# Patient Record
Sex: Female | Born: 1940
Health system: Southern US, Community
[De-identification: ages and names within clinical notes are randomized; demographics above are authoritative.]

## PROBLEM LIST (undated history)

## (undated) DIAGNOSIS — I5032 Chronic diastolic (congestive) heart failure: Secondary | ICD-10-CM

## (undated) DIAGNOSIS — K219 Gastro-esophageal reflux disease without esophagitis: Secondary | ICD-10-CM

## (undated) DIAGNOSIS — G4730 Sleep apnea, unspecified: Secondary | ICD-10-CM

## (undated) DIAGNOSIS — D72829 Elevated white blood cell count, unspecified: Secondary | ICD-10-CM

## (undated) DIAGNOSIS — E1159 Type 2 diabetes mellitus with other circulatory complications: Secondary | ICD-10-CM

## (undated) DIAGNOSIS — R6510 Systemic inflammatory response syndrome (SIRS) of non-infectious origin without acute organ dysfunction: Secondary | ICD-10-CM

## (undated) DIAGNOSIS — M25552 Pain in left hip: Secondary | ICD-10-CM

## (undated) DIAGNOSIS — I219 Acute myocardial infarction, unspecified: Secondary | ICD-10-CM

## (undated) DIAGNOSIS — N179 Acute kidney failure, unspecified: Secondary | ICD-10-CM

## (undated) DIAGNOSIS — I2510 Atherosclerotic heart disease of native coronary artery without angina pectoris: Secondary | ICD-10-CM

## (undated) DIAGNOSIS — I10 Essential (primary) hypertension: Secondary | ICD-10-CM

## (undated) DIAGNOSIS — C801 Malignant (primary) neoplasm, unspecified: Secondary | ICD-10-CM

## (undated) DIAGNOSIS — E785 Hyperlipidemia, unspecified: Secondary | ICD-10-CM

## (undated) DIAGNOSIS — M1990 Unspecified osteoarthritis, unspecified site: Secondary | ICD-10-CM

## (undated) DIAGNOSIS — I509 Heart failure, unspecified: Secondary | ICD-10-CM

## (undated) DIAGNOSIS — R404 Transient alteration of awareness: Secondary | ICD-10-CM

## (undated) DIAGNOSIS — E782 Mixed hyperlipidemia: Secondary | ICD-10-CM

## (undated) HISTORY — DX: Acute kidney failure, unspecified: N17.9

## (undated) HISTORY — DX: Transient alteration of awareness: R40.4

## (undated) HISTORY — DX: Chronic diastolic (congestive) heart failure: I50.32

## (undated) HISTORY — DX: Systemic inflammatory response syndrome (sirs) of non-infectious origin without acute organ dysfunction: R65.10

## (undated) HISTORY — DX: Mixed hyperlipidemia: E78.2

## (undated) HISTORY — DX: Elevated white blood cell count, unspecified: D72.829

## (undated) HISTORY — DX: Essential (primary) hypertension: I10

## (undated) HISTORY — DX: Hypercalcemia: E83.52

## (undated) HISTORY — DX: Type 2 diabetes mellitus with other circulatory complications: E11.59

## (undated) HISTORY — DX: Atherosclerotic heart disease of native coronary artery without angina pectoris: I25.10

## (undated) HISTORY — DX: Pain in left hip: M25.552

---
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
## 1998-08-07 HISTORY — PX: ABDOMINAL HYSTERECTOMY: SHX81

## 1999-01-12 ENCOUNTER — Other Ambulatory Visit: Admission: RE | Admit: 1999-01-12 | Discharge: 1999-01-12 | Payer: Self-pay | Admitting: Obstetrics and Gynecology

## 1999-02-01 ENCOUNTER — Other Ambulatory Visit: Admission: RE | Admit: 1999-02-01 | Discharge: 1999-02-01 | Payer: Self-pay | Admitting: Obstetrics and Gynecology

## 1999-02-01 ENCOUNTER — Encounter (INDEPENDENT_AMBULATORY_CARE_PROVIDER_SITE_OTHER): Payer: Self-pay | Admitting: Specialist

## 1999-03-03 ENCOUNTER — Encounter: Payer: Self-pay | Admitting: Obstetrics and Gynecology

## 1999-03-08 ENCOUNTER — Encounter (INDEPENDENT_AMBULATORY_CARE_PROVIDER_SITE_OTHER): Payer: Self-pay | Admitting: Specialist

## 1999-03-08 ENCOUNTER — Inpatient Hospital Stay (HOSPITAL_COMMUNITY): Admission: RE | Admit: 1999-03-08 | Discharge: 1999-03-11 | Payer: Self-pay | Admitting: Obstetrics and Gynecology

## 1999-03-08 ENCOUNTER — Encounter (INDEPENDENT_AMBULATORY_CARE_PROVIDER_SITE_OTHER): Payer: Self-pay

## 1999-10-19 ENCOUNTER — Other Ambulatory Visit: Admission: RE | Admit: 1999-10-19 | Discharge: 1999-10-19 | Payer: Self-pay | Admitting: Obstetrics and Gynecology

## 2000-04-18 ENCOUNTER — Other Ambulatory Visit: Admission: RE | Admit: 2000-04-18 | Discharge: 2000-04-18 | Payer: Self-pay | Admitting: Obstetrics and Gynecology

## 2000-06-22 ENCOUNTER — Encounter (INDEPENDENT_AMBULATORY_CARE_PROVIDER_SITE_OTHER): Payer: Self-pay | Admitting: Specialist

## 2000-06-22 ENCOUNTER — Ambulatory Visit (HOSPITAL_COMMUNITY): Admission: RE | Admit: 2000-06-22 | Discharge: 2000-06-22 | Payer: Self-pay | Admitting: Gastroenterology

## 2000-07-20 ENCOUNTER — Ambulatory Visit (HOSPITAL_COMMUNITY): Admission: RE | Admit: 2000-07-20 | Discharge: 2000-07-20 | Payer: Self-pay | Admitting: Gastroenterology

## 2000-10-15 ENCOUNTER — Other Ambulatory Visit: Admission: RE | Admit: 2000-10-15 | Discharge: 2000-10-15 | Payer: Self-pay | Admitting: Obstetrics and Gynecology

## 2001-04-29 ENCOUNTER — Other Ambulatory Visit: Admission: RE | Admit: 2001-04-29 | Discharge: 2001-04-29 | Payer: Self-pay | Admitting: Obstetrics and Gynecology

## 2001-10-17 ENCOUNTER — Other Ambulatory Visit: Admission: RE | Admit: 2001-10-17 | Discharge: 2001-10-17 | Payer: Self-pay | Admitting: Obstetrics and Gynecology

## 2002-05-16 ENCOUNTER — Other Ambulatory Visit: Admission: RE | Admit: 2002-05-16 | Discharge: 2002-05-16 | Payer: Self-pay | Admitting: Obstetrics and Gynecology

## 2002-11-18 ENCOUNTER — Other Ambulatory Visit: Admission: RE | Admit: 2002-11-18 | Discharge: 2002-11-18 | Payer: Self-pay | Admitting: Obstetrics and Gynecology

## 2003-05-27 ENCOUNTER — Other Ambulatory Visit: Admission: RE | Admit: 2003-05-27 | Discharge: 2003-05-27 | Payer: Self-pay | Admitting: Obstetrics and Gynecology

## 2003-08-08 HISTORY — PX: OTHER SURGICAL HISTORY: SHX169

## 2003-11-26 ENCOUNTER — Other Ambulatory Visit: Admission: RE | Admit: 2003-11-26 | Discharge: 2003-11-26 | Payer: Self-pay | Admitting: Obstetrics and Gynecology

## 2004-08-25 ENCOUNTER — Other Ambulatory Visit: Admission: RE | Admit: 2004-08-25 | Discharge: 2004-08-25 | Payer: Self-pay | Admitting: Obstetrics and Gynecology

## 2005-02-04 DIAGNOSIS — I219 Acute myocardial infarction, unspecified: Secondary | ICD-10-CM

## 2005-02-04 HISTORY — DX: Acute myocardial infarction, unspecified: I21.9

## 2005-11-16 ENCOUNTER — Other Ambulatory Visit: Admission: RE | Admit: 2005-11-16 | Discharge: 2005-11-16 | Payer: Self-pay | Admitting: Obstetrics and Gynecology

## 2010-08-07 HISTORY — PX: CORONARY ANGIOPLASTY: SHX604

## 2010-08-07 HISTORY — PX: OTHER SURGICAL HISTORY: SHX169

## 2010-12-23 NOTE — Procedures (Signed)
[facility]  Patient:    [name], [name]                  MRN: [id number] Adm. Date:  [id number] Disc. Date: [id number] Attending:  Deneen Harts CC:         Nadine Counts, M.D., [place], [place]   Procedure Report  PROCEDURE:  Panendoscopy with biopsy.  ENDOSCOPIST:  Griffith Citron, M.D.  INDICATION:  [age]-year-old white female undergoing upper endoscopy to evaluate symptoms of pyrosis exacerbated with citrus juice; also with frequent regurgitation and vomiting, especially after drinking water or orange juice.  DESCRIPTION OF PROCEDURE:  After reviewing the nature of the procedure with the patient including potential risks and complications and after discussing alternative methods of diagnosis and treatment, informed consent was signed.  Patient was premedicated, receiving topical anesthetic to the posterior oropharynx, followed by IV sedation totalling Versed 6 mg, Fentanyl 50 mcg, administered in divided doses prior to and during the course of the procedure.  Using an Olympus video endoscope, proximal esophagus was intubated under direct vision.  The oropharynx was normal without lesion of the epiglottis, vocal cords and piriform sinus.  The proximal, mid and distal segments of the esophagus were entirely normal.  The mucosal Z-line was distinct at 36 cm. There was no evidence of hiatal hernia.  The esophageal mucosa was noninflamed.  No evidence to suggest reflux-induced mucosal inflammation.  The gastric lumen was intubated, revealing a normal cardia and fundus.  The body and antrum were remarkable for erythematous mucosa with multiple erosions and scattered coffee-grounds exudate; in addition, the mucosa appeared somewhat mottled.  Biopsies were obtained from the angularis and prepyloric antrum for Helicobacter.  The pylorus was symmetric.  Duodenal bulb and second portion were normal.  Retroflexed view of the angularis, lesser curve,  gastric cardia and fundus revealed no additional findings; photo-documentation was obtained.  The stomach was decompressed and scope withdrawn.  Patient tolerated the procedure without difficulty, being maintained on Datascope monitor and low-flow oxygen throughout.  ASSESSMENT 1. Erosive gastritis -- primarily antral, with lesser involvement of the    gastric body.  Helicobacter biopsies obtained. 2. No evidence of gastroesophageal acid reflux disease.  RECOMMENDATION 1. Treat if Helicobacter positive. 2. Avoid NSAIDs and minimize aspirin use as much as possible. 3. Antireflux measures. 4. Protonix 40 mg, one capsule one hour prior to breakfast daily for    four to six weeks. DD:  07/20/00 TD:  07/22/00 Job: [id number]

## 2012-01-25 ENCOUNTER — Encounter (HOSPITAL_COMMUNITY): Payer: Self-pay | Admitting: Pharmacy Technician

## 2012-01-30 ENCOUNTER — Ambulatory Visit (HOSPITAL_COMMUNITY)
Admission: RE | Admit: 2012-01-30 | Discharge: 2012-01-30 | Disposition: A | Discharge status: Home / Self Care | Payer: Medicare Other | Source: Ambulatory Visit | Attending: Orthopedic Surgery | Admitting: Orthopedic Surgery

## 2012-01-30 ENCOUNTER — Encounter (HOSPITAL_COMMUNITY)
Admission: RE | Admit: 2012-01-30 | Discharge: 2012-01-30 | Disposition: A | Discharge status: Home / Self Care | Payer: Medicare Other | Source: Ambulatory Visit | Attending: Orthopedic Surgery | Admitting: Orthopedic Surgery

## 2012-01-30 ENCOUNTER — Encounter (HOSPITAL_COMMUNITY): Payer: Self-pay

## 2012-01-30 DIAGNOSIS — I2510 Atherosclerotic heart disease of native coronary artery without angina pectoris: Secondary | ICD-10-CM | POA: Insufficient documentation

## 2012-01-30 DIAGNOSIS — I10 Essential (primary) hypertension: Secondary | ICD-10-CM | POA: Insufficient documentation

## 2012-01-30 DIAGNOSIS — E119 Type 2 diabetes mellitus without complications: Secondary | ICD-10-CM | POA: Insufficient documentation

## 2012-01-30 DIAGNOSIS — Z01812 Encounter for preprocedural laboratory examination: Secondary | ICD-10-CM | POA: Insufficient documentation

## 2012-01-30 DIAGNOSIS — Z01818 Encounter for other preprocedural examination: Secondary | ICD-10-CM | POA: Insufficient documentation

## 2012-01-30 HISTORY — DX: Essential (primary) hypertension: I10

## 2012-01-30 HISTORY — DX: Sleep apnea, unspecified: G47.30

## 2012-01-30 HISTORY — DX: Atherosclerotic heart disease of native coronary artery without angina pectoris: I25.10

## 2012-01-30 HISTORY — DX: Hyperlipidemia, unspecified: E78.5

## 2012-01-30 HISTORY — DX: Acute myocardial infarction, unspecified: I21.9

## 2012-01-30 HISTORY — DX: Gastro-esophageal reflux disease without esophagitis: K21.9

## 2012-01-30 HISTORY — DX: Unspecified osteoarthritis, unspecified site: M19.90

## 2012-01-30 HISTORY — DX: Malignant (primary) neoplasm, unspecified: C80.1

## 2012-01-30 LAB — BASIC METABOLIC PANEL
BUN: 22 mg/dL (ref 6–23)
CO2: 25 mEq/L (ref 19–32)
Chloride: 102 mEq/L (ref 96–112)
Glucose, Bld: 114 mg/dL — ABNORMAL HIGH (ref 70–99)
Potassium: 4.2 mEq/L (ref 3.5–5.1)
Sodium: 141 mEq/L (ref 135–145)

## 2012-01-30 LAB — URINALYSIS, ROUTINE W REFLEX MICROSCOPIC
Leukocytes, UA: NEGATIVE
Nitrite: NEGATIVE
Specific Gravity, Urine: 1.023 (ref 1.005–1.030)
Urobilinogen, UA: 0.2 mg/dL (ref 0.0–1.0)
pH: 5.5 (ref 5.0–8.0)

## 2012-01-30 LAB — CBC
HCT: 40.2 % (ref 36.0–46.0)
Hemoglobin: 12.6 g/dL (ref 12.0–15.0)
MCHC: 31.3 g/dL (ref 30.0–36.0)
RBC: 5.02 MIL/uL (ref 3.87–5.11)
WBC: 12.2 10*3/uL — ABNORMAL HIGH (ref 4.0–10.5)

## 2012-01-30 LAB — PROTIME-INR: INR: 1.06 (ref 0.00–1.49)

## 2012-01-30 LAB — SURGICAL PCR SCREEN: Staphylococcus aureus: NEGATIVE

## 2012-01-30 LAB — APTT: aPTT: 34 seconds (ref 24–37)

## 2012-01-30 NOTE — Pre-Procedure Instructions (Signed)
PT HAS NOTE OF MEDICAL CLEARANCE WITH OFFICE NOTE FOR LEFT TKA--NOTES FROM AMY MOON, NP WITH [facility], [place]. PT HAS NOTE OF CARDIAC CLEARANCE WITH EKG AND CARDIOLOGY OFFICE NOTE 12/06/11  - FOR LEFT TKA- - FROM DR. MUNLEY WITH [facility], [place]. CBC, BMET, PT, PTT, UA AND CXR WERE DONE TODAY AT [facility] -AS PER ORDERS DR. OLIN AND ANESTHESIOLOGIST'S GUIDELINES.  T/S TO BE DRAWN THE DAY OF SURGERY. PREOP INSTRUCTIONS DISCUSSED WITH PT USING TEACH BACK METHOD.

## 2012-01-30 NOTE — Patient Instructions (Signed)
YOUR SURGERY IS SCHEDULED ON:  Monday  7/1  AT 1:40 PM  REPORT TO [facility] AT:  11:15 AM      PHONE # FOR SHORT STAY IS [phone]  DO NOT EAT ANYTHING AFTER MIDNIGHT THE NIGHT BEFORE YOUR SURGERY.  NO FOOD, NO CHEWING GUM, NO MINTS, NO CANDIES, NO CHEWING TOBACCO.  YOU MAY HAVE CLEAR LIQUID TO DRINK FROM MIDNIGHT UNTIL 7:30 AM THE DAY OF YOUR SURGERY - APPLE OR GRAPE JUICE, BLACK COFFEE, WATER.  NOTHING TO DRINK  AFTER 7:30 AM THE DAY OF YOUR SURGERY.  PLEASE TAKE THE FOLLOWING MEDICATIONS THE AM OF YOUR SURGERY WITH A FEW SIPS OF WATER:  AMLODIPINE, ISOSORBIDE, METOPROLOL, PANTOPRAZOLE    IF YOU USE INHALERS--USE YOUR INHALERS THE AM OF YOUR SURGERY AND BRING INHALERS TO THE HOSPITAL -TAKE TO SURGERY.    IF YOU ARE DIABETIC:  DO NOT TAKE ANY DIABETIC MEDICATIONS THE AM OF YOUR SURGERY.  IF YOU TAKE INSULIN IN THE EVENINGS--PLEASE ONLY TAKE 1/2 NORMAL EVENING DOSE THE NIGHT BEFORE YOUR SURGERY.  NO INSULIN THE AM OF YOUR SURGERY.  IF YOU HAVE SLEEP APNEA AND USE CPAP OR BIPAP--PLEASE BRING THE MASK --NOT THE MACHINE-NOT THE TUBING   -JUST THE MASK. DO NOT BRING VALUABLES, MONEY, CREDIT CARDS.  CONTACT LENS, DENTURES / PARTIALS, GLASSES SHOULD NOT BE WORN TO SURGERY AND IN MOST CASES-HEARING AIDS WILL NEED TO BE REMOVED.  BRING YOUR GLASSES CASE, ANY EQUIPMENT NEEDED FOR YOUR CONTACT LENS. FOR PATIENTS ADMITTED TO THE HOSPITAL--CHECK OUT TIME THE DAY OF DISCHARGE IS 11:00 AM.  ALL INPATIENT ROOMS ARE PRIVATE - WITH BATHROOM, TELEPHONE, TELEVISION AND WIFI INTERNET. IF YOU ARE BEING DISCHARGED THE SAME DAY OF YOUR SURGERY--YOU CAN NOT DRIVE YOURSELF HOME--AND SHOULD NOT GO HOME ALONE BY TAXI OR BUS.  NO DRIVING OR OPERATING MACHINERY FOR 24 HOURS FOLLOWING ANESTHESIA / PAIN MEDICATIONS.                            SPECIAL INSTRUCTIONS:  CHLORHEXIDINE SOAP SHOWER (other brand names are Betasept and Hibiclens ) PLEASE SHOWER WITH CHLORHEXIDINE THE NIGHT BEFORE YOUR SURGERY  AND THE AM OF YOUR SURGERY. DO NOT USE CHLORHEXIDINE ON YOUR FACE OR PRIVATE AREAS--YOU MAY USE YOUR NORMAL SOAP THOSE AREAS AND YOUR NORMAL SHAMPOO.  WOMEN SHOULD AVOID SHAVING UNDER ARMS AND SHAVING LEGS 48 HOURS BEFORE USING CHLORHEXIDINE TO AVOID SKIN IRRITATION.  DO NOT USE IF ALLERGIC TO CHLORHEXIDINE.  PLEASE READ OVER ANY  FACT SHEETS THAT YOU WERE GIVEN: MRSA INFORMATION, BLOOD TRANSFUSION INFORMATION, INCENTIVE SPIROMETER INFORMATION.

## 2012-01-31 NOTE — Progress Notes (Signed)
H&P performed 01/31/12 Dictation # [id number]

## 2012-02-01 NOTE — H&P (Signed)
NAME:  [place], [name]                ACCOUNT NO.:  [id number]  MEDICAL RECORD NO.:  [id number]  LOCATION:  [facility]                         FACILITY:  [facility]  PHYSICIAN:  Madlyn Frankel. Charlann Boxer, M.D.  DATE OF BIRTH:  09-15-1940  DATE OF ADMISSION:  01/30/2012 DATE OF DISCHARGE:  01/30/2012                             HISTORY & PHYSICAL  Pt Has Sleep Apnea but does not use her CPAP. Pt needs continuous pulse oximetry post op. DATE OF SURGERY:  February 05, 2012.  ADMITTING DIAGNOSIS:  End-stage osteoarthritis of the left knee.  HISTORY OF PRESENT ILLNESS:  This is a 71 year old lady with a history of end-stage osteoarthritis of the left knee that has failed conservative treatment.  After discussion of treatment, benefits, risks, and options, the patient is now scheduled for total knee arthroplasty of the left knee.  Note that the patient is not a candidate for tranexamic acid or dexamethasone and will not receive either at surgery.  Her medical doctor is Gaye Alken, nurse practitioner, and her cardiologist is Dr. Norman Herrlich.  She is given medications of aspirin, iron, Robaxin, MiraLax, and Colace for postoperative medications and she plans on going home after surgery.  PAST MEDICAL HISTORY:  Drug allergies:  None.  CURRENT MEDICATIONS: 1. Isosorbide 60 mg 1 daily. 2. Aspirin 81 mg 2 daily. 3. Metoprolol tartrate 50 mg b.i.d. 4. Losartan/hydrochlorothiazide 100/25 mg 1 daily. 5. Amlodipine 10 mg 1 daily. 6. Metformin 1000 mg b.i.d. 7. Glimepiride 4 mg b.i.d. 8. Pantoprazole 40 mg 1 daily. 9. Pravastatin 40 mg 1 at bedtime.  SERIOUS MEDICAL ILLNESSES:  Include hypertension, hyperlipidemia, coronary artery disease, diabetes, reflux, and sleep apnea.  She also has history of cardiac stent with MI.  Also medical illnesses include history of uterine cancer in 2000.  SURGERIES:  Include complete hysterectomy, cardiac stent, and knee arthroscopy with debridement.  FAMILY HISTORY:  Positive for  atrial fibrillation and hypertension.  SOCIAL HISTORY:  The patient is married.  She is retired.  She does not smoke and does not drink and again plans on going home after surgery.  REVIEW OF SYSTEMS:  CENTRAL NERVOUS SYSTEM:  Negative for headache, blurred vision, or dizziness.  PULMONARY:  Positive for sleep apnea. She is supposed to use a CPAP but has not from years.  Also history of pneumonia.  Positive for exertional shortness of breath.  Negative for PND and orthopnea.  CARDIOVASCULAR:  Negative for chest pain or palpitations.  Positive for history of MI with stent placement.  GI: Positive for reflux.  GU:  Negative for urinary tract difficulty. MUSCULOSKELETAL:  Positive as in HPI.  PHYSICAL EXAMINATION:  VITAL SIGNS:  BP 131/61, pulse 64 and regular, respirations 14. HEENT:  Head normocephalic.  Nose patent.  Ears patent.  Pupils equal, round, and reactive to light.  Throat without injection. NECK:  Supple without adenopathy.  Carotids 2+ without bruit. CHEST:  Clear to auscultation.  No rales or rhonchi.  Respirations 14. HEART:  Regular rate and rhythm at 64 beats per minute without murmur. ABDOMEN:  Soft with active bowel sounds.  No masses or organomegaly. NEUROLOGIC:  The patient is alert and oriented to time, place, and  person.  Cranial nerves II-XII grossly intact. EXTREMITIES:  Shows left knee with full extension, further flexion to 120 degrees.  Neurovascular status intact.  ASSESSMENT:  End-stage osteoarthritis, left knee.  PLAN:  Total knee arthroplasty, left knee.     Jaquelyn Bitter. Omega Slager, P.A.   ______________________________ Madlyn Frankel Charlann Boxer, M.D.    SJC/MEDQ  D:  01/31/2012  T:  01/31/2012  Job:  [id number]

## 2012-02-05 ENCOUNTER — Encounter (HOSPITAL_COMMUNITY): Payer: Self-pay | Admitting: Anesthesiology

## 2012-02-05 ENCOUNTER — Encounter (HOSPITAL_COMMUNITY)
Admission: RE | Disposition: A | Discharge status: Home Health | Payer: Self-pay | Source: Ambulatory Visit | Attending: Orthopedic Surgery

## 2012-02-05 ENCOUNTER — Inpatient Hospital Stay (HOSPITAL_COMMUNITY)
Admission: RE | Admit: 2012-02-05 | Discharge: 2012-02-07 | DRG: 470 | Disposition: A | Discharge status: Home Health | Payer: Medicare Other | Source: Ambulatory Visit | Attending: Orthopedic Surgery | Admitting: Orthopedic Surgery

## 2012-02-05 ENCOUNTER — Encounter (HOSPITAL_COMMUNITY): Payer: Self-pay | Admitting: *Deleted

## 2012-02-05 ENCOUNTER — Ambulatory Visit (HOSPITAL_COMMUNITY): Payer: Medicare Other | Admitting: Anesthesiology

## 2012-02-05 DIAGNOSIS — K219 Gastro-esophageal reflux disease without esophagitis: Secondary | ICD-10-CM | POA: Diagnosis present

## 2012-02-05 DIAGNOSIS — Z9861 Coronary angioplasty status: Secondary | ICD-10-CM

## 2012-02-05 DIAGNOSIS — I10 Essential (primary) hypertension: Secondary | ICD-10-CM | POA: Diagnosis present

## 2012-02-05 DIAGNOSIS — I252 Old myocardial infarction: Secondary | ICD-10-CM

## 2012-02-05 DIAGNOSIS — E785 Hyperlipidemia, unspecified: Secondary | ICD-10-CM | POA: Diagnosis present

## 2012-02-05 DIAGNOSIS — M1710 Unilateral primary osteoarthritis, unspecified knee: Principal | ICD-10-CM | POA: Diagnosis present

## 2012-02-05 DIAGNOSIS — I2510 Atherosclerotic heart disease of native coronary artery without angina pectoris: Secondary | ICD-10-CM | POA: Diagnosis present

## 2012-02-05 DIAGNOSIS — G4730 Sleep apnea, unspecified: Secondary | ICD-10-CM | POA: Diagnosis present

## 2012-02-05 DIAGNOSIS — E119 Type 2 diabetes mellitus without complications: Secondary | ICD-10-CM | POA: Diagnosis present

## 2012-02-05 DIAGNOSIS — Z96659 Presence of unspecified artificial knee joint: Secondary | ICD-10-CM

## 2012-02-05 HISTORY — PX: TOTAL KNEE ARTHROPLASTY: SHX125

## 2012-02-05 LAB — TYPE AND SCREEN: ABO/RH(D): O POS

## 2012-02-05 LAB — GLUCOSE, CAPILLARY: Glucose-Capillary: 190 mg/dL — ABNORMAL HIGH (ref 70–99)

## 2012-02-05 LAB — ABO/RH: ABO/RH(D): O POS

## 2012-02-05 SURGERY — ARTHROPLASTY, KNEE, TOTAL
Anesthesia: Spinal | Site: Knee | Laterality: Left | Wound class: Clean

## 2012-02-05 MED ORDER — FERROUS SULFATE 325 (65 FE) MG PO TABS
325.0000 mg | ORAL_TABLET | Freq: Three times a day (TID) | ORAL | Status: DC
  Administered 2012-02-05 – 2012-02-07 (×5): 325 mg via ORAL
  Filled 2012-02-05 (×8): qty 1

## 2012-02-05 MED ORDER — CEFAZOLIN SODIUM 1-5 GM-% IV SOLN: 1.0000 g | INTRAVENOUS | Status: DC

## 2012-02-05 MED ORDER — FLEET ENEMA 7-19 GM/118ML RE ENEM: 1.0000 | ENEMA | Freq: Once | RECTAL | Status: AC | PRN

## 2012-02-05 MED ORDER — MENTHOL 3 MG MT LOZG: 1.0000 | LOZENGE | OROMUCOSAL | Status: DC | PRN

## 2012-02-05 MED ORDER — CEFAZOLIN SODIUM 1-5 GM-% IV SOLN
INTRAVENOUS | Status: DC | PRN
  Administered 2012-02-05: 1 g via INTRAVENOUS

## 2012-02-05 MED ORDER — ONDANSETRON HCL 4 MG/2ML IJ SOLN
INTRAMUSCULAR | Status: DC | PRN
  Administered 2012-02-05: 4 mg via INTRAVENOUS

## 2012-02-05 MED ORDER — MIDAZOLAM HCL 5 MG/5ML IJ SOLN
INTRAMUSCULAR | Status: DC | PRN
  Administered 2012-02-05 (×2): 1 mg via INTRAVENOUS
  Administered 2012-02-05: 2 mg via INTRAVENOUS

## 2012-02-05 MED ORDER — BUPIVACAINE HCL 0.75 % IJ SOLN
INTRAMUSCULAR | Status: DC | PRN
  Administered 2012-02-05: 15 mg via INTRATHECAL

## 2012-02-05 MED ORDER — ACETAMINOPHEN 10 MG/ML IV SOLN
INTRAVENOUS | Status: DC | PRN
  Administered 2012-02-05: 1000 mg via INTRAVENOUS

## 2012-02-05 MED ORDER — HYDROMORPHONE HCL PF 1 MG/ML IJ SOLN
0.5000 mg | INTRAMUSCULAR | Status: DC | PRN
  Administered 2012-02-07 (×2): 1 mg via INTRAVENOUS
  Filled 2012-02-05 (×2): qty 1

## 2012-02-05 MED ORDER — POLYETHYLENE GLYCOL 3350 17 G PO PACK
17.0000 g | PACK | Freq: Two times a day (BID) | ORAL | Status: DC
  Administered 2012-02-05 – 2012-02-07 (×3): 17 g via ORAL

## 2012-02-05 MED ORDER — ONDANSETRON HCL 4 MG/2ML IJ SOLN: 4.0000 mg | Freq: Four times a day (QID) | INTRAMUSCULAR | Status: DC | PRN

## 2012-02-05 MED ORDER — DOCUSATE SODIUM 100 MG PO CAPS
100.0000 mg | ORAL_CAPSULE | Freq: Two times a day (BID) | ORAL | Status: DC
  Administered 2012-02-05 – 2012-02-07 (×4): 100 mg via ORAL

## 2012-02-05 MED ORDER — HYDROCHLOROTHIAZIDE 25 MG PO TABS
25.0000 mg | ORAL_TABLET | Freq: Every day | ORAL | Status: DC
  Administered 2012-02-06 – 2012-02-07 (×2): 25 mg via ORAL
  Filled 2012-02-05 (×2): qty 1

## 2012-02-05 MED ORDER — SIMVASTATIN 20 MG PO TABS
20.0000 mg | ORAL_TABLET | Freq: Every day | ORAL | Status: DC
  Administered 2012-02-06: 20 mg via ORAL
  Filled 2012-02-05 (×2): qty 1

## 2012-02-05 MED ORDER — KETOROLAC TROMETHAMINE 30 MG/ML IJ SOLN
INTRAMUSCULAR | Status: DC | PRN
  Administered 2012-02-05: 30 mg

## 2012-02-05 MED ORDER — LACTATED RINGERS IV SOLN
INTRAVENOUS | Status: DC
  Administered 2012-02-05: 1000 mL via INTRAVENOUS

## 2012-02-05 MED ORDER — ASPIRIN EC 81 MG PO TBEC
162.0000 mg | DELAYED_RELEASE_TABLET | Freq: Every day | ORAL | Status: DC
  Administered 2012-02-05 – 2012-02-07 (×3): 162 mg via ORAL
  Filled 2012-02-05 (×3): qty 2

## 2012-02-05 MED ORDER — CHLORHEXIDINE GLUCONATE 4 % EX LIQD
60.0000 mL | Freq: Once | CUTANEOUS | Status: DC
  Filled 2012-02-05: qty 60

## 2012-02-05 MED ORDER — HYDROMORPHONE HCL PF 1 MG/ML IJ SOLN
INTRAMUSCULAR | Status: DC | PRN
  Administered 2012-02-05 (×4): 0.5 mg via INTRAVENOUS

## 2012-02-05 MED ORDER — BUPIVACAINE-EPINEPHRINE PF 0.25-1:200000 % IJ SOLN
INTRAMUSCULAR | Status: AC
  Filled 2012-02-05: qty 30

## 2012-02-05 MED ORDER — PANTOPRAZOLE SODIUM 40 MG PO TBEC
40.0000 mg | DELAYED_RELEASE_TABLET | Freq: Every day | ORAL | Status: DC
  Administered 2012-02-06 – 2012-02-07 (×2): 40 mg via ORAL
  Filled 2012-02-05 (×4): qty 1

## 2012-02-05 MED ORDER — ACETAMINOPHEN 650 MG RE SUPP: 650.0000 mg | Freq: Four times a day (QID) | RECTAL | Status: DC | PRN

## 2012-02-05 MED ORDER — PROMETHAZINE HCL 25 MG/ML IJ SOLN: 6.2500 mg | INTRAMUSCULAR | Status: DC | PRN

## 2012-02-05 MED ORDER — ISOSORBIDE MONONITRATE ER 60 MG PO TB24
60.0000 mg | ORAL_TABLET | Freq: Every day | ORAL | Status: DC
  Administered 2012-02-06 – 2012-02-07 (×2): 60 mg via ORAL
  Filled 2012-02-05 (×2): qty 1

## 2012-02-05 MED ORDER — PROPOFOL 10 MG/ML IV BOLUS
INTRAVENOUS | Status: DC | PRN
  Administered 2012-02-05: 120 mg via INTRAVENOUS

## 2012-02-05 MED ORDER — CEFAZOLIN SODIUM 1-5 GM-% IV SOLN
1.0000 g | Freq: Four times a day (QID) | INTRAVENOUS | Status: AC
  Administered 2012-02-05 – 2012-02-06 (×2): 1 g via INTRAVENOUS
  Filled 2012-02-05 (×2): qty 50

## 2012-02-05 MED ORDER — LACTATED RINGERS IV SOLN
INTRAVENOUS | Status: DC | PRN
  Administered 2012-02-05 (×2): via INTRAVENOUS

## 2012-02-05 MED ORDER — ACETAMINOPHEN 10 MG/ML IV SOLN
INTRAVENOUS | Status: AC
  Filled 2012-02-05: qty 100

## 2012-02-05 MED ORDER — BUPIVACAINE-EPINEPHRINE PF 0.25-1:200000 % IJ SOLN
INTRAMUSCULAR | Status: DC | PRN
  Administered 2012-02-05: 60 mL

## 2012-02-05 MED ORDER — AMLODIPINE BESYLATE 10 MG PO TABS
10.0000 mg | ORAL_TABLET | Freq: Every day | ORAL | Status: DC
  Administered 2012-02-05 – 2012-02-07 (×3): 10 mg via ORAL
  Filled 2012-02-05 (×4): qty 1

## 2012-02-05 MED ORDER — METHOCARBAMOL 500 MG PO TABS
500.0000 mg | ORAL_TABLET | Freq: Four times a day (QID) | ORAL | Status: DC | PRN
  Administered 2012-02-06 – 2012-02-07 (×2): 500 mg via ORAL
  Filled 2012-02-05 (×2): qty 1

## 2012-02-05 MED ORDER — GLIMEPIRIDE 4 MG PO TABS
4.0000 mg | ORAL_TABLET | Freq: Two times a day (BID) | ORAL | Status: DC
  Administered 2012-02-05 – 2012-02-07 (×4): 4 mg via ORAL
  Filled 2012-02-05 (×5): qty 1

## 2012-02-05 MED ORDER — METOCLOPRAMIDE HCL 10 MG PO TABS: 5.0000 mg | ORAL_TABLET | Freq: Three times a day (TID) | ORAL | Status: DC | PRN

## 2012-02-05 MED ORDER — KETOROLAC TROMETHAMINE 15 MG/ML IJ SOLN
7.5000 mg | Freq: Four times a day (QID) | INTRAMUSCULAR | Status: AC
  Administered 2012-02-05 – 2012-02-06 (×3): 7.5 mg via INTRAVENOUS
  Filled 2012-02-05 (×4): qty 1

## 2012-02-05 MED ORDER — METOPROLOL TARTRATE 50 MG PO TABS
50.0000 mg | ORAL_TABLET | Freq: Two times a day (BID) | ORAL | Status: DC
  Administered 2012-02-05 – 2012-02-07 (×4): 50 mg via ORAL
  Filled 2012-02-05 (×5): qty 1

## 2012-02-05 MED ORDER — HYDROMORPHONE HCL PF 1 MG/ML IJ SOLN: 0.2500 mg | INTRAMUSCULAR | Status: DC | PRN

## 2012-02-05 MED ORDER — CEFAZOLIN SODIUM 1-5 GM-% IV SOLN
INTRAVENOUS | Status: AC
  Filled 2012-02-05: qty 50

## 2012-02-05 MED ORDER — PHENYLEPHRINE HCL 10 MG/ML IJ SOLN
INTRAMUSCULAR | Status: DC | PRN
  Administered 2012-02-05: 40 ug via INTRAVENOUS
  Administered 2012-02-05: 20 ug via INTRAVENOUS

## 2012-02-05 MED ORDER — ACETAMINOPHEN 325 MG PO TABS: 650.0000 mg | ORAL_TABLET | Freq: Four times a day (QID) | ORAL | Status: DC | PRN

## 2012-02-05 MED ORDER — PHENOL 1.4 % MT LIQD: 1.0000 | OROMUCOSAL | Status: DC | PRN

## 2012-02-05 MED ORDER — METHOCARBAMOL 100 MG/ML IJ SOLN
500.0000 mg | Freq: Four times a day (QID) | INTRAVENOUS | Status: DC | PRN
  Filled 2012-02-05: qty 5

## 2012-02-05 MED ORDER — SODIUM CHLORIDE 0.9 % IV SOLN
INTRAVENOUS | Status: AC
  Administered 2012-02-06: 01:00:00 via INTRAVENOUS
  Filled 2012-02-05 (×4): qty 1000

## 2012-02-05 MED ORDER — DIPHENHYDRAMINE HCL 12.5 MG/5ML PO ELIX: 12.5000 mg | ORAL_SOLUTION | ORAL | Status: DC | PRN

## 2012-02-05 MED ORDER — HYDROCODONE-ACETAMINOPHEN 7.5-325 MG PO TABS
1.0000 | ORAL_TABLET | ORAL | Status: DC
  Administered 2012-02-05 – 2012-02-06 (×4): 1 via ORAL
  Administered 2012-02-06: 2 via ORAL
  Administered 2012-02-06 (×2): 1 via ORAL
  Administered 2012-02-07: 2 via ORAL
  Administered 2012-02-07: 1 via ORAL
  Filled 2012-02-05 (×5): qty 1
  Filled 2012-02-05: qty 2
  Filled 2012-02-05 (×2): qty 1
  Filled 2012-02-05: qty 2

## 2012-02-05 MED ORDER — ONDANSETRON HCL 4 MG PO TABS
4.0000 mg | ORAL_TABLET | Freq: Four times a day (QID) | ORAL | Status: DC | PRN
  Administered 2012-02-07: 4 mg via ORAL
  Filled 2012-02-05: qty 1

## 2012-02-05 MED ORDER — KETOROLAC TROMETHAMINE 30 MG/ML IJ SOLN
INTRAMUSCULAR | Status: AC
  Filled 2012-02-05: qty 1

## 2012-02-05 MED ORDER — METFORMIN HCL 500 MG PO TABS
1000.0000 mg | ORAL_TABLET | Freq: Two times a day (BID) | ORAL | Status: DC
  Administered 2012-02-06 – 2012-02-07 (×3): 1000 mg via ORAL
  Filled 2012-02-05 (×5): qty 2

## 2012-02-05 MED ORDER — RIVAROXABAN 10 MG PO TABS
10.0000 mg | ORAL_TABLET | Freq: Every day | ORAL | Status: DC
  Administered 2012-02-06 – 2012-02-07 (×2): 10 mg via ORAL
  Filled 2012-02-05 (×3): qty 1

## 2012-02-05 MED ORDER — ALUM & MAG HYDROXIDE-SIMETH 200-200-20 MG/5ML PO SUSP: 30.0000 mL | ORAL | Status: DC | PRN

## 2012-02-05 MED ORDER — METOCLOPRAMIDE HCL 5 MG/ML IJ SOLN: 5.0000 mg | Freq: Three times a day (TID) | INTRAMUSCULAR | Status: DC | PRN

## 2012-02-05 MED ORDER — LOSARTAN POTASSIUM 50 MG PO TABS
100.0000 mg | ORAL_TABLET | Freq: Every day | ORAL | Status: DC
  Administered 2012-02-06 – 2012-02-07 (×2): 100 mg via ORAL
  Filled 2012-02-05 (×2): qty 2

## 2012-02-05 MED ORDER — PROPOFOL 10 MG/ML IV EMUL
INTRAVENOUS | Status: DC | PRN
  Administered 2012-02-05: 100 ug/kg/min via INTRAVENOUS

## 2012-02-05 MED ORDER — LOSARTAN POTASSIUM-HCTZ 100-25 MG PO TABS: 1.0000 | ORAL_TABLET | Freq: Every day | ORAL | Status: DC

## 2012-02-05 SURGICAL SUPPLY — 57 items
BAG ZIPLOCK 12X15 (MISCELLANEOUS) ×2 IMPLANT
BANDAGE ELASTIC 6 VELCRO ST LF (GAUZE/BANDAGES/DRESSINGS) ×2 IMPLANT
BANDAGE ESMARK 6X9 LF (GAUZE/BANDAGES/DRESSINGS) ×1 IMPLANT
BLADE SAW SGTL 13.0X1.19X90.0M (BLADE) ×2 IMPLANT
BNDG ESMARK 6X9 LF (GAUZE/BANDAGES/DRESSINGS) ×2
BONE CEMENT GENTAMICIN (Cement) ×4 IMPLANT
BOWL SMART MIX CTS (DISPOSABLE) ×2 IMPLANT
CEMENT BONE GENTAMICIN 40 (Cement) ×2 IMPLANT
CLOTH BEACON ORANGE TIMEOUT ST (SAFETY) ×2 IMPLANT
CUFF TOURN SGL QUICK 34 (TOURNIQUET CUFF) ×2
CUFF TRNQT CYL 34X4X40X1 (TOURNIQUET CUFF) ×1 IMPLANT
DECANTER SPIKE VIAL GLASS SM (MISCELLANEOUS) ×4 IMPLANT
DERMABOND ADVANCED (GAUZE/BANDAGES/DRESSINGS) ×1
DERMABOND ADVANCED .7 DNX12 (GAUZE/BANDAGES/DRESSINGS) ×1 IMPLANT
DRAPE EXTREMITY T 121X128X90 (DRAPE) ×2 IMPLANT
DRAPE POUCH INSTRU U-SHP 10X18 (DRAPES) ×2 IMPLANT
DRAPE U-SHAPE 47X51 STRL (DRAPES) ×2 IMPLANT
DRSG AQUACEL AG ADV 3.5X10 (GAUZE/BANDAGES/DRESSINGS) ×2 IMPLANT
DRSG TEGADERM 4X4.75 (GAUZE/BANDAGES/DRESSINGS) ×2 IMPLANT
DRSG TEGADERM 8X12 (GAUZE/BANDAGES/DRESSINGS) ×2 IMPLANT
DURAPREP 26ML APPLICATOR (WOUND CARE) ×2 IMPLANT
ELECT REM PT RETURN 9FT ADLT (ELECTROSURGICAL) ×2
ELECTRODE REM PT RTRN 9FT ADLT (ELECTROSURGICAL) ×1 IMPLANT
EVACUATOR 1/8 PVC DRAIN (DRAIN) ×2 IMPLANT
FACESHIELD LNG OPTICON STERILE (SAFETY) ×10 IMPLANT
GAUZE SPONGE 2X2 8PLY STRL LF (GAUZE/BANDAGES/DRESSINGS) ×1 IMPLANT
GLOVE BIOGEL PI IND STRL 7.5 (GLOVE) ×1 IMPLANT
GLOVE BIOGEL PI IND STRL 8 (GLOVE) ×1 IMPLANT
GLOVE BIOGEL PI INDICATOR 7.5 (GLOVE) ×1
GLOVE BIOGEL PI INDICATOR 8 (GLOVE) ×1
GLOVE ECLIPSE 8.0 STRL XLNG CF (GLOVE) ×2 IMPLANT
GLOVE ORTHO TXT STRL SZ7.5 (GLOVE) ×4 IMPLANT
GOWN BRE IMP PREV XXLGXLNG (GOWN DISPOSABLE) ×2 IMPLANT
GOWN STRL NON-REIN LRG LVL3 (GOWN DISPOSABLE) ×2 IMPLANT
HANDPIECE INTERPULSE COAX TIP (DISPOSABLE) ×1
IMMOBILIZER KNEE 20 (SOFTGOODS) ×2
IMMOBILIZER KNEE 20 THIGH 36 (SOFTGOODS) ×1 IMPLANT
KIT BASIN OR (CUSTOM PROCEDURE TRAY) ×2 IMPLANT
MANIFOLD NEPTUNE II (INSTRUMENTS) ×2 IMPLANT
NDL SAFETY ECLIPSE 18X1.5 (NEEDLE) ×1 IMPLANT
NEEDLE HYPO 18GX1.5 SHARP (NEEDLE) ×2
NS IRRIG 1000ML POUR BTL (IV SOLUTION) ×4 IMPLANT
PACK TOTAL JOINT (CUSTOM PROCEDURE TRAY) ×2 IMPLANT
POSITIONER SURGICAL ARM (MISCELLANEOUS) ×2 IMPLANT
SET HNDPC FAN SPRY TIP SCT (DISPOSABLE) ×1 IMPLANT
SET PAD KNEE POSITIONER (MISCELLANEOUS) ×2 IMPLANT
SPONGE GAUZE 2X2 STER 10/PKG (GAUZE/BANDAGES/DRESSINGS) ×1
SUCTION FRAZIER 12FR DISP (SUCTIONS) ×2 IMPLANT
SUT MNCRL AB 4-0 PS2 18 (SUTURE) ×2 IMPLANT
SUT VIC AB 1 CT1 36 (SUTURE) ×6 IMPLANT
SUT VIC AB 2-0 CT1 27 (SUTURE) ×6
SUT VIC AB 2-0 CT1 TAPERPNT 27 (SUTURE) ×3 IMPLANT
SYR 50ML LL SCALE MARK (SYRINGE) ×2 IMPLANT
TOWEL OR 17X26 10 PK STRL BLUE (TOWEL DISPOSABLE) ×4 IMPLANT
TRAY FOLEY CATH 14FRSI W/METER (CATHETERS) ×2 IMPLANT
WATER STERILE IRR 1500ML POUR (IV SOLUTION) ×2 IMPLANT
WRAP KNEE MAXI GEL POST OP (GAUZE/BANDAGES/DRESSINGS) ×2 IMPLANT

## 2012-02-05 NOTE — Interval H&P Note (Signed)
History and Physical Interval Note:  02/05/2012 3:15 PM  Lindsey Frost  has presented today for surgery, with the diagnosis of osteoarthritis left knee  The various methods of treatment have been discussed with the patient and family. After consideration of risks, benefits and other options for treatment, the patient has consented to  Procedure(s) (LRB): LEFT TOTAL KNEE ARTHROPLASTY (Left) as a surgical intervention .  The patient's history has been reviewed, patient examined, no change in status, stable for surgery.  I have reviewed the patients' chart and labs.  Questions were answered to the patient's satisfaction.     [name]

## 2012-02-05 NOTE — Anesthesia Procedure Notes (Addendum)
Spinal  Patient location during procedure: OR Start time: 02/05/2012 3:35 PM End time: 02/05/2012 3:45 PM Staffing Anesthesiologist: Ronelle Nigh L Performed by: anesthesiologist  Preanesthetic Checklist Completed: patient identified, site marked, surgical consent, pre-op evaluation, timeout performed, IV checked, risks and benefits discussed and monitors and equipment checked Spinal Block Patient position: sitting Prep: Betadine Patient monitoring: heart rate, continuous pulse ox and blood pressure Approach: right paramedian Location: L3-4 Injection technique: single-shot Needle Needle type: Spinocan  Needle gauge: 22 G Needle length: 9 cm Assessment Sensory level: T6 Additional Notes Expiration date of kit checked and confirmed. Patient tolerated procedure well, without complications.

## 2012-02-05 NOTE — Transfer of Care (Signed)
Immediate Anesthesia Transfer of Care Note  Patient: Lindsey Frost  Procedure(s) Performed: Procedure(s) (LRB): TOTAL KNEE ARTHROPLASTY (Left)  Patient Location: PACU  Anesthesia Type: General and Regional  Level of Consciousness: awake, alert , oriented and patient cooperative  Airway & Oxygen Therapy: Patient Spontanous Breathing and Patient connected to face mask oxygen  Post-op Assessment: Report given to PACU RN and Post -op Vital signs reviewed and stable  Post vital signs: Reviewed and stable  Complications: No apparent anesthesia complications

## 2012-02-05 NOTE — Anesthesia Preprocedure Evaluation (Signed)
Anesthesia Evaluation  Patient identified by MRN, date of birth, ID band Patient awake    Reviewed: Allergy & Precautions, H&P , NPO status , Patient's Chart, lab work & pertinent test results  Airway Mallampati: II TM Distance: <3 FB Neck ROM: Full    Dental No notable dental hx.    Pulmonary sleep apnea ,  breath sounds clear to auscultation  Pulmonary exam normal       Cardiovascular hypertension, Pt. on medications + CAD, + Past MI and + Cardiac Stents Rhythm:Regular Rate:Normal     Neuro/Psych negative neurological ROS  negative psych ROS   GI/Hepatic negative GI ROS, Neg liver ROS,   Endo/Other  Diabetes mellitus-, Oral Hypoglycemic Agents  Renal/GU negative Renal ROS  negative genitourinary   Musculoskeletal negative musculoskeletal ROS (+)   Abdominal   Peds negative pediatric ROS (+)  Hematology negative hematology ROS (+)   Anesthesia Other Findings   Reproductive/Obstetrics negative OB ROS                           Anesthesia Physical Anesthesia Plan  ASA: III  Anesthesia Plan: Spinal   Post-op Pain Management:    Induction:   Airway Management Planned: Natural Airway  Additional Equipment:   Intra-op Plan:   Post-operative Plan:   Informed Consent: I have reviewed the patients History and Physical, chart, labs and discussed the procedure including the risks, benefits and alternatives for the proposed anesthesia with the patient or authorized representative who has indicated his/her understanding and acceptance.   Dental advisory given  Plan Discussed with: CRNA  Anesthesia Plan Comments:         Anesthesia Quick Evaluation

## 2012-02-05 NOTE — H&P (View-Only) (Signed)
NAME:  Lindsey Frost, Lindsey Frost                ACCOUNT NO.:  [id number]  MEDICAL RECORD NO.:  [id number]  LOCATION:  [facility]                         FACILITY:  [facility]  PHYSICIAN:  Matthew D. Olin, M.D.  DATE OF BIRTH:  04/29/1941  DATE OF ADMISSION:  01/30/2012 DATE OF DISCHARGE:  01/30/2012                             HISTORY & PHYSICAL  Pt Has Sleep Apnea but does not use her CPAP. Pt needs continuous pulse oximetry post op. DATE OF SURGERY:  February 05, 2012.  ADMITTING DIAGNOSIS:  End-stage osteoarthritis of the left knee.  HISTORY OF PRESENT ILLNESS:  This is a 71-year-old lady with a history of end-stage osteoarthritis of the left knee that has failed conservative treatment.  After discussion of treatment, benefits, risks, and options, the patient is now scheduled for total knee arthroplasty of the left knee.  Note that the patient is not a candidate for tranexamic acid or dexamethasone and will not receive either at surgery.  Her medical doctor is Amy Moon, nurse practitioner, and her cardiologist is Dr. Brian Munley.  She is given medications of aspirin, iron, Robaxin, MiraLax, and Colace for postoperative medications and she plans on going home after surgery.  PAST MEDICAL HISTORY:  Drug allergies:  None.  CURRENT MEDICATIONS: 1. Isosorbide 60 mg 1 daily. 2. Aspirin 81 mg 2 daily. 3. Metoprolol tartrate 50 mg b.i.d. 4. Losartan/hydrochlorothiazide 100/25 mg 1 daily. 5. Amlodipine 10 mg 1 daily. 6. Metformin 1000 mg b.i.d. 7. Glimepiride 4 mg b.i.d. 8. Pantoprazole 40 mg 1 daily. 9. Pravastatin 40 mg 1 at bedtime.  SERIOUS MEDICAL ILLNESSES:  Include hypertension, hyperlipidemia, coronary artery disease, diabetes, reflux, and sleep apnea.  She also has history of cardiac stent with MI.  Also medical illnesses include history of uterine cancer in 2000.  SURGERIES:  Include complete hysterectomy, cardiac stent, and knee arthroscopy with debridement.  FAMILY HISTORY:  Positive for  atrial fibrillation and hypertension.  SOCIAL HISTORY:  The patient is married.  She is retired.  She does not smoke and does not drink and again plans on going home after surgery.  REVIEW OF SYSTEMS:  CENTRAL NERVOUS SYSTEM:  Negative for headache, blurred vision, or dizziness.  PULMONARY:  Positive for sleep apnea. She is supposed to use a CPAP but has not from years.  Also history of pneumonia.  Positive for exertional shortness of breath.  Negative for PND and orthopnea.  CARDIOVASCULAR:  Negative for chest pain or palpitations.  Positive for history of MI with stent placement.  GI: Positive for reflux.  GU:  Negative for urinary tract difficulty. MUSCULOSKELETAL:  Positive as in HPI.  PHYSICAL EXAMINATION:  VITAL SIGNS:  BP 131/61, pulse 64 and regular, respirations 14. HEENT:  Head normocephalic.  Nose patent.  Ears patent.  Pupils equal, round, and reactive to light.  Throat without injection. NECK:  Supple without adenopathy.  Carotids 2+ without bruit. CHEST:  Clear to auscultation.  No rales or rhonchi.  Respirations 14. HEART:  Regular rate and rhythm at 64 beats per minute without murmur. ABDOMEN:  Soft with active bowel sounds.  No masses or organomegaly. NEUROLOGIC:  The patient is alert and oriented to time, place, and   person.  Cranial nerves II-XII grossly intact. EXTREMITIES:  Shows left knee with full extension, further flexion to 120 degrees.  Neurovascular status intact.  ASSESSMENT:  End-stage osteoarthritis, left knee.  PLAN:  Total knee arthroplasty, left knee.     Deziya Amero J. Erman Thum, P.A.   ______________________________ Matthew D. Olin, M.D.    SJC/MEDQ  D:  01/31/2012  T:  01/31/2012  Job:  [id number] 

## 2012-02-05 NOTE — Anesthesia Postprocedure Evaluation (Signed)
  Anesthesia Post-op Note  Patient: Lindsey Frost  Procedure(s) Performed: Procedure(s) (LRB): TOTAL KNEE ARTHROPLASTY (Left)  Patient Location: PACU  Anesthesia Type: Spinal  Level of Consciousness: awake and alert   Airway and Oxygen Therapy: Patient Spontanous Breathing  Post-op Pain: mild  Post-op Assessment: Post-op Vital signs reviewed, Patient's Cardiovascular Status Stable, Respiratory Function Stable, Patent Airway and No signs of Nausea or vomiting  Post-op Vital Signs: stable  Complications: No apparent anesthesia complications

## 2012-02-05 NOTE — Op Note (Signed)
NAME:  Lindsey Frost                      MEDICAL RECORD NO.:  [id number]                             FACILITY:  [facility]      PHYSICIAN:  Madlyn Frankel. Charlann Boxer, M.D.  DATE OF BIRTH:  September 27, 1940      DATE OF PROCEDURE:  02/05/2012                                     OPERATIVE REPORT         PREOPERATIVE DIAGNOSIS:  Left knee osteoarthritis.      POSTOPERATIVE DIAGNOSIS:  Left knee osteoarthritis.      FINDINGS:  The patient was noted to have complete loss of cartilage and   bone-on-bone arthritis with associated osteophytes in the medial and patellofemoral compartments of   the knee with a significant synovitis and associated effusion.      PROCEDURE:  Left total knee replacement.      COMPONENTS USED:  DePuy rotating platform posterior stabilized knee   system, a size 2.5 femur, 2.5 tibia, 10 mm insert, and 35 patellar   button.      SURGEON:  Madlyn Frankel. Charlann Boxer, M.D.      ASSISTANT:  Lanney Gins, PA-C.      ANESTHESIA:  Spinal.      SPECIMENS:  None.      COMPLICATION:  None.      DRAINS:  One Hemovac.  EBL: 100cc      TOURNIQUET TIME:   Total Tourniquet Time Documented: Thigh (Left) - 38 minutes .      The patient was stable to the recovery room.      INDICATION FOR PROCEDURE:  Lindsey Kristeen Lantz is a 71 y.o. female patient of   mine.  The patient had been seen, evaluated, and treated conservatively in the   office with medication, activity modification, and injections.  The patient had   radiographic changes of bone-on-bone arthritis with endplate sclerosis and osteophytes noted.      The patient failed conservative measures including medication, injections, and activity modification, and at this point was ready for more definitive measures.   Based on the radiographic changes and failed conservative measures, the patient   decided to proceed with total knee replacement.  Risks of infection,   DVT, component failure, need for revision surgery, postop course, and    expectations were all   discussed and reviewed.  Consent was obtained for benefit of pain   relief.      PROCEDURE IN DETAIL:  The patient was brought to the operative theater.   Once adequate anesthesia, preoperative antibiotics, 2 gm of Ancef administered, the patient was positioned supine with the left thigh tourniquet placed.  The  left lower extremity was prepped and draped in sterile fashion.  A time-   out was performed identifying the patient, planned procedure, and   extremity.      The left lower extremity was placed in the [facility] leg holder.  The leg was   exsanguinated, tourniquet elevated to 250 mmHg.  A midline incision was   made followed by median parapatellar arthrotomy.  Following initial   exposure, attention was first directed to the patella.  Precut  measurement was noted to be 21 mm.  I resected down to 14 mm and used a   35 patellar button to restore patellar height as well as cover the cut   surface.      The lug holes were drilled and a metal shim was placed to protect the   patella from retractors and saw blades.      At this point, attention was now directed to the femur.  The femoral   canal was opened with a drill, irrigated to try to prevent fat emboli.  An   intramedullary rod was passed at 3 degrees valgus, 10 mm of bone was   resected off the distal femur.  Following this resection, the tibia was   subluxated anteriorly.  Using the extramedullary guide, 10 mm of bone was resected off   the proximal lateral tibia.  We confirmed the gap would be   stable medially and laterally with a 10 mm insert as well as confirmed   the cut was perpendicular in the coronal plane, checking with an alignment rod.      Once this was done, I sized the femur to be a size 2.5 in the anterior-   posterior dimension, chose a standard component based on medial and   lateral dimension.  The size 2.5 rotation block was then pinned in   position anterior referenced using the  C-clamp to set rotation.  The   anterior, posterior, and  chamfer cuts were made without difficulty nor   notching making certain that I was along the anterior cortex to help   with flexion gap stability.      The final box cut was made off the lateral aspect of distal femur.      At this point, the tibia was sized to be a size 2.5, the size 2.5 tray was   then pinned in position through the medial third of the tubercle,   drilled, and keel punched.  Trial reduction was now carried with a 2.5 femur,  2.5 tibia, a 10 mm insert, and the 35 patella botton.  The knee was brought to   extension, full extension with good flexion stability with the patella   tracking through the trochlea without application of pressure.  Given   all these findings, the trial components removed.  Final components were   opened and cement was mixed.  The knee was irrigated with normal saline   solution and pulse lavage.  The synovial lining was   then injected with 0.25% Marcaine with epinephrine and 1 cc of Toradol,   total of 61 cc.      The knee was irrigated.  Final implants were then cemented onto clean and   dried cut surfaces of bone with the knee brought to extension with a 10   mm trial insert.      Once the cement had fully cured, the excess cement was removed   throughout the knee.  I confirmed I was satisfied with the range of   motion and stability, and the final 10 mm insert was chosen.  It was   placed into the knee.      The tourniquet had been let down at 38 minutes.  No significant   hemostasis required.  The medium Hemovac drain was placed deep.  The   extensor mechanism was then reapproximated using #1 Vicryl with the knee   in flexion.  The   remaining wound was closed with 2-0 Vicryl  and running 4-0 Monocryl.   The knee was cleaned, dried, dressed sterilely using Dermabond and   Aquacel dressing.  Drain site dressed separately.  The patient was then   brought to recovery room in stable  condition, tolerating the procedure   well.   Please note that Physician Assistant, Danae Orleans, was present for the entirety of the case, and was utilized for pre-operative positioning, peri-operative retractor management, general facilitation of the procedure.  He was also utilized for primary wound closure at the end of the case.              Pietro Cassis Alvan Dame, M.D.

## 2012-02-06 DIAGNOSIS — Z96659 Presence of unspecified artificial knee joint: Secondary | ICD-10-CM

## 2012-02-06 HISTORY — DX: Presence of unspecified artificial knee joint: Z96.659

## 2012-02-06 LAB — BASIC METABOLIC PANEL
BUN: 12 mg/dL (ref 6–23)
Chloride: 99 mEq/L (ref 96–112)
Creatinine, Ser: 0.87 mg/dL (ref 0.50–1.10)
GFR calc Af Amer: 75 mL/min — ABNORMAL LOW (ref >90)
GFR calc non Af Amer: 65 mL/min — ABNORMAL LOW (ref >90)

## 2012-02-06 LAB — CBC
HCT: 32.3 % — ABNORMAL LOW (ref 36.0–46.0)
MCHC: 31.3 g/dL (ref 30.0–36.0)
MCV: 79.8 fL (ref 78.0–100.0)
Platelets: 307 10*3/uL (ref 150–400)
RDW: 15.5 % (ref 11.5–15.5)
WBC: 13.4 10*3/uL — ABNORMAL HIGH (ref 4.0–10.5)

## 2012-02-06 LAB — GLUCOSE, CAPILLARY
Glucose-Capillary: 158 mg/dL — ABNORMAL HIGH (ref 70–99)
Glucose-Capillary: 150 mg/dL — ABNORMAL HIGH (ref 70–99)
Glucose-Capillary: 179 mg/dL — ABNORMAL HIGH (ref 70–99)

## 2012-02-06 NOTE — Progress Notes (Signed)
02/06/12 1500  PT Visit Information  Last PT Received On 02/06/12  Assistance Needed +1  PT Time Calculation  PT Start Time 1448  PT Stop Time 1511  PT Time Calculation (min) 23 min  Subjective Data  Subjective i need some sleep  Precautions  Precautions Knee  Precaution Comments independent SLR  Restrictions  LLE Weight Bearing WBAT  Cognition  Overall Cognitive Status Appears within functional limits for tasks assessed/performed  Arousal/Alertness Awake/alert  Orientation Level Appears intact for tasks assessed  Behavior During Session [facility] for tasks performed  Bed Mobility  Bed Mobility Sit to Supine  Sit to Supine 4: Min assist  Details for Bed Mobility Assistance cues for technique  Transfers  Transfers Sit to Stand;Stand to Sit  Sit to Stand 4: Min guard  Stand to Sit 4: Min guard  Details for Transfer Assistance cues for hand placement  Ambulation/Gait  Ambulation/Gait Assistance 4: Min guard  Ambulation Distance (Feet) 40 Feet  Assistive device Rolling walker  Ambulation/Gait Assistance Details cues for sequence  and posture  Gait Pattern Step-to pattern  Exercises  Exercises Total Joint  Total Joint Exercises  Ankle Circles/Pumps AROM;Both;10 reps  [employer] AROM;5 reps;Both  Heel Slides AAROM;10 reps;Left  Straight Leg Raises AROM;10 reps;Left  PT - End of Session  Activity Tolerance Patient tolerated treatment well  Patient left in bed;with call bell/phone within reach;with family/visitor present  PT - Assessment/Plan  Comments on Treatment Session pain controlled; ice to knee after PT; progressing well  PT Plan Discharge plan remains appropriate;Frequency remains appropriate  PT Frequency 7X/week  Follow Up Recommendations Home health PT  Equipment Recommended None recommended by PT  Acute Rehab PT Goals  Time For Goal Achievement 02/13/12  Potential to Achieve Goals Good  Pt will go Supine/Side to Sit with supervision  PT Goal: Supine/Side to Sit -  Progress Progressing toward goal  Pt will go Sit to Stand with supervision  PT Goal: Sit to Stand - Progress Progressing toward goal  Pt will Ambulate 51 - 150 feet;with supervision;with rolling walker  PT Goal: Ambulate - Progress Progressing toward goal  PT General Charges  $$ ACUTE PT VISIT 1 Procedure  PT Treatments  $Gait Training 8-22 mins  $Therapeutic Exercise 8-22 mins

## 2012-02-06 NOTE — Progress Notes (Deleted)
02/06/12 1055  PT General Charges  $$ ACUTE PT VISIT 1 Procedure  PT Evaluation  $Initial PT Evaluation Tier II 1 Procedure  PT Treatments  $Gait Training 8-22 mins  $Therapeutic Exercise 8-22 mins

## 2012-02-06 NOTE — Progress Notes (Signed)
Utilization review completed.  

## 2012-02-06 NOTE — Progress Notes (Signed)
  Subjective: 1 Day Post-Op Procedure(s) (LRB): TOTAL KNEE ARTHROPLASTY (Left)   Patient reports pain as mild, pain well controlled. No events throughout the night.   Objective:   VITALS:   Filed Vitals:   02/06/12 1108  BP: 124/72  Pulse: 68  Temp: 97.8 F (36.6 C)   Resp: 16    Neurovascular intact Dorsiflexion/Plantar flexion intact Incision: dressing C/D/I No cellulitis present Compartment soft  LABS  Basename 02/06/12 0343  HGB 10.1*  HCT 32.3*  WBC 13.4*  PLT 307     Basename 02/06/12 0343  NA 135  K 4.4  BUN 12  CREATININE 0.87  GLUCOSE 197*     Assessment/Plan: 1 Day Post-Op Procedure(s) (LRB): TOTAL KNEE ARTHROPLASTY (Left)   HV drain d/c'ed Foley cath d/c'ed Advance diet Up with therapy D/C IV fluids Discharge home with home health tomorrow if continues to do well.   Anastasio Auerbach [place]   PAC  02/06/2012, 12:52 PM

## 2012-02-06 NOTE — Progress Notes (Signed)
Inpatient Diabetes Program Recommendations  AACE/ADA: New Consensus Statement on Inpatient Glycemic Control (2009)  Target Ranges:  Prepandial:   less than 140 mg/dL      Peak postprandial:   less than 180 mg/dL (1-2 hours)      Critically ill patients:  140 - 180 mg/dL   Reason for Visit: Hyperglycemia  Results for [name], [name] (MRN [id number]) as of 02/06/2012 09:51  Ref. Range 02/05/2012 11:38 02/05/2012 17:54 02/06/2012 07:38  Glucose-Capillary Latest Range: 70-99 mg/dL 409 (H) 811 (H) 914 (H)    Inpatient Diabetes Program Recommendations Correction (SSI): Add Novolog sensitive tidwc  Note: On metformin 500 bid and Amaryl 4 mg bid.  Will follow.

## 2012-02-07 LAB — CBC
MCH: 24.9 pg — ABNORMAL LOW (ref 26.0–34.0)
MCHC: 30.9 g/dL (ref 30.0–36.0)
MCV: 80.4 fL (ref 78.0–100.0)
Platelets: 289 10*3/uL (ref 150–400)
RDW: 15.8 % — ABNORMAL HIGH (ref 11.5–15.5)

## 2012-02-07 LAB — BASIC METABOLIC PANEL
Calcium: 9 mg/dL (ref 8.4–10.5)
Creatinine, Ser: 0.92 mg/dL (ref 0.50–1.10)
GFR calc Af Amer: 70 mL/min — ABNORMAL LOW (ref >90)
GFR calc non Af Amer: 61 mL/min — ABNORMAL LOW (ref >90)
Sodium: 132 mEq/L — ABNORMAL LOW (ref 135–145)

## 2012-02-07 MED ORDER — DSS 100 MG PO CAPS: 100.0000 mg | ORAL_CAPSULE | Freq: Two times a day (BID) | ORAL | Status: DC

## 2012-02-07 MED ORDER — METHOCARBAMOL 500 MG PO TABS: 500.0000 mg | ORAL_TABLET | Freq: Four times a day (QID) | ORAL | Status: DC | PRN

## 2012-02-07 MED ORDER — POLYETHYLENE GLYCOL 3350 17 G PO PACK: 17.0000 g | PACK | Freq: Two times a day (BID) | ORAL | Status: DC

## 2012-02-07 MED ORDER — FERROUS SULFATE 325 (65 FE) MG PO TABS: 325.0000 mg | ORAL_TABLET | Freq: Three times a day (TID) | ORAL | Status: DC

## 2012-02-07 MED ORDER — ASPIRIN EC 325 MG PO TBEC: 325.0000 mg | DELAYED_RELEASE_TABLET | Freq: Two times a day (BID) | ORAL | Status: DC

## 2012-02-07 MED ORDER — HYDROCODONE-ACETAMINOPHEN 7.5-325 MG PO TABS: 1.0000 | ORAL_TABLET | ORAL | Status: DC | PRN

## 2012-02-07 NOTE — Progress Notes (Signed)
OT Note:  Pt screened for OT.  She has been walking to the bathroom with nursing, has tub bench from daughter and 3:1 at home.  Husband will assist with ADLs.  Kimberly,  [phone] 02/07/2012

## 2012-02-07 NOTE — Discharge Summary (Signed)
Physician Discharge Summary  Patient ID: Lindsey Frost MRN: [id number] DOB/AGE: 01/03/1941 71 y.o.  Admit date: 02/05/2012 Discharge date: 02/07/2012  Procedures:  Procedure(s) (LRB): TOTAL KNEE ARTHROPLASTY (Left)  Attending Physician:  Dr. Durene Romans   Admission Diagnoses:  End-stage osteoarthritis of the left knee   Discharge Diagnoses:  Principal Problem:  *S/P left TKA HTN Hyperlipidemia CAD DM Reflux Sleep apnea.  History of cardiac stent with MI History of uterine cancer in 2000   HPI:  This is a 71 year old lady with a history of end-stage osteoarthritis of the left knee that has failed conservative treatment. After discussion of treatment, benefits, risks, and options, the patient is now scheduled for total knee arthroplasty of the left knee. Note that the patient is not a candidate for tranexamic acid or dexamethasone and will not receive either at surgery. Her medical doctor is Gaye Alken, nurse practitioner, and her cardiologist is Dr. Norman Herrlich. She is given medications of aspirin, iron, Robaxin, MiraLax, and Colace for postoperative medications and she plans on going home after surgery.  PCP: No primary provider on file.   Discharged Condition: good  Hospital Course:  Patient underwent the above stated procedure on 02/05/2012. Patient tolerated the procedure well and brought to the recovery room in good condition and subsequently to the floor.  POD #1 BP: 124/72 ; Pulse: 68 ; Temp: 97.8 F (36.6 C) ; Resp: 16  Pt's foley was removed, as well as the hemovac drain removed. IV was changed to a saline lock. Patient reports pain as mild, pain well controlled. No events throughout the night. Neurovascular intact, dorsiflexion/plantar flexion intact, incision: dressing C/D/I, no cellulitis present and compartment soft.   LABS  Basename  02/06/12 0343   HGB  10.1  HCT  32.3   POD #2  BP: 166/91 ; Pulse: 83 ; Temp: 98.1 F (36.7 C) ; Resp: 18 Patient  reports pain as mild, pain well controlled. No events throughout the night. She states that she didn't get a lot of sleep because she kept getting woke up. She states that she is ready to be discharged home. Neurovascular intact, dorsiflexion/plantar flexion intact, incision: dressing C/D/I, no cellulitis present and compartment soft.   LABS  Basename  02/07/12 0341  HGB  9.5  HCT  30.7    Discharge Exam: General appearance: alert, cooperative and no distress Extremities: Homans sign is negative, no sign of DVT, no edema, redness or tenderness in the calves or thighs and no ulcers, gangrene or trophic changes  Disposition:  Home with follow up in 2 weeks   Follow-up Information    Follow up with [name], MD. Schedule an appointment as soon as possible for a visit in 2 weeks.   Contact information:   [facility] [address] [place] [address] [phone]          Discharge Orders    Future Orders Please Complete By Expires   Diet - low sodium heart healthy      Call MD / Call 911      Comments:   If you experience chest pain or shortness of breath, CALL 911 and be transported to the hospital emergency room.  If you develope a fever above 101 F, pus (white drainage) or increased drainage or redness at the wound, or calf pain, call your surgeon's office.   Discharge instructions      Comments:   Maintain surgical dressing for 8 days, then replace with  gauze and tape. Keep the area dry and clean until follow up. Follow up in 2 weeks at [facility]. Call with any questions or concerns.   Constipation Prevention      Comments:   Drink plenty of fluids.  Prune juice may be helpful.  You may use a stool softener, such as Colace (over the counter) 100 mg twice a day.  Use MiraLax (over the counter) for constipation as needed.   Increase activity slowly as tolerated      Driving restrictions      Comments:   No  driving for 4 weeks   TED hose      Comments:   Use stockings (TED hose) for 2 weeks on both leg(s).  You may remove them at night for sleeping.   Change dressing      Comments:   Maintain surgical dressing for 8 days, then change the dressing daily with sterile 4 x 4 inch gauze dressing and tape. Keep the area dry and clean.      Current Discharge Medication List    START taking these medications   Details  docusate sodium 100 MG CAPS Take 100 mg by mouth 2 (two) times daily. Qty: 10 capsule    ferrous sulfate 325 (65 FE) MG tablet Take 1 tablet (325 mg total) by mouth 3 (three) times daily after meals.    HYDROcodone-acetaminophen (NORCO) 7.5-325 MG per tablet Take 1-2 tablets by mouth every 4 (four) hours as needed for pain. Qty: 120 tablet, Refills: 0    methocarbamol (ROBAXIN) 500 MG tablet Take 1 tablet (500 mg total) by mouth every 6 (six) hours as needed (muscle spasms).    polyethylene glycol (MIRALAX / GLYCOLAX) packet Take 17 g by mouth 2 (two) times daily. Qty: 14 each      CONTINUE these medications which have CHANGED   Details  aspirin EC 325 MG tablet Take 1 tablet (325 mg total) by mouth 2 (two) times daily. X 4 weeks Qty: 60 tablet, Refills: 0      CONTINUE these medications which have NOT CHANGED   Details  amLODipine (NORVASC) 10 MG tablet Take 10 mg by mouth daily.    Calcium-Vitamin D (CALTRATE 600 PLUS-VIT D PO) Take 1 tablet by mouth 2 (two) times daily.    glimepiride (AMARYL) 4 MG tablet Take 4 mg by mouth 2 (two) times daily.    Glucosamine-Chondroitin (GLUCOSAMINE CHONDR COMPLEX PO) Take 1 tablet by mouth 2 (two) times daily. 1500 GLUCOSAMINE AND 1200 CHONDROITIN    isosorbide mononitrate (IMDUR) 60 MG 24 hr tablet Take 60 mg by mouth daily.    losartan-hydrochlorothiazide (HYZAAR) 100-25 MG per tablet Take 1 tablet by mouth daily.    metFORMIN (GLUCOPHAGE) 1000 MG tablet Take 1,000 mg by mouth 2 (two) times daily with a meal.    metoprolol  (LOPRESSOR) 50 MG tablet Take 50 mg by mouth 2 (two) times daily.    pantoprazole (PROTONIX) 40 MG tablet Take 40 mg by mouth daily.    pravastatin (PRAVACHOL) 40 MG tablet Take 40 mg by mouth at bedtime.         Signed: Anastasio Auerbach. Jden Want   PAC  02/07/2012, 9:05 AM

## 2012-02-07 NOTE — Progress Notes (Signed)
Pt discharged home via family; Pt and family given and explained all discharge instructions, carenotes, and prescriptions; pt and family stated understanding and denied questions/concerns; all f/u appointments in place; IV removed without complicaitons; pt stable at time of discharge; all home health set up prior to discharge; patient and family stated understanding with incision instructions;

## 2012-02-07 NOTE — Progress Notes (Signed)
Physical Therapy Treatment Patient Details Name: Lindsey Frost MRN: [id number] DOB: 18-Mar-1941 Today's Date: 02/07/2012 Time: 4098-1191 PT Time Calculation (min): 29 min  PT Assessment / Plan / Recommendation Comments on Treatment Session  pain controlled, ice to knee    Follow Up Recommendations  Home health PT    Barriers to Discharge        Equipment Recommendations  None recommended by PT    Recommendations for Other Services    Frequency 7X/week   Plan Discharge plan remains appropriate;Frequency remains appropriate    Precautions / Restrictions Precautions Precautions: Knee Precaution Comments: independent SLR Restrictions Weight Bearing Restrictions: No LLE Weight Bearing: Weight bearing as tolerated   Pertinent Vitals/Pain     Mobility  Bed Mobility Bed Mobility: Sit to Supine Supine to Sit: 4: Min guard Details for Bed Mobility Assistance: min with LLE, cues for task completion, increased tiem today Transfers Transfers: Sit to Stand;Stand to Sit Sit to Stand: 4: Min guard Stand to Sit: 4: Min guard Details for Transfer Assistance: cues for hand placement Ambulation/Gait Ambulation/Gait Assistance: 4: Min guard Ambulation Distance (Feet): 15 Feet Assistive device: Rolling walker Ambulation/Gait Assistance Details: cues for sequence, chair to pt due to fatigue and pt c/o of head feeling"crazy" Gait Pattern: Step-to pattern Gait velocity: slow    Exercises Total Joint Exercises Ankle Circles/Pumps: AROM;Both;10 reps Quad Sets: AROM;10 reps;Left Short Arc Quad: AROM;AAROM;10 reps;Left Heel Slides: AAROM;10 reps;Left Straight Leg Raises: AROM;10 reps;Left   PT Diagnosis:    PT Problem List:   PT Treatment Interventions:     PT Goals Acute Rehab PT Goals Time For Goal Achievement: 02/13/12 Potential to Achieve Goals: Good Pt will go Supine/Side to Sit: with supervision PT Goal: Supine/Side to Sit - Progress: Progressing toward goal Pt will go  Sit to Stand: with supervision PT Goal: Sit to Stand - Progress: Progressing toward goal Pt will Ambulate: 51 - 150 feet;with supervision;with rolling walker PT Goal: Ambulate - Progress: Progressing toward goal  Visit Information  Last PT Received On: 02/07/12 Assistance Needed: +1    Subjective Data  Subjective: i still need to sleep   Cognition  Overall Cognitive Status: Appears within functional limits for tasks assessed/performed Arousal/Alertness: Awake/alert Orientation Level: Appears intact for tasks assessed Behavior During Session: [facility] for tasks performed    Balance     End of Session PT - End of Session Activity Tolerance: Patient tolerated treatment well Patient left: in chair;with call bell/phone within reach;with family/visitor present Nurse Communication: Other (comment) (O2 sats 86-87% on RA)   GP     [facility] 02/07/2012, 10:34 AM

## 2012-02-07 NOTE — Care Management Note (Signed)
    Page 1 of 2   02/07/2012     1:02:59 PM   CARE MANAGEMENT NOTE 02/07/2012  Patient:  CONSUELLO, LASSALLE   Account Number:  [id number]  Date Initiated:  02/07/2012  Documentation initiated by:  Colleen Can  Subjective/Objective Assessment:   dx osteoarthritis left knee; total knee replacemnt on day of inpatient admisssion     Action/Plan:   CM spoke with patient. Plans are for patient to return to her home in [facility] where spouse will be caregiver. Alrady has dme. Wants [facility] for [facility] services   Anticipated DC Date:  02/07/2012   Anticipated DC Plan:  HOME W HOME HEALTH SERVICES  In-house referral  NA      DC Planning Services  CM consult      [facility] Choice  HOME HEALTH   Choice offered to / List presented to:  C-1 Patient   DME arranged  NA      DME agency  NA     HH arranged  HH-2 PT      [facility] agency  [facility]   Status of service:  Completed, signed off Medicare Important Message given?  NA - LOS <3 / Initial given by admissions (If response is "NO", the following Medicare IM given date fields will be blank) Date Medicare IM given:   Date Additional Medicare IM given:    Discharge Disposition:  HOME W HOME HEALTH SERVICES  Per UR Regulation:    If discussed at Long Length of Stay Meetings, dates discussed:    Comments:  02/07/2012 Raynelle Bring BSN CCM [phone] [facility] services will start tomorrow 02/07/2012

## 2012-02-07 NOTE — Progress Notes (Signed)
02/06/12 1051  PT Visit Information  PT EVALUATION  Last PT Received On 02/06/12  Assistance Needed +1  PT Time Calculation  PT Start Time 0950  PT Stop Time 1022  PT Time Calculation (min) 32 min  Subjective Data  Subjective we didn't get too much sleep  Patient Stated Goal home  Precautions  Precautions Knee  Precaution Comments independent SLR  Restrictions  LLE Weight Bearing WBAT  Home Living  Lives With Spouse  Available Help at Discharge Family  Type of Home House  Home Access Stairs to enter  Entrance Stairs-Number of Steps 1x2  Entrance Stairs-Rails None  Home Layout One level  Home Adaptive Equipment Walker - rolling;Shower chair without back;Straight cane  Prior Function  Level of Independence Independent  Able to Take Stairs? Yes  Cognition  Overall Cognitive Status Appears within functional limits for tasks assessed/performed  Arousal/Alertness Awake/alert  Orientation Level Appears intact for tasks assessed  Behavior During Session [facility] for tasks performed  Right Upper Extremity Assessment  RUE ROM/Strength/Tone [facility] for tasks assessed  Left Upper Extremity Assessment  LUE ROM/Strength/Tone WFL for tasks assessed  Right Lower Extremity Assessment  RLE ROM/Strength/Tone [facility] for tasks assessed  Left Lower Extremity Assessment  LLE ROM/Strength/Tone Deficits  LLE ROM/Strength/Tone Deficits  able to do I SLR; ankle WFL; knee flexion limited by pain to ~40 degrees   Bed Mobility  Bed Mobility Supine to Sit  Supine to Sit 4: Min assist  Transfers  Transfers Sit to Stand;Stand to Sit  Sit to Stand 4: Min assist;From bed  Stand to Sit 4: Min assist;To chair/3-in-1  Ambulation/Gait  Ambulation/Gait Assistance 4: Min assist  Ambulation Distance (Feet) 65 Feet  Assistive device Rolling walker  Ambulation/Gait Assistance Details cues for sequence  Gait Pattern Step-to pattern  Total Joint Exercises  Ankle Circles/Pumps AROM;Both;10 reps  [employer] AROM;5  reps;Both  PT - End of Session  Equipment Utilized During Treatment Gait belt  Activity Tolerance Patient tolerated treatment well  Patient left in chair;with call bell/phone within reach;with family/visitor present  PT Assessment  PT Recommendation/Assessment Patient needs continued PT services  PT Problem List Decreased strength;Decreased range of motion;Decreased activity tolerance;Decreased mobility;Decreased knowledge of use of DME  Barriers to Discharge None  PT Therapy Diagnosis  Difficulty walking  PT Plan  PT Frequency 7X/week  PT Treatment/Interventions DME instruction;Gait training;Stair training;Functional mobility training;Therapeutic activities;Therapeutic exercise;Balance training;Patient/family education  PT Recommendation  Follow Up Recommendations Home health PT  Equipment Recommended None recommended by PT  Individuals Consulted  Consulted and Agree with Results and Recommendations Patient  Acute Rehab PT Goals  PT Goal Formulation With patient  Time For Goal Achievement 02/13/12  Potential to Achieve Goals Good  Pt will go Supine/Side to Sit with supervision  PT Goal: Supine/Side to Sit - Progress Goal set today  Pt will go Sit to Stand with supervision  PT Goal: Sit to Stand - Progress Goal set today  Pt will Ambulate 51 - 150 feet;with supervision;with rolling walker  PT Goal: Ambulate - Progress Goal set today  Pt will Go Up / Down Stairs 1-2 stairs;with min assist;with least restrictive assistive device

## 2012-02-07 NOTE — Progress Notes (Signed)
  Subjective: 2 Days Post-Op Procedure(s) (LRB): TOTAL KNEE ARTHROPLASTY (Left)   Patient reports pain as mild, pain well controlled. No events throughout the night. She states that she didn't get a lot of sleep because she kept getting woke up. She states that she is ready to be discharged home.  Objective:   VITALS:   Filed Vitals:   02/07/12 0733  BP: 166/91  Pulse: 83  Temp: 98.1 F (36.7 C)   Resp: 18    Neurovascular intact Dorsiflexion/Plantar flexion intact Incision: dressing C/D/I No cellulitis present Compartment soft  LABS  Basename 02/07/12 0341 02/06/12 0343  HGB 9.5* 10.1*  HCT 30.7* 32.3*  WBC 10.8* 13.4*  PLT 289 307     Basename 02/07/12 0341 02/06/12 0343  NA 132* 135  K 3.7 4.4  BUN 15 12  CREATININE 0.92 0.87  GLUCOSE 167* 197*     Assessment/Plan: 2 Days Post-Op Procedure(s) (LRB): TOTAL KNEE ARTHROPLASTY (Left)   Up with therapy Discharge home with home health Follow up in 2 weeks at [facility].  Follow-up Information    Follow up with OLIN,Corderius Saraceni D in 2 weeks.   Contact information:   [facility] [address], [address] [place] [address] [phone]           Anastasio Auerbach. Almeter Westhoff   PAC  02/07/2012, 8:55 AM

## 2012-02-08 ENCOUNTER — Inpatient Hospital Stay (HOSPITAL_COMMUNITY): Payer: Medicare Other

## 2012-02-08 ENCOUNTER — Inpatient Hospital Stay (HOSPITAL_COMMUNITY)
Admission: EM | Admit: 2012-02-08 | Discharge: 2012-02-10 | DRG: 282 | Disposition: A | Discharge status: Home / Self Care | Payer: Medicare Other | Attending: Internal Medicine | Admitting: Internal Medicine

## 2012-02-08 ENCOUNTER — Emergency Department (HOSPITAL_COMMUNITY): Payer: Medicare Other

## 2012-02-08 ENCOUNTER — Encounter (HOSPITAL_COMMUNITY): Payer: Self-pay | Admitting: Emergency Medicine

## 2012-02-08 DIAGNOSIS — G4730 Sleep apnea, unspecified: Secondary | ICD-10-CM | POA: Diagnosis present

## 2012-02-08 DIAGNOSIS — E119 Type 2 diabetes mellitus without complications: Secondary | ICD-10-CM | POA: Diagnosis present

## 2012-02-08 DIAGNOSIS — R0902 Hypoxemia: Secondary | ICD-10-CM

## 2012-02-08 DIAGNOSIS — I509 Heart failure, unspecified: Secondary | ICD-10-CM | POA: Diagnosis present

## 2012-02-08 DIAGNOSIS — R799 Abnormal finding of blood chemistry, unspecified: Secondary | ICD-10-CM | POA: Diagnosis present

## 2012-02-08 DIAGNOSIS — R404 Transient alteration of awareness: Secondary | ICD-10-CM

## 2012-02-08 DIAGNOSIS — R4182 Altered mental status, unspecified: Secondary | ICD-10-CM | POA: Diagnosis present

## 2012-02-08 DIAGNOSIS — I5032 Chronic diastolic (congestive) heart failure: Secondary | ICD-10-CM

## 2012-02-08 DIAGNOSIS — E782 Mixed hyperlipidemia: Secondary | ICD-10-CM | POA: Diagnosis present

## 2012-02-08 DIAGNOSIS — K219 Gastro-esophageal reflux disease without esophagitis: Secondary | ICD-10-CM | POA: Diagnosis present

## 2012-02-08 DIAGNOSIS — M79609 Pain in unspecified limb: Secondary | ICD-10-CM

## 2012-02-08 DIAGNOSIS — M1710 Unilateral primary osteoarthritis, unspecified knee: Secondary | ICD-10-CM | POA: Diagnosis present

## 2012-02-08 DIAGNOSIS — M7989 Other specified soft tissue disorders: Secondary | ICD-10-CM

## 2012-02-08 DIAGNOSIS — Z79899 Other long term (current) drug therapy: Secondary | ICD-10-CM

## 2012-02-08 DIAGNOSIS — I10 Essential (primary) hypertension: Secondary | ICD-10-CM | POA: Diagnosis present

## 2012-02-08 DIAGNOSIS — I214 Non-ST elevation (NSTEMI) myocardial infarction: Principal | ICD-10-CM

## 2012-02-08 DIAGNOSIS — I252 Old myocardial infarction: Secondary | ICD-10-CM

## 2012-02-08 DIAGNOSIS — I25119 Atherosclerotic heart disease of native coronary artery with unspecified angina pectoris: Secondary | ICD-10-CM

## 2012-02-08 DIAGNOSIS — J811 Chronic pulmonary edema: Secondary | ICD-10-CM

## 2012-02-08 DIAGNOSIS — I2510 Atherosclerotic heart disease of native coronary artery without angina pectoris: Secondary | ICD-10-CM | POA: Diagnosis present

## 2012-02-08 DIAGNOSIS — Z96659 Presence of unspecified artificial knee joint: Secondary | ICD-10-CM

## 2012-02-08 HISTORY — DX: Mixed hyperlipidemia: E78.2

## 2012-02-08 HISTORY — DX: Chronic diastolic (congestive) heart failure: I50.32

## 2012-02-08 HISTORY — DX: Atherosclerotic heart disease of native coronary artery without angina pectoris: I25.10

## 2012-02-08 HISTORY — DX: Transient alteration of awareness: R40.4

## 2012-02-08 HISTORY — DX: Essential (primary) hypertension: I10

## 2012-02-08 HISTORY — DX: Heart failure, unspecified: I50.9

## 2012-02-08 LAB — APTT: aPTT: 41 seconds — ABNORMAL HIGH (ref 24–37)

## 2012-02-08 LAB — DIFFERENTIAL
Basophils Absolute: 0 10*3/uL (ref 0.0–0.1)
Eosinophils Relative: 0 % (ref 0–5)
Lymphocytes Relative: 5 % — ABNORMAL LOW (ref 12–46)
Lymphs Abs: 0.6 10*3/uL — ABNORMAL LOW (ref 0.7–4.0)
Monocytes Absolute: 1 10*3/uL (ref 0.1–1.0)
Monocytes Relative: 7 % (ref 3–12)

## 2012-02-08 LAB — BLOOD GAS, ARTERIAL
Bicarbonate: 24.3 mEq/L — ABNORMAL HIGH (ref 20.0–24.0)
O2 Saturation: 91.1 %
Patient temperature: 98.6
TCO2: 22.2 mmol/L (ref 0–100)
pO2, Arterial: 61 mmHg — ABNORMAL LOW (ref 80.0–100.0)

## 2012-02-08 LAB — URINE MICROSCOPIC-ADD ON

## 2012-02-08 LAB — CARDIAC PANEL(CRET KIN+CKTOT+MB+TROPI)
Relative Index: INVALID (ref 0.0–2.5)
Relative Index: 5.8 — ABNORMAL HIGH (ref 0.0–2.5)
Total CK: 106 U/L (ref 7–177)
Troponin I: 1.32 ng/mL (ref <0.30)

## 2012-02-08 LAB — COMPREHENSIVE METABOLIC PANEL
BUN: 18 mg/dL (ref 6–23)
CO2: 23 mEq/L (ref 19–32)
Calcium: 9.7 mg/dL (ref 8.4–10.5)
Creatinine, Ser: 0.81 mg/dL (ref 0.50–1.10)
GFR calc Af Amer: 82 mL/min — ABNORMAL LOW (ref >90)
GFR calc non Af Amer: 71 mL/min — ABNORMAL LOW (ref >90)
Glucose, Bld: 232 mg/dL — ABNORMAL HIGH (ref 70–99)

## 2012-02-08 LAB — PROCALCITONIN: Procalcitonin: 0.13 ng/mL

## 2012-02-08 LAB — GLUCOSE, CAPILLARY
Glucose-Capillary: 111 mg/dL — ABNORMAL HIGH (ref 70–99)
Glucose-Capillary: 200 mg/dL — ABNORMAL HIGH (ref 70–99)

## 2012-02-08 LAB — CBC
HCT: 32.1 % — ABNORMAL LOW (ref 36.0–46.0)
MCV: 81.1 fL (ref 78.0–100.0)
RDW: 15.6 % — ABNORMAL HIGH (ref 11.5–15.5)
WBC: 13.3 10*3/uL — ABNORMAL HIGH (ref 4.0–10.5)

## 2012-02-08 LAB — URINALYSIS, ROUTINE W REFLEX MICROSCOPIC
Bilirubin Urine: NEGATIVE
Ketones, ur: NEGATIVE mg/dL
Nitrite: NEGATIVE
Urobilinogen, UA: 0.2 mg/dL (ref 0.0–1.0)

## 2012-02-08 LAB — PROTIME-INR
INR: 1.57 — ABNORMAL HIGH (ref 0.00–1.49)
Prothrombin Time: 19.1 seconds — ABNORMAL HIGH (ref 11.6–15.2)

## 2012-02-08 LAB — RAPID URINE DRUG SCREEN, HOSP PERFORMED
Amphetamines: NOT DETECTED
Barbiturates: NOT DETECTED
Benzodiazepines: NOT DETECTED
Cocaine: NOT DETECTED

## 2012-02-08 LAB — HEMOGLOBIN A1C: Mean Plasma Glucose: 166 mg/dL — ABNORMAL HIGH (ref <117)

## 2012-02-08 MED ORDER — LEVALBUTEROL HCL 0.63 MG/3ML IN NEBU
0.6300 mg | INHALATION_SOLUTION | Freq: Four times a day (QID) | RESPIRATORY_TRACT | Status: DC | PRN
  Filled 2012-02-08: qty 3

## 2012-02-08 MED ORDER — SODIUM CHLORIDE 0.9 % IJ SOLN
3.0000 mL | Freq: Two times a day (BID) | INTRAMUSCULAR | Status: DC
  Administered 2012-02-09 (×2): 3 mL via INTRAVENOUS

## 2012-02-08 MED ORDER — SODIUM CHLORIDE 0.9 % IJ SOLN: 3.0000 mL | INTRAMUSCULAR | Status: DC | PRN

## 2012-02-08 MED ORDER — ACETAMINOPHEN 650 MG RE SUPP: 650.0000 mg | Freq: Four times a day (QID) | RECTAL | Status: DC | PRN

## 2012-02-08 MED ORDER — SIMVASTATIN 5 MG PO TABS
5.0000 mg | ORAL_TABLET | Freq: Every day | ORAL | Status: DC
  Administered 2012-02-08 – 2012-02-09 (×2): 5 mg via ORAL
  Filled 2012-02-08 (×3): qty 1

## 2012-02-08 MED ORDER — IOHEXOL 350 MG/ML SOLN
100.0000 mL | Freq: Once | INTRAVENOUS | Status: AC | PRN
  Administered 2012-02-08: 100 mL via INTRAVENOUS

## 2012-02-08 MED ORDER — METOPROLOL TARTRATE 50 MG PO TABS
50.0000 mg | ORAL_TABLET | Freq: Two times a day (BID) | ORAL | Status: DC
  Administered 2012-02-08 – 2012-02-10 (×5): 50 mg via ORAL
  Filled 2012-02-08 (×6): qty 1

## 2012-02-08 MED ORDER — NITROGLYCERIN 2 % TD OINT
1.0000 [in_us] | TOPICAL_OINTMENT | Freq: Four times a day (QID) | TRANSDERMAL | Status: DC
  Administered 2012-02-08 – 2012-02-10 (×8): 1 [in_us] via TOPICAL
  Filled 2012-02-08: qty 30

## 2012-02-08 MED ORDER — ONDANSETRON HCL 4 MG PO TABS: 4.0000 mg | ORAL_TABLET | Freq: Four times a day (QID) | ORAL | Status: DC | PRN

## 2012-02-08 MED ORDER — GLIMEPIRIDE 4 MG PO TABS
4.0000 mg | ORAL_TABLET | Freq: Two times a day (BID) | ORAL | Status: DC
  Administered 2012-02-09 – 2012-02-10 (×3): 4 mg via ORAL
  Filled 2012-02-08 (×6): qty 1

## 2012-02-08 MED ORDER — INSULIN ASPART 100 UNIT/ML ~~LOC~~ SOLN
0.0000 [IU] | Freq: Three times a day (TID) | SUBCUTANEOUS | Status: DC
  Administered 2012-02-09 (×2): 5 [IU] via SUBCUTANEOUS
  Administered 2012-02-09: 100 [IU] via SUBCUTANEOUS
  Administered 2012-02-10: 5 [IU] via SUBCUTANEOUS
  Administered 2012-02-10: 3 [IU] via SUBCUTANEOUS

## 2012-02-08 MED ORDER — ASPIRIN EC 81 MG PO TBEC
81.0000 mg | DELAYED_RELEASE_TABLET | Freq: Every day | ORAL | Status: DC
  Administered 2012-02-08 – 2012-02-10 (×3): 81 mg via ORAL
  Filled 2012-02-08 (×3): qty 1

## 2012-02-08 MED ORDER — ACETAMINOPHEN 325 MG PO TABS
650.0000 mg | ORAL_TABLET | Freq: Four times a day (QID) | ORAL | Status: DC | PRN
  Administered 2012-02-10: 650 mg via ORAL
  Filled 2012-02-08: qty 2

## 2012-02-08 MED ORDER — PANTOPRAZOLE SODIUM 40 MG PO TBEC
40.0000 mg | DELAYED_RELEASE_TABLET | Freq: Every day | ORAL | Status: DC
  Administered 2012-02-08 – 2012-02-10 (×3): 40 mg via ORAL
  Filled 2012-02-08 (×3): qty 1

## 2012-02-08 MED ORDER — SODIUM CHLORIDE 0.9 % IV SOLN: 250.0000 mL | INTRAVENOUS | Status: DC | PRN

## 2012-02-08 MED ORDER — ONDANSETRON HCL 4 MG/2ML IJ SOLN: 4.0000 mg | Freq: Four times a day (QID) | INTRAMUSCULAR | Status: DC | PRN

## 2012-02-08 MED ORDER — POLYETHYLENE GLYCOL 3350 17 G PO PACK
17.0000 g | PACK | Freq: Two times a day (BID) | ORAL | Status: DC
  Administered 2012-02-08 – 2012-02-10 (×4): 17 g via ORAL
  Filled 2012-02-08 (×5): qty 1

## 2012-02-08 MED ORDER — DOCUSATE SODIUM 100 MG PO CAPS
100.0000 mg | ORAL_CAPSULE | Freq: Two times a day (BID) | ORAL | Status: DC
Start: 2012-02-08 — End: 2012-02-10
  Administered 2012-02-08 – 2012-02-10 (×4): 100 mg via ORAL
  Filled 2012-02-08 (×6): qty 1

## 2012-02-08 MED ORDER — FUROSEMIDE 10 MG/ML IJ SOLN
60.0000 mg | Freq: Once | INTRAMUSCULAR | Status: AC
  Administered 2012-02-08: 60 mg via INTRAVENOUS
  Filled 2012-02-08: qty 8

## 2012-02-08 MED ORDER — SODIUM CHLORIDE 0.9 % IV SOLN
1000.0000 mL | INTRAVENOUS | Status: DC
  Administered 2012-02-08: 1000 mL via INTRAVENOUS

## 2012-02-08 MED ORDER — SODIUM CHLORIDE 0.9 % IJ SOLN
3.0000 mL | Freq: Two times a day (BID) | INTRAMUSCULAR | Status: DC
  Administered 2012-02-08 – 2012-02-09 (×2): 3 mL via INTRAVENOUS

## 2012-02-08 MED ORDER — ENOXAPARIN SODIUM 40 MG/0.4ML ~~LOC~~ SOLN
40.0000 mg | SUBCUTANEOUS | Status: DC
  Administered 2012-02-08: 40 mg via SUBCUTANEOUS
  Filled 2012-02-08: qty 0.4

## 2012-02-08 NOTE — H&P (Addendum)
Lindsey Frost MRN: [id number] DOB/AGE: [birth year] 71 y.o. Primary Care Physician:No primary provider on file. Admit date: 02/08/2012 Chief Complaint: Altered mental status Primary cardiologist is in [place] with [facility] HPI: 71 year old female status post left total knee replacement and history of hypertension dyslipidemia diabetes, sleep apnea history of coronary artery disease with cardiac stent placed in 2006. Cleared by cardiology prior to her surgery, who presents with altered mental status. The patient was discharged home yesterday, has been given 2 Vicodin and 10 PM, apparently the patient slept all night. She had no chest pain or shortness of breath, no palpitations no dizziness or syncopal episodes. She was found to be hypoxic with 85% when she was brought in by EMS. She was also found to be difficult to arouse. She received 2 mg of Narcan without any significant change in her mental status. Upon admission the patient was found to have abnormal cardiac enzymes, pulmonary vascular congestion   Past Medical History  Diagnosis Date  . Diabetes mellitus   . Hypertension   . Coronary artery disease   . GERD (gastroesophageal reflux disease)   . Hyperlipidemia   . Sleep apnea     PT HAS CPAP MASK AND MACHINE AT HOME--BUT DOES NOT USE-COULD NOT TOLERATE  . Cancer     UTERINE CANCER - S/P HYSTERECTOMY  . Myocardial infarction JULY 2006    STENT PLACEMENT [facility]  . Arthritis     OA AND PAIN LEFT KNEE  AND ARTHRITIS IN FINGERS    Past Surgical History  Procedure Date  . Left knee arthroscopy 2012  . Abdominal hysterectomy 2000    UTERINE CANCER  . Bilateral cataract extractions 2005  . Coronary angioplasty     Prior to Admission medications   Medication Sig Start Date End Date Taking? Authorizing Provider  amLODipine (NORVASC) 10 MG tablet Take 10 mg by mouth every morning.    Yes Historical Provider, MD  Calcium-Vitamin D (CALTRATE 600 PLUS-VIT D PO) Take  1 tablet by mouth 2 (two) times daily.   Yes Historical Provider, MD  Docusate Sodium (DSS) 100 MG CAPS Take 100 mg by mouth 2 (two) times daily. 02/07/12 02/17/12 Yes Genelle Gather Babish, PA  ferrous sulfate 325 (65 FE) MG tablet Take 325 mg by mouth 3 (three) times daily after meals. 02/07/12 02/06/13 Yes Genelle Gather Babish, PA  glimepiride (AMARYL) 4 MG tablet Take 4 mg by mouth 2 (two) times daily.   Yes Historical Provider, MD  Glucosamine-Chondroitin (GLUCOSAMINE CHONDR COMPLEX PO) Take 1 tablet by mouth 2 (two) times daily. 1500 GLUCOSAMINE AND 1200 CHONDROITIN   Yes Historical Provider, MD  HYDROcodone-acetaminophen (NORCO) 7.5-325 MG per tablet Take 1-2 tablets by mouth every 4 (four) hours as needed. For pain. 02/07/12 02/17/12 Yes Genelle Gather Babish, PA  isosorbide mononitrate (IMDUR) 60 MG 24 hr tablet Take 60 mg by mouth daily.   Yes Historical Provider, MD  losartan-hydrochlorothiazide (HYZAAR) 100-25 MG per tablet Take 1 tablet by mouth daily.   Yes Historical Provider, MD  metFORMIN (GLUCOPHAGE) 1000 MG tablet Take 1,000 mg by mouth 2 (two) times daily with a meal.   Yes Historical Provider, MD  methocarbamol (ROBAXIN) 500 MG tablet Take 500 mg by mouth every 6 (six) hours as needed. For muscle spasms. 02/07/12 02/17/12 Yes Genelle Gather Babish, PA  metoprolol (LOPRESSOR) 50 MG tablet Take 50 mg by mouth 2 (two) times daily.   Yes Historical Provider, MD  pantoprazole (PROTONIX) 40 MG tablet Take 40 mg  by mouth daily.   Yes Historical Provider, MD  polyethylene glycol (MIRALAX / GLYCOLAX) packet Take 17 g by mouth 2 (two) times daily. 02/07/12 02/10/12 Yes Genelle Gather Babish, PA  pravastatin (PRAVACHOL) 40 MG tablet Take 40 mg by mouth at bedtime.   Yes Historical Provider, MD  aspirin EC 325 MG tablet Take 325 mg by mouth 2 (two) times daily. She is to take for 4 weeks starting on 02/07/12.    Historical Provider, MD    Allergies: No Known Allergies     FAMILY HISTORY: Positive for atrial  fibrillation and hypertension.   SOCIAL HISTORY: The patient is married. She is retired. She does not  smoke and does not drink and again plans on going home after surgery.    ROS: A complete 14 point review of systems was done with pertinent positives listed in history of present illness, otherwise a complete review of systems was found to be negative Constitutional: Negative for fever.  Respiratory: Negative for cough.  Cardiovascular: Negative for chest pain.  Genitourinary: Negative for dysuria.  All other systems reviewed and are negative.  PHYSICAL EXAM: Blood pressure 124/78, pulse 95, temperature 98 F (36.7 C), temperature source Oral, resp. rate 20, SpO2 92.00%. Constitutional: She appears well-developed and well-nourished. She appears listless. Non-toxic appearance. She does not appear ill. No distress.  HENT:  Head: Normocephalic and atraumatic.  Right Ear: External ear normal.  Left Ear: External ear normal.  Mouth/Throat: No oropharyngeal exudate.  Eyes: Conjunctivae are normal. Right eye exhibits no discharge. Left eye exhibits no discharge. No scleral icterus.  Neck: Neck supple. No tracheal deviation present.  Cardiovascular: Regular rhythm and intact distal pulses. Tachycardia present.  Murmur heard.  Systolic murmur is present  Pulmonary/Chest: Effort normal and breath sounds normal. No stridor. No respiratory distress. She has no wheezes. She has no rales.  Hypoxic  Abdominal: Soft. Bowel sounds are normal. She exhibits no distension. There is no tenderness. There is no rebound and no guarding.  Musculoskeletal: She exhibits edema. She exhibits no tenderness.  Postoperative changes left knee, edema of the left knee area and into the calf,  Neurological: She has normal strength. She appears listless. No sensory deficit. Cranial nerve deficit: no gross defecits noted. She exhibits normal muscle tone. She displays no seizure activity. Coordination normal.  Skin:  Skin is warm and dry. No rash noted.  Psychiatric: She has a normal mood and affect.    Recent Results (from the past 240 hour(s))  SURGICAL PCR SCREEN     Status: Normal   Collection Time   01/30/12 10:04 AM      Component Value Range Status Comment   MRSA, PCR NEGATIVE  NEGATIVE Final    Staphylococcus aureus NEGATIVE  NEGATIVE Final      Results for orders placed during the hospital encounter of 02/08/12 (from the past 48 hour(s))  URINALYSIS, ROUTINE W REFLEX MICROSCOPIC     Status: Abnormal   Collection Time   02/08/12  9:45 AM      Component Value Range Comment   Color, Urine YELLOW  YELLOW    APPearance CLOUDY (*) CLEAR    Specific Gravity, Urine 1.023  1.005 - 1.030    pH 5.0  5.0 - 8.0    Glucose, UA 100 (*) NEGATIVE mg/dL    Hgb urine dipstick MODERATE (*) NEGATIVE    Bilirubin Urine NEGATIVE  NEGATIVE    Ketones, ur NEGATIVE  NEGATIVE mg/dL    Protein, ur NEGATIVE  NEGATIVE mg/dL    Urobilinogen, UA 0.2  0.0 - 1.0 mg/dL    Nitrite NEGATIVE  NEGATIVE    Leukocytes, UA NEGATIVE  NEGATIVE   URINE MICROSCOPIC-ADD ON     Status: Abnormal   Collection Time   02/08/12  9:45 AM      Component Value Range Comment   Squamous Epithelial / LPF RARE  RARE    RBC / HPF 3-6  <3 RBC/hpf    Crystals URIC ACID CRYSTALS (*) NEGATIVE    Urine-Other AMORPHOUS URATES/PHOSPHATES     GLUCOSE, CAPILLARY     Status: Abnormal   Collection Time   02/08/12 10:06 AM      Component Value Range Comment   Glucose-Capillary 225 (*) 70 - 99 mg/dL   CBC     Status: Abnormal   Collection Time   02/08/12 10:10 AM      Component Value Range Comment   WBC 13.3 (*) 4.0 - 10.5 K/uL    RBC 3.96  3.87 - 5.11 MIL/uL    Hemoglobin 10.1 (*) 12.0 - 15.0 g/dL    HCT 16.1 (*) 09.6 - 46.0 %    MCV 81.1  78.0 - 100.0 fL    MCH 25.5 (*) 26.0 - 34.0 pg    MCHC 31.5  30.0 - 36.0 g/dL    RDW 04.5 (*) 40.9 - 15.5 %    Platelets 342  150 - 400 K/uL   DIFFERENTIAL     Status: Abnormal   Collection Time   02/08/12  10:10 AM      Component Value Range Comment   Neutrophils Relative 88 (*) 43 - 77 %    Neutro Abs 11.7 (*) 1.7 - 7.7 K/uL    Lymphocytes Relative 5 (*) 12 - 46 %    Lymphs Abs 0.6 (*) 0.7 - 4.0 K/uL    Monocytes Relative 7  3 - 12 %    Monocytes Absolute 1.0  0.1 - 1.0 K/uL    Eosinophils Relative 0  0 - 5 %    Eosinophils Absolute 0.0  0.0 - 0.7 K/uL    Basophils Relative 0  0 - 1 %    Basophils Absolute 0.0  0.0 - 0.1 K/uL   COMPREHENSIVE METABOLIC PANEL     Status: Abnormal   Collection Time   02/08/12 10:10 AM      Component Value Range Comment   Sodium 134 (*) 135 - 145 mEq/L    Potassium 4.0  3.5 - 5.1 mEq/L    Chloride 99  96 - 112 mEq/L    CO2 23  19 - 32 mEq/L    Glucose, Bld 232 (*) 70 - 99 mg/dL    BUN 18  6 - 23 mg/dL    Creatinine, Ser 8.11  0.50 - 1.10 mg/dL    Calcium 9.7  8.4 - 91.4 mg/dL    Total Protein 6.7  6.0 - 8.3 g/dL    Albumin 3.0 (*) 3.5 - 5.2 g/dL    AST 17  0 - 37 U/L    ALT 17  0 - 35 U/L    Alkaline Phosphatase 69  39 - 117 U/L    Total Bilirubin 0.5  0.3 - 1.2 mg/dL    GFR calc non Af Amer 71 (*) >90 mL/min    GFR calc Af Amer 82 (*) >90 mL/min   PROTIME-INR     Status: Abnormal   Collection Time   02/08/12 10:10 AM  Component Value Range Comment   Prothrombin Time 19.1 (*) 11.6 - 15.2 seconds    INR 1.57 (*) 0.00 - 1.49   APTT     Status: Abnormal   Collection Time   02/08/12 10:10 AM      Component Value Range Comment   aPTT 41 (*) 24 - 37 seconds   LACTIC ACID, PLASMA     Status: Normal   Collection Time   02/08/12 10:10 AM      Component Value Range Comment   Lactic Acid, Venous 1.9  0.5 - 2.2 mmol/L   PROCALCITONIN     Status: Normal   Collection Time   02/08/12 10:10 AM      Component Value Range Comment   Procalcitonin 0.13     D-DIMER, QUANTITATIVE     Status: Abnormal   Collection Time   02/08/12 10:10 AM      Component Value Range Comment   D-Dimer, Quant 0.80 (*) 0.00 - 0.48 ug/mL-FEU   BLOOD GAS, ARTERIAL     Status:  Abnormal   Collection Time   02/08/12 10:14 AM      Component Value Range Comment   O2 Content 2.0      Delivery systems NASAL CANNULA      pH, Arterial 7.379  7.350 - 7.450    pCO2 arterial 42.1  35.0 - 45.0 mmHg    pO2, Arterial 61.0 (*) 80.0 - 100.0 mmHg    Bicarbonate 24.3 (*) 20.0 - 24.0 mEq/L    TCO2 22.2  0 - 100 mmol/L    Acid-base deficit 0.3  0.0 - 2.0 mmol/L    O2 Saturation 91.1      Patient temperature 98.6      Collection site RIGHT RADIAL      Drawn by [id number]      Sample type ARTERIAL DRAW      Allens test (pass/fail) PASS  PASS   PRO B NATRIURETIC PEPTIDE     Status: Abnormal   Collection Time   02/08/12 11:15 AM      Component Value Range Comment   Pro B Natriuretic peptide (BNP) 1301.0 (*) 0 - 125 pg/mL   MAGNESIUM     Status: Normal   Collection Time   02/08/12 12:49 PM      Component Value Range Comment   Magnesium 1.9  1.5 - 2.5 mg/dL   URINE RAPID DRUG SCREEN (HOSP PERFORMED)     Status: Abnormal   Collection Time   02/08/12 12:50 PM      Component Value Range Comment   Opiates POSITIVE (*) NONE DETECTED    Cocaine NONE DETECTED  NONE DETECTED    Benzodiazepines NONE DETECTED  NONE DETECTED    Amphetamines NONE DETECTED  NONE DETECTED    Tetrahydrocannabinol NONE DETECTED  NONE DETECTED    Barbiturates NONE DETECTED  NONE DETECTED   CARDIAC PANEL(CRET KIN+CKTOT+MB+TROPI)     Status: Abnormal   Collection Time   02/08/12  1:55 PM      Component Value Range Comment   Total CK 97  7 - 177 U/L    CK, MB 4.4 (*) 0.3 - 4.0 ng/mL    Troponin I 0.71 (*) <0.30 ng/mL    Relative Index RELATIVE INDEX IS INVALID  0.0 - 2.5     Dg Chest 2 View  02/08/2012  *RADIOLOGY REPORT*  Clinical Data: Altered mental status.  CHEST - 2 VIEW  Comparison: Plain films of the chest 01/30/2012.  Findings:  There is cardiomegaly and pulmonary vascular congestion. No consolidative process, pneumothorax or effusion.  Multilevel degenerative disease of the spine noted.  IMPRESSION:  Cardiomegaly and pulmonary vascular congestion.  Original Report Authenticated By: Bernadene Bell. Maricela Curet, M.D.   Dg Chest 2 View  01/30/2012  *RADIOLOGY REPORT*  Clinical Data: Preop for left total knee arthroplasty. Hypertension.  Coronary artery disease.  Diabetes.  CHEST - 2 VIEW  Comparison: 02/22/2005  Findings: Accentuation of expected thoracic kyphosis.  Spondylosis versus diffuse idiopathic skeletal hyperostosis within the thoracic spine.  Midline trachea.  Borderline cardiomegaly.     Mediastinal contours otherwise within normal limits.  No pleural effusion or pneumothorax.  Clear lungs.  No congestive failure.  IMPRESSION: Borderline cardiomegaly, without acute disease.  Original Report Authenticated By: Consuello Bossier, M.D.   Ct Head Wo Contrast  02/08/2012  *RADIOLOGY REPORT*  Clinical Data: Altered mental status.  CT HEAD WITHOUT CONTRAST  Technique:  Contiguous axial images were obtained from the base of the skull through the vertex without contrast.  Comparison: None.  Findings: There is no evidence of acute intracranial abnormality including infarction, hemorrhage, mass lesion, mass effect, midline shift or abnormal extra-axial fluid collection.  There is some chronic microvascular ischemic change.  No hydrocephalus or pneumocephalus.  Imaged paranasal sinuses and mastoid air cells are clear.  Calvarium intact.  IMPRESSION: No acute finding.  Original Report Authenticated By: Bernadene Bell. Maricela Curet, M.D.    Impression:  Active Problems:  CHF exacerbation  Altered awareness, transient  left knee arthroplasty Type 2 diabetes     Plan: #1 non-ST elevation MI,  Given patient's hypoxia, a chest CT is pending to rule out pulmonary embolism, we will continue to monitor her on telemetry and cycle cardiac enzymes. Continue her on aspirin, beta blocker. If the patient had dynamic EKG changes cardiology will be consulted. #2 diabetes the patient will be continued on glimepiride and be started on  sliding scale insulin, check hemoglobin A1c #3 hypertension continue beta blocker, will start nitro paste #4 abnormal pro BNP with pulmonary vascular congestion, 2-D echo to rule out congestive heart failure. She received one dose of Lasix. Continue to monitor. #5 sleep apnea patient uses CPAP at night       [facility] 02/08/2012, 3:14 PM

## 2012-02-08 NOTE — ED Provider Notes (Signed)
History     CSN: [id number] Arrival date & time 02/08/12  0905 First MD Initiated Contact with Patient 02/08/12 0912    CC: AMS  HPI Pt has been having trouble with somnolence and altered mental status.  Pt was discharged from the hospital after recent knee surgery.  She was released from the hospital yesterday. Family states that ever since the discharge she has been somnolent.  She has been taking most relaxants hydrocodone for the last dose that she had was last evening at about 10 PM. Family members were unable to arouse her at 2 AM this morning. We'll metabolic began this morning around 8 AM and was still unable to wake her up they called EMS. Patient was given 2 mg of Narcan without any change. EMS did note that her oxygen level was 85%. She has history of sleep apnea and is supposed to be on BiPAP but the patient generally does not use it because she does not like the mask. She denies any chest pain or shortness of breath. She denies any vomiting, diarrhea headache or fever. Past Medical History  Diagnosis Date  . Diabetes mellitus   . Hypertension   . Coronary artery disease   . GERD (gastroesophageal reflux disease)   . Hyperlipidemia   . Sleep apnea     PT HAS CPAP MASK AND MACHINE AT HOME--BUT DOES NOT USE-COULD NOT TOLERATE  . Cancer     UTERINE CANCER - S/P HYSTERECTOMY  . Myocardial infarction JULY 2006    STENT PLACEMENT [facility]  . Arthritis     OA AND PAIN LEFT KNEE  AND ARTHRITIS IN FINGERS    Past Surgical History  Procedure Date  . Left knee arthroscopy 2012  . Abdominal hysterectomy 2000    UTERINE CANCER  . Bilateral cataract extractions 2005  . Coronary angioplasty     No family history on file.  History  Substance Use Topics  . Smoking status: Never Smoker   . Smokeless tobacco: Never Used  . Alcohol Use: No    OB History    Grav Para Term Preterm Abortions TAB SAB Ect Mult Living                  Review of Systems  Constitutional:  Negative for fever.  Respiratory: Negative for cough.   Cardiovascular: Negative for chest pain.  Genitourinary: Negative for dysuria.  All other systems reviewed and are negative.    Allergies  Review of patient's allergies indicates no known allergies.  Home Medications   Current Outpatient Rx  Name Route Sig Dispense Refill  . AMLODIPINE BESYLATE 10 MG PO TABS Oral Take 10 mg by mouth daily.    . ASPIRIN EC 325 MG PO TBEC Oral Take 1 tablet (325 mg total) by mouth 2 (two) times daily. X 4 weeks 60 tablet 0  . CALTRATE 600 PLUS-VIT D PO Oral Take 1 tablet by mouth 2 (two) times daily.    . DSS 100 MG PO CAPS Oral Take 100 mg by mouth 2 (two) times daily. 10 capsule   . FERROUS SULFATE 325 (65 FE) MG PO TABS Oral Take 1 tablet (325 mg total) by mouth 3 (three) times daily after meals.    Marland Kitchen GLIMEPIRIDE 4 MG PO TABS Oral Take 4 mg by mouth 2 (two) times daily.    Marland Kitchen GLUCOSAMINE CHONDR COMPLEX PO Oral Take 1 tablet by mouth 2 (two) times daily. 1500 GLUCOSAMINE AND 1200 CHONDROITIN    .  HYDROCODONE-ACETAMINOPHEN 7.5-325 MG PO TABS Oral Take 1-2 tablets by mouth every 4 (four) hours as needed for pain. 120 tablet 0  . ISOSORBIDE MONONITRATE ER 60 MG PO TB24 Oral Take 60 mg by mouth daily.    Marland Kitchen LOSARTAN POTASSIUM-HCTZ 100-25 MG PO TABS Oral Take 1 tablet by mouth daily.    Marland Kitchen METFORMIN HCL 1000 MG PO TABS Oral Take 1,000 mg by mouth 2 (two) times daily with a meal.    . METHOCARBAMOL 500 MG PO TABS Oral Take 1 tablet (500 mg total) by mouth every 6 (six) hours as needed (muscle spasms).    . METOPROLOL TARTRATE 50 MG PO TABS Oral Take 50 mg by mouth 2 (two) times daily.    Marland Kitchen PANTOPRAZOLE SODIUM 40 MG PO TBEC Oral Take 40 mg by mouth daily.    Marland Kitchen POLYETHYLENE GLYCOL 3350 PO PACK Oral Take 17 g by mouth 2 (two) times daily. 14 each   . PRAVASTATIN SODIUM 40 MG PO TABS Oral Take 40 mg by mouth at bedtime.      BP 147/68  Pulse 111  Temp 98.2 F (36.8 C) (Oral)  Resp 20  SpO2  90%  Physical Exam  Nursing note and vitals reviewed. Constitutional: She appears well-developed and well-nourished. She appears listless.  Non-toxic appearance. She does not appear ill. No distress.  HENT:  Head: Normocephalic and atraumatic.  Right Ear: External ear normal.  Left Ear: External ear normal.  Mouth/Throat: No oropharyngeal exudate.  Eyes: Conjunctivae are normal. Right eye exhibits no discharge. Left eye exhibits no discharge. No scleral icterus.  Neck: Neck supple. No tracheal deviation present.  Cardiovascular: Regular rhythm and intact distal pulses.  Tachycardia present.   Murmur heard.  Systolic murmur is present  Pulmonary/Chest: Effort normal and breath sounds normal. No stridor. No respiratory distress. She has no wheezes. She has no rales.       Hypoxic  Abdominal: Soft. Bowel sounds are normal. She exhibits no distension. There is no tenderness. There is no rebound and no guarding.  Musculoskeletal: She exhibits edema. She exhibits no tenderness.       Postoperative changes left knee, edema of the left knee area and into the calf,  Neurological: She has normal strength. She appears listless. No sensory deficit. Cranial nerve deficit:  no gross defecits noted. She exhibits normal muscle tone. She displays no seizure activity. Coordination normal.  Skin: Skin is warm and dry. No rash noted.  Psychiatric: She has a normal mood and affect.    ED Course  Procedures (including critical care time)  Rate: 107  Rhythm: sinus tachycardia  QRS Axis: normal  Intervals: normal  ST/T Wave abnormalities: normal  Conduction Disutrbances:none  Narrative Interpretation: borderline R wave progression  Old EKG Reviewed: none available  Medications  0.9 %  sodium chloride infusion (1000 mL Intravenous New Bag/Given 02/08/12 1123)  nitroGLYCERIN (NITROGLYN) 2 % ointment 1 inch (1 inch Topical Given 02/08/12 1153)  aspirin EC tablet 81 mg (not administered)  aspirin EC 325 MG  tablet (not administered)  Docusate Sodium (DSS) 100 MG CAPS (not administered)  ferrous sulfate 325 (65 FE) MG tablet (not administered)  HYDROcodone-acetaminophen (NORCO) 7.5-325 MG per tablet (not administered)  methocarbamol (ROBAXIN) 500 MG tablet (not administered)  polyethylene glycol (MIRALAX / GLYCOLAX) packet (not administered)  furosemide (LASIX) injection 60 mg (60 mg Intravenous Given 02/08/12 1152)    Labs Reviewed  CBC - Abnormal; Notable for the following:    WBC 13.3 (*)  Hemoglobin 10.1 (*)     HCT 32.1 (*)     MCH 25.5 (*)     RDW 15.6 (*)     All other components within normal limits  DIFFERENTIAL - Abnormal; Notable for the following:    Neutrophils Relative 88 (*)     Neutro Abs 11.7 (*)     Lymphocytes Relative 5 (*)     Lymphs Abs 0.6 (*)     All other components within normal limits  COMPREHENSIVE METABOLIC PANEL - Abnormal; Notable for the following:    Sodium 134 (*)     Glucose, Bld 232 (*)     Albumin 3.0 (*)     GFR calc non Af Amer 71 (*)     GFR calc Af Amer 82 (*)     All other components within normal limits  PROTIME-INR - Abnormal; Notable for the following:    Prothrombin Time 19.1 (*)     INR 1.57 (*)     All other components within normal limits  APTT - Abnormal; Notable for the following:    aPTT 41 (*)     All other components within normal limits  URINALYSIS, ROUTINE W REFLEX MICROSCOPIC - Abnormal; Notable for the following:    APPearance CLOUDY (*)     Glucose, UA 100 (*)     Hgb urine dipstick MODERATE (*)     All other components within normal limits  BLOOD GAS, ARTERIAL - Abnormal; Notable for the following:    pO2, Arterial 61.0 (*)     Bicarbonate 24.3 (*)     All other components within normal limits  URINE MICROSCOPIC-ADD ON - Abnormal; Notable for the following:    Crystals URIC ACID CRYSTALS (*)     All other components within normal limits  GLUCOSE, CAPILLARY - Abnormal; Notable for the following:     Glucose-Capillary 225 (*)     All other components within normal limits  LACTIC ACID, PLASMA  PROCALCITONIN  URINE CULTURE   Dg Chest 2 View  02/08/2012  *RADIOLOGY REPORT*  Clinical Data: Altered mental status.  CHEST - 2 VIEW  Comparison: Plain films of the chest 01/30/2012.  Findings: There is cardiomegaly and pulmonary vascular congestion. No consolidative process, pneumothorax or effusion.  Multilevel degenerative disease of the spine noted.  IMPRESSION: Cardiomegaly and pulmonary vascular congestion.  Original Report Authenticated By: Bernadene Bell. Maricela Curet, M.D.   Ct Head Wo Contrast  02/08/2012  *RADIOLOGY REPORT*  Clinical Data: Altered mental status.  CT HEAD WITHOUT CONTRAST  Technique:  Contiguous axial images were obtained from the base of the skull through the vertex without contrast.  Comparison: None.  Findings: There is no evidence of acute intracranial abnormality including infarction, hemorrhage, mass lesion, mass effect, midline shift or abnormal extra-axial fluid collection.  There is some chronic microvascular ischemic change.  No hydrocephalus or pneumocephalus.  Imaged paranasal sinuses and mastoid air cells are clear.  Calvarium intact.  IMPRESSION: No acute finding.  Original Report Authenticated By: Bernadene Bell. D'ALESSIO, M.D.     1. Pulmonary edema   2. Hypoxia       MDM  Pt with shortness of breath and lethargy. Doubt CVA.  Could be multifactorial.  CXR suggests CHF.  Will add on BNP.  Lasix and NTG ordered.  Plan on admission for further treatment.  D dimer ordered considering her recent surgery.         Celene Kras, MD 02/08/12 1224

## 2012-02-08 NOTE — ED Notes (Signed)
[id number]<AV> Expected date:02/08/12<BR> Expected time: 8:57 AM<BR> Means of arrival:Ambulance<BR> Comments:<BR> AMS with decreased sats

## 2012-02-08 NOTE — Consult Note (Signed)
Primary cardiologist: Dr. Norman Herrlich, [place]  Clinical Summary Lindsey Frost is a 71 y.o.female status post recent left TKA on 7/1 with Dr. Charlann Boxer. She was discharged yesterday, reportedly was very difficult to arouse late evening yesterday to take her medications per husband. EMS was summoned, she was treated with Narcan, transported to the hospital. Oxygen saturation was 85%. She had been treated with muscle relaxants and hydrocodone for pain. We are consulted to see her secondary to abnormal troponin levels.  She does not recall any chest pain or unusual shortness of breath recently, or leading up to her surgical date. She had been seen preoperatively by Dr. Dulce Sellar and cleared to proceed. I do not have any details about recent cardiac testing. She reports stent placement to a single vessel back in 2006 in [facility] in the setting of a myocardial infarction. It sounds as if she has done quite well without recurrent angina or hospitalizations.  She reports compliance with her medications. ECG shows sinus rhythm with poor R wave progression, borderline LVH with nonspecific ST changes. Pro BNP level 1301 with troponin I 0.71 and CK-MB 4.4. She did undergo a CT angiogram in the chest that showed no pulmonary embolus, cardiomegaly with evidence of dense coronary calcifications, trace bilateral pleural effusions and groundglass opacities in the mid to lower lung zones.   No Known Allergies  Medications    . aspirin EC  81 mg Oral Daily  . docusate sodium  100 mg Oral BID  . enoxaparin  40 mg Subcutaneous Q24H  . furosemide  60 mg Intravenous Once  . glimepiride  4 mg Oral BID WC  . insulin aspart  0-15 Units Subcutaneous TID WC  . metoprolol  50 mg Oral BID  . nitroGLYCERIN  1 inch Topical Q6H  . pantoprazole  40 mg Oral Daily  . polyethylene glycol  17 g Oral BID  . simvastatin  5 mg Oral q1800  . sodium chloride  3 mL Intravenous Q12H  . sodium chloride  3 mL Intravenous Q12H    Past  Medical History  Diagnosis Date  . Diabetes mellitus   . Hypertension   . Coronary artery disease     Followed by Dr.Munley  . GERD (gastroesophageal reflux disease)   . Hyperlipidemia   . Sleep apnea     PT HAS CPAP MASK AND MACHINE AT HOME--BUT DOES NOT USE-COULD NOT TOLERATE  . Cancer     UTERINE CANCER - S/P HYSTERECTOMY  . Myocardial infarction JULY 2006    STENT PLACEMENT [facility]  . Arthritis     OA AND PAIN LEFT KNEE  AND ARTHRITIS IN FINGERS    Past Surgical History  Procedure Date  . Left knee arthroscopy 2012  . Abdominal hysterectomy 2000    UTERINE CANCER  . Bilateral cataract extractions 2005  . Coronary angioplasty     Family History  Problem Relation Age of Onset  . Hypertension    . Atrial fibrillation      Social History Lindsey Frost reports that she has never smoked. She has never used smokeless tobacco. Lindsey Frost reports that she does not drink alcohol.  Review of Systems Reports left knee pain following surgery, has been able to ambulate recently. Not typically on analgesic medications. No angina, orthopnea, palpitations, unusual bleeding problems, or syncope. Otherwise negative.  Physical Examination Blood pressure 124/78, pulse 95, temperature 98 F (36.7 C), temperature source Oral, resp. rate 20, height 5\' 1"  (1.549 m), weight 175  lb (79.379 kg), SpO2 92.00%.  Overweight woman in no acute distress. HEENT: Conjunctiva and lids normal, oropharynx clear. Neck: Supple, no elevated JVP or carotid bruits, no thyromegaly. Lungs: Clear to auscultation, nonlabored breathing at rest. Cardiac: Regular rate and rhythm, no S3 or significant systolic murmur, no pericardial rub. Abdomen: Soft, nontender,bowel sounds present, no guarding or rebound. Extremities: No pitting edema, distal pulses 2+. Left knee dressed. Skin: Warm and dry. Musculoskeletal: No kyphosis. Neuropsychiatric: Alert and oriented x3, affect grossly appropriate.   Lab  Results Basic Metabolic Panel:  Lab 02/08/12 1610 02/08/12 1010 02/07/12 0341 02/06/12 0343  NA -- 134* 132* 135  K -- 4.0 3.7 --  CL -- 99 100 99  CO2 -- 23 26 27   GLUCOSE -- 232* 167* 197*  BUN -- 18 15 12   CREATININE -- 0.81 0.92 0.87  CALCIUM -- 9.7 9.0 9.3  MG 1.9 -- -- --  PHOS -- -- -- --   Liver Function Tests:  Lab 02/08/12 1010  AST 17  ALT 17  ALKPHOS 69  BILITOT 0.5  PROT 6.7  ALBUMIN 3.0*   CBC:  Lab 02/08/12 1010 02/07/12 0341 02/06/12 0343  WBC 13.3* 10.8* 13.4*  NEUTROABS 11.7* -- --  HGB 10.1* 9.5* 10.1*  HCT 32.1* 30.7* 32.3*  MCV 81.1 80.4 79.8  PLT 342 289 307   Cardiac Enzymes:  Lab 02/08/12 1355  CKTOTAL 97  CKMB 4.4*  CKMBINDEX --  TROPONINI 0.71*   CBG:  Lab 02/08/12 1654 02/08/12 1006 02/07/12 0729 02/06/12 2149 02/06/12 1709  GLUCAP 111* 225* 175* 179* 150*    Impression  1. Abnormal troponin I, possibly reflective of NSTEMI in perioperative setting, although in the absence of any chest pain or breathlessness. ECG is nonspecific. She did have hypersomnolence potentially due to oversedation with analgesics, although was also hypoxic at the time. Chest CT angiogram demonstrates no pulmonary embolus.  2. Reported history of CAD status post single-vessel stent placement in 2006, details not available at this time. She is followed by Dr. Dulce Sellar in [place], underwent a preoperative evaluation in May. She thinks that she has had a stress test within the last 6 months. Reports no baseline angina or unusual shortness of breath.  3. Status post left TKA on 7/1.  4. Hypertension. Hemodynamically stable.  5. Hyperlipidemia, on statin therapy.  6. Type 2 diabetes mellitus.  7. Obstructive sleep apnea, on CPAP.   Recommendations  Patient on aspirin, Lovenox, Lopressor, nitroglycerin paste, Zocor. She has no active or recent chest pain. Consider increasing Lovenox to treatment dose for the time being, cycle a full set of cardiac markers,  followup ECG and echocardiogram in the morning. Obtain records from Dr. Dulce Sellar regarding her cardiac history and workup. Our service will follow with you and determine if additional cardiac testing is required as an inpatient.   Jonelle Sidle, M.D., F.A.C.C.

## 2012-02-08 NOTE — Progress Notes (Signed)
VASCULAR LAB PRELIMINARY  PRELIMINARY  PRELIMINARY  PRELIMINARY  Bilateral lower extremity venous duplex completed.    Preliminary report:  Bilateral:  No evidence of DVT, superficial thrombosis, or Baker's Cyst.   Lindsey Frost, RVS 02/08/2012, 4:09 PM

## 2012-02-08 NOTE — ED Notes (Signed)
Pt given gingerale per pt request.  Pt tolerating well.

## 2012-02-08 NOTE — Progress Notes (Signed)
Night TR[facility] coverage texted with information regarding rising cardiac enzymes. Cardiology consult done earlier this pm. Lindsey Frost

## 2012-02-08 NOTE — ED Notes (Signed)
EMS reports called to pt unconscious for 66 female and found semiconscious. Meds given at 10 pm of muscle relaxer and hydrocodone. Family unable to arouse at 0200 and EMS called at 0800. Given 2mg  of Narcan. 20 gauge IV in right hand. CBG 222, Pulse O2 85%.

## 2012-02-09 DIAGNOSIS — Z96659 Presence of unspecified artificial knee joint: Secondary | ICD-10-CM

## 2012-02-09 DIAGNOSIS — J811 Chronic pulmonary edema: Secondary | ICD-10-CM

## 2012-02-09 DIAGNOSIS — I517 Cardiomegaly: Secondary | ICD-10-CM

## 2012-02-09 DIAGNOSIS — R404 Transient alteration of awareness: Secondary | ICD-10-CM

## 2012-02-09 LAB — COMPREHENSIVE METABOLIC PANEL
BUN: 17 mg/dL (ref 6–23)
CO2: 28 mEq/L (ref 19–32)
Chloride: 100 mEq/L (ref 96–112)
Creatinine, Ser: 0.82 mg/dL (ref 0.50–1.10)
GFR calc Af Amer: 81 mL/min — ABNORMAL LOW (ref >90)
GFR calc non Af Amer: 70 mL/min — ABNORMAL LOW (ref >90)
Glucose, Bld: 149 mg/dL — ABNORMAL HIGH (ref 70–99)
Total Bilirubin: 0.5 mg/dL (ref 0.3–1.2)

## 2012-02-09 LAB — URINE CULTURE: Culture: NO GROWTH

## 2012-02-09 LAB — CBC
HCT: 28.8 % — ABNORMAL LOW (ref 36.0–46.0)
MCV: 81.1 fL (ref 78.0–100.0)
RBC: 3.55 MIL/uL — ABNORMAL LOW (ref 3.87–5.11)
RDW: 15.7 % — ABNORMAL HIGH (ref 11.5–15.5)
WBC: 9.5 10*3/uL (ref 4.0–10.5)

## 2012-02-09 LAB — CARDIAC PANEL(CRET KIN+CKTOT+MB+TROPI): Total CK: 71 U/L (ref 7–177)

## 2012-02-09 LAB — GLUCOSE, CAPILLARY
Glucose-Capillary: 246 mg/dL — ABNORMAL HIGH (ref 70–99)
Glucose-Capillary: 309 mg/dL — ABNORMAL HIGH (ref 70–99)

## 2012-02-09 MED ORDER — ENOXAPARIN SODIUM 80 MG/0.8ML ~~LOC~~ SOLN
1.0000 mg/kg | Freq: Once | SUBCUTANEOUS | Status: AC
  Administered 2012-02-09: 80 mg via SUBCUTANEOUS
  Filled 2012-02-09: qty 0.8

## 2012-02-09 MED ORDER — POTASSIUM CHLORIDE CRYS ER 20 MEQ PO TBCR
40.0000 meq | EXTENDED_RELEASE_TABLET | Freq: Two times a day (BID) | ORAL | Status: DC
  Administered 2012-02-09 – 2012-02-10 (×3): 40 meq via ORAL
  Filled 2012-02-09 (×4): qty 2

## 2012-02-09 MED ORDER — POTASSIUM CHLORIDE CRYS ER 20 MEQ PO TBCR
20.0000 meq | EXTENDED_RELEASE_TABLET | Freq: Two times a day (BID) | ORAL | Status: DC
  Filled 2012-02-09 (×2): qty 1

## 2012-02-09 MED ORDER — ENOXAPARIN SODIUM 80 MG/0.8ML ~~LOC~~ SOLN
1.0000 mg/kg | Freq: Two times a day (BID) | SUBCUTANEOUS | Status: DC
  Administered 2012-02-09 – 2012-02-10 (×2): 80 mg via SUBCUTANEOUS
  Filled 2012-02-09 (×4): qty 0.8

## 2012-02-09 MED ORDER — FUROSEMIDE 10 MG/ML IJ SOLN
60.0000 mg | Freq: Once | INTRAMUSCULAR | Status: AC
  Administered 2012-02-09: 60 mg via INTRAVENOUS
  Filled 2012-02-09: qty 6

## 2012-02-09 NOTE — Clinical Documentation Improvement (Signed)
CHF DOCUMENTATION CLARIFICATION QUERY  THIS DOCUMENT IS NOT A PERMANENT PART OF THE MEDICAL RECORD  TO RESPOND TO THE THIS QUERY, FOLLOW THE INSTRUCTIONS BELOW:  1. If needed, update documentation for the patient's encounter via the notes activity.  2. Access this query again and click edit on the In [employer].  3. After updating, or not, click F2 to complete all highlighted (required) fields concerning your review. Select "additional documentation in the medical record" OR "no additional documentation provided".  4. Click Sign note button.  5. The deficiency will fall out of your In Basket *Please let us know if you are not able to complete this workflow by phone or e-mail (listed below).  Please update your documentation within the medical record to reflect your response to this query.                                                                                    02/09/12  Dear Dr. [facility],  In a better effort to capture your patient's severity of illness, reflect appropriate length of stay and utilization of resources, a review of the patient medical record has revealed the following indicators the diagnosis of Heart Failure.    Based on your clinical judgment, please clarify and document in a progress note and/or discharge summary the clinical condition associated with the following supporting information:  In responding to this query please exercise your independent judgment.  The fact that a query is asked, does not imply that any particular answer is desired or expected  PLEASE CLARIFY TYPE & ACUITY OF CHF:  Possible Clinical Conditions?  Acute Systolic Congestive Heart Failure Acute Diastolic Congestive Heart Failure Acute Systolic & Diastolic Congestive Heart Failure Acute on Chronic Systolic Congestive Heart Failure Acute on Chronic Diastolic Congestive Heart Failure Acute on Chronic Systolic & Diastolic  Congestive Heart Failure Chronic Systolic  Congestive Heart Failure Chronic Diastolic Congestive Heart Failure Chronic Systolic & Diastolic Congestive Heart Failure Other Condition Cannot Clinically Determine  Supporting Information:  Risk Factors:(As per notes) "CHF exacerbation"  Signs & Symptoms:(As per notes) "abnormal pro BNP with pulmonary vascular congestion" " Hypoxemia"  Diagnostics:(As per notes) 2-D echo to rule out congestive heart failure  Treatment:(As per notes) "She received one dose of Lasix. Continue to monitor"   Reviewed: additional documentation in the medical record in discharge summary  Thank You,  Senaida Ores RN, BSN Clinical Documentation Specialist: [phone] Pager Health Information Management [place]

## 2012-02-09 NOTE — Progress Notes (Signed)
Patients husband very concerned about his wife not getting up and walking. States "this can't be good for her knee". This RN tried to enlighten husband on patients elevated cardiac enzymes and that we would have to clarify with the MD this am as to what patients activity level should be. Ginny Forth

## 2012-02-09 NOTE — Progress Notes (Signed)
ANTICOAGULATION CONSULT NOTE - Initial Consult  Pharmacy Consult for Lovenox Indication: chest pain/ACS  No Known Allergies  Patient Measurements: Height: 5\' 1"  (154.9 cm) Weight: 175 lb (79.379 kg) IBW/kg (Calculated) : 47.8   Vital Signs: Temp: 98.5 F (36.9 C) (07/04 2121) Temp src: Oral (07/04 2121) BP: 129/74 mmHg (07/04 2121) Pulse Rate: 89  (07/04 2121)  Labs:  Basename 02/08/12 2017 02/08/12 1355 02/08/12 1010 02/07/12 0341 02/06/12 0343  HGB -- -- 10.1* 9.5* --  HCT -- -- 32.1* 30.7* 32.3*  PLT -- -- 342 289 307  APTT -- -- 41* -- --  LABPROT -- -- 19.1* -- --  INR -- -- 1.57* -- --  HEPARINUNFRC -- -- -- -- --  CREATININE -- -- 0.81 0.92 0.87  CKTOTAL 106 97 -- -- --  CKMB 6.1* 4.4* -- -- --  TROPONINI 1.32* 0.71* -- -- --    Estimated Creatinine Clearance: 59.9 ml/min (by C-G formula based on Cr of 0.81).   Medical History: Past Medical History  Diagnosis Date  . Diabetes mellitus   . Hypertension   . Coronary artery disease     Followed by Dr.Munley  . GERD (gastroesophageal reflux disease)   . Hyperlipidemia   . Sleep apnea     PT HAS CPAP MASK AND MACHINE AT HOME--BUT DOES NOT USE-COULD NOT TOLERATE  . Cancer     UTERINE CANCER - S/P HYSTERECTOMY  . Myocardial infarction JULY 2006    STENT PLACEMENT [facility]  . Arthritis     OA AND PAIN LEFT KNEE  AND ARTHRITIS IN FINGERS    Medications:  Scheduled:    . aspirin EC  81 mg Oral Daily  . docusate sodium  100 mg Oral BID  . enoxaparin (LOVENOX) injection  1 mg/kg Subcutaneous Once  . furosemide  60 mg Intravenous Once  . glimepiride  4 mg Oral BID WC  . insulin aspart  0-15 Units Subcutaneous TID WC  . metoprolol  50 mg Oral BID  . nitroGLYCERIN  1 inch Topical Q6H  . pantoprazole  40 mg Oral Daily  . polyethylene glycol  17 g Oral BID  . simvastatin  5 mg Oral q1800  . sodium chloride  3 mL Intravenous Q12H  . sodium chloride  3 mL Intravenous Q12H  . DISCONTD: enoxaparin   40 mg Subcutaneous Q24H   Infusions:    . DISCONTD: sodium chloride 1,000 mL (02/08/12 1123)    Assessment: 71 yo s/p TKA on 7/1, with history of CAD, now with elevated troponins.  Goal of Therapy:  Anti-Xa level 0.6-1.2 units/ml 4hrs after LMWH dose given Monitor platelets by anticoagulation protocol: Yes   Plan:   Lovenox 80mg  SQ x1 now (0215)  Pt had 40mg  @ 1731 7/4.  Then 80mg  SQ q12h  6pm/6am.  F/U SCr and levels if warrented.  Susanne Greenhouse R 02/09/2012,2:11 AM

## 2012-02-09 NOTE — Progress Notes (Signed)
Initial visit with pt in response to spiritual care consult.    Provided spiritual and emotional support with pt.  Prayed with pt at pt's request.    Pt expressing frustration around hospitalization and saddened that she is not able to visit her mother.   Pt's mother was recently admitted to care facility following fall.  Mother's 92nd birthday is Monday.  Pt has been visiting mother every day.  Pt wishes to leave hospital as soon as she can to continue caring for mother.  Pt also expressed that she was not used to being off her feet and feels very much unlike herself.    Pt expressed frustration that she had not heard results of testing completed earlier in the day.  Care team had communicated to pt that she should hear results around noon.   Some of this frustration linked to pt's desire to be with mother.   Pt supported by husband, sister and church congregation.  Will continue to follow for support.  Please page as needs arise.     Belva Crome  MDiv, Chaplain    02/09/12 1600  Clinical Encounter Type  Visited With Patient  Visit Type Initial;Psychological support;Spiritual support;Social support  Referral From Nurse  Spiritual Encounters  Spiritual Needs Emotional;Grief support;Prayer

## 2012-02-09 NOTE — Care Management Note (Unsigned)
    Page 1 of 1   02/09/2012     5:36:47 PM   CARE MANAGEMENT NOTE 02/09/2012  Patient:  Lindsey Frost, Lindsey Frost   Account Number:  [id number]  Date Initiated:  02/09/2012  Documentation initiated by:  Lanier Clam  Subjective/Objective Assessment:   ADMITTED W/SOB.READMIT     Action/Plan:   FROM HOME.[facility] WAS GOING TO START SERVICES FROM PRIOR ADMISSION BUT PATIENT WAS REHOSPITALIZED.   Anticipated DC Date:  02/13/2012   Anticipated DC Plan:  HOME W HOME HEALTH SERVICES      DC Planning Services  CM consult      Choice offered to / List presented to:  C-1 Patient           Status of service:  In process, will continue to follow Medicare Important Message given?   (If response is "NO", the following Medicare IM given date fields will be blank) Date Medicare IM given:   Date Additional Medicare IM given:    Discharge Disposition:    Per UR Regulation:  Reviewed for med. necessity/level of care/duration of stay  If discussed at Long Length of Stay Meetings, dates discussed:    Comments:  02/09/12 Izaan Kingbird RN,BSN NCM [phone] [facility]([place]) CONTACTED, & FOLLOWING FOR HHC.WILL NEED HHRN/PT ORDER.

## 2012-02-09 NOTE — Progress Notes (Signed)
TELEMETRY: Reviewed telemetry pt in NSR: Filed Vitals:   02/08/12 1621 02/08/12 2111 02/08/12 2121 02/09/12 0514  BP:   129/74 137/79  Pulse:   89 86  Temp:   98.5 F (36.9 C) 98.5 F (36.9 C)  TempSrc:   Oral Oral  Resp:  18 18 19   Height: 5\' 1"  (1.549 m)     Weight: 79.379 kg (175 lb)     SpO2:   98% 97%    Intake/Output Summary (Last 24 hours) at 02/09/12 0753 Last data filed at 02/09/12 0515  Gross per 24 hour  Intake    120 ml  Output   1725 ml  Net  -1605 ml    SUBJECTIVE Fully awake and alert now. No chest pain or SOB.  LABS: Basic Metabolic Panel:  Basename 02/09/12 0420 02/08/12 1249 02/08/12 1010  NA 137 -- 134*  K 3.2* -- 4.0  CL 100 -- 99  CO2 28 -- 23  GLUCOSE 149* -- 232*  BUN 17 -- 18  CREATININE 0.82 -- 0.81  CALCIUM 9.4 -- 9.7  MG -- 1.9 --  PHOS -- -- --   Liver Function Tests:  Basename 02/09/12 0420 02/08/12 1010  AST 15 17  ALT 14 17  ALKPHOS 60 69  BILITOT 0.5 0.5  PROT 6.1 6.7  ALBUMIN 2.6* 3.0*   No results found for this basename: LIPASE:2,AMYLASE:2 in the last 72 hours CBC:  Basename 02/09/12 0420 02/08/12 1010  WBC 9.5 13.3*  NEUTROABS -- 11.7*  HGB 8.9* 10.1*  HCT 28.8* 32.1*  MCV 81.1 81.1  PLT 287 342   Cardiac Enzymes:  Basename 02/09/12 0420 02/08/12 2017 02/08/12 1355  CKTOTAL 71 106 97  CKMB 3.4 6.1* 4.4*  CKMBINDEX -- -- --  TROPONINI 0.90* 1.32* 0.71*   D-Dimer:  Basename 02/08/12 1010  DDIMER 0.80*   Hemoglobin A1C:  Basename 02/08/12 1249  HGBA1C 7.4*   Thyroid Function Tests:  Basename 02/08/12 1249  TSH 0.381  T4TOTAL --  T3FREE --  THYROIDAB --   Radiology/Studies:  Dg Chest 2 View  02/08/2012  *RADIOLOGY REPORT*  Clinical Data: Altered mental status.  CHEST - 2 VIEW  Comparison: Plain films of the chest 01/30/2012.  Findings: There is cardiomegaly and pulmonary vascular congestion. No consolidative process, pneumothorax or effusion.  Multilevel degenerative disease of the spine  noted.  IMPRESSION: Cardiomegaly and pulmonary vascular congestion.  Original Report Authenticated By: Bernadene Bell. Maricela Curet, M.D.   Dg Chest 2 View  01/30/2012  *RADIOLOGY REPORT*  Clinical Data: Preop for left total knee arthroplasty. Hypertension.  Coronary artery disease.  Diabetes.  CHEST - 2 VIEW  Comparison: 02/22/2005  Findings: Accentuation of expected thoracic kyphosis.  Spondylosis versus diffuse idiopathic skeletal hyperostosis within the thoracic spine.  Midline trachea.  Borderline cardiomegaly.     Mediastinal contours otherwise within normal limits.  No pleural effusion or pneumothorax.  Clear lungs.  No congestive failure.  IMPRESSION: Borderline cardiomegaly, without acute disease.  Original Report Authenticated By: Consuello Bossier, M.D.   Ct Head Wo Contrast  02/08/2012  *RADIOLOGY REPORT*  Clinical Data: Altered mental status.  CT HEAD WITHOUT CONTRAST  Technique:  Contiguous axial images were obtained from the base of the skull through the vertex without contrast.  Comparison: None.  Findings: There is no evidence of acute intracranial abnormality including infarction, hemorrhage, mass lesion, mass effect, midline shift or abnormal extra-axial fluid collection.  There is some chronic microvascular ischemic change.  No hydrocephalus or  pneumocephalus.  Imaged paranasal sinuses and mastoid air cells are clear.  Calvarium intact.  IMPRESSION: No acute finding.  Original Report Authenticated By: Bernadene Bell. Maricela Curet, M.D.   Ct Angio Chest W/cm &/or Wo Cm  02/08/2012  *RADIOLOGY REPORT*  Clinical Data: Shortness of breath.  CT ANGIOGRAPHY CHEST  Technique:  Multidetector CT imaging of the chest using the standard protocol during bolus administration of intravenous contrast. Multiplanar reconstructed images including MIPs were obtained and reviewed to evaluate the vascular anatomy.  Contrast: OMNIPAQUE IOHEXOL 350 MG/ML SOLN  Comparison: Chest x-ray earlier today.  Findings: No filling  defects in the pulmonary arteries to suggest pulmonary emboli.  There is cardiomegaly.  Mild ground-glass opacities in the mid and lower lungs may reflect early edema. There is bibasilar atelectasis and trace bilateral effusions. Dense coronary artery calcifications are present.  Aorta is normal caliber. No mediastinal, hilar, or axillary adenopathy.  Visualized thyroid and chest wall soft tissues unremarkable. Imaging into the upper abdomen shows no acute findings.  No acute bony abnormality.  Degenerative changes in the thoracic spine.  IMPRESSION: No evidence of pulmonary embolus.  Cardiomegaly, coronary artery disease.  Patchy ground-glass opacities in the mid and lower lungs may reflect early interstitial edema. Bibasilar atelectasis and trace bilateral effusions.  Original Report Authenticated By: Cyndie Chime, M.D.    PHYSICAL EXAM General: Well developed,obese, in no acute distress. Head: Normocephalic, atraumatic, sclera non-icteric, no xanthomas, nares are without discharge. Neck: Negative for carotid bruits. JVD not elevated. Lungs: Clear bilaterally to auscultation without wheezes, rales, or rhonchi. Breathing is unlabored. Heart: RRR S1 S2 without murmurs, rubs, or gallops.  Abdomen: Soft, non-tender, non-distended with normoactive bowel sounds. No hepatomegaly. No rebound/guarding. No obvious abdominal masses. Msk:  Strength and tone appears normal for age. Extremities: No clubbing, cyanosis or edema.  Distal pedal pulses are 2+ and equal bilaterally. Left knee with surgical dressing. Neuro: Alert and oriented X 3. Moves all extremities spontaneously. Psych:  Responds to questions appropriately with a normal affect.  ASSESSMENT AND PLAN: 1. NSTEMI based on cardiac enzymes. No recent chest pain or dyspnea. Ecg without acute findings. I suspect this a type 2 injury due to hypoxia. Will check Ecg today. Obtain Echo. Try and get records from Dr. Dulce Sellar in [place] concerning prior cardiac  work up. For the time being will continue medical management with ASA, lopressor, nitropaste, Zocor. Based on current data I wouldn't recommend cardiac cath now but will follow up on above.   2. S/p left TKR  3. CAD with remote stent 2006.   4. Hypersomnolence/ poor responsiveness probably related to oversedation. Associated with hypoxemia. Now resolved.   5. HTN  6. DM type 2  Active Problems:  CHF exacerbation  Altered awareness, transient  Acute myocardial infarction, subendocardial infarction, initial episode of care  Coronary atherosclerosis of native coronary artery  Mixed hyperlipidemia  Essential hypertension, benign    Signed, Charod Slawinski [place] MD,FACC 02/09/2012 8:01 AM

## 2012-02-09 NOTE — Progress Notes (Signed)
*  PRELIMINARY RESULTS* Echocardiogram 2D Echocardiogram has been performed.  Lindsey Frost 02/09/2012, 9:31 AM

## 2012-02-09 NOTE — Progress Notes (Signed)
TRH floor coverage texted with K+ level this am of 3.2 Lindsey Frost, Lindsey Frost

## 2012-02-09 NOTE — Progress Notes (Signed)
Subjective: Asymptomatic Telemetry normal sinus rhythm  Objective: Vital signs in last 24 hours: Filed Vitals:   02/08/12 1621 02/08/12 2111 02/08/12 2121 02/09/12 0514  BP:   129/74 137/79  Pulse:   89 86  Temp:   98.5 F (36.9 C) 98.5 F (36.9 C)  TempSrc:   Oral Oral  Resp:  18 18 19   Height: 5\' 1"  (1.549 m)     Weight: 79.379 kg (175 lb)     SpO2:   98% 97%    Intake/Output Summary (Last 24 hours) at 02/09/12 0911 Last data filed at 02/09/12 0515  Gross per 24 hour  Intake    120 ml  Output   1725 ml  Net  -1605 ml    Weight change:   Overweight woman in no acute distress.  HEENT: Conjunctiva and lids normal, oropharynx clear.  Neck: Supple, no elevated JVP or carotid bruits, no thyromegaly.  Lungs: Clear to auscultation, nonlabored breathing at rest.  Cardiac: Regular rate and rhythm, no S3 or significant systolic murmur, no pericardial rub.  Abdomen: Soft, nontender,bowel sounds present, no guarding or rebound.  Extremities: No pitting edema, distal pulses 2+. Left knee dressed.  Skin: Warm and dry.  Musculoskeletal: No kyphosis.  Neuropsychiatric: Alert and oriented x3, affect grossly appropriate.      Lab Results: Results for orders placed during the hospital encounter of 02/08/12 (from the past 24 hour(s))  URINALYSIS, ROUTINE W REFLEX MICROSCOPIC     Status: Abnormal   Collection Time   02/08/12  9:45 AM      Component Value Range   Color, Urine YELLOW  YELLOW   APPearance CLOUDY (*) CLEAR   Specific Gravity, Urine 1.023  1.005 - 1.030   pH 5.0  5.0 - 8.0   Glucose, UA 100 (*) NEGATIVE mg/dL   Hgb urine dipstick MODERATE (*) NEGATIVE   Bilirubin Urine NEGATIVE  NEGATIVE   Ketones, ur NEGATIVE  NEGATIVE mg/dL   Protein, ur NEGATIVE  NEGATIVE mg/dL   Urobilinogen, UA 0.2  0.0 - 1.0 mg/dL   Nitrite NEGATIVE  NEGATIVE   Leukocytes, UA NEGATIVE  NEGATIVE  URINE MICROSCOPIC-ADD ON     Status: Abnormal   Collection Time   02/08/12  9:45 AM   Component Value Range   Squamous Epithelial / LPF RARE  RARE   RBC / HPF 3-6  <3 RBC/hpf   Crystals URIC ACID CRYSTALS (*) NEGATIVE   Urine-Other AMORPHOUS URATES/PHOSPHATES    GLUCOSE, CAPILLARY     Status: Abnormal   Collection Time   02/08/12 10:06 AM      Component Value Range   Glucose-Capillary 225 (*) 70 - 99 mg/dL  CBC     Status: Abnormal   Collection Time   02/08/12 10:10 AM      Component Value Range   WBC 13.3 (*) 4.0 - 10.5 K/uL   RBC 3.96  3.87 - 5.11 MIL/uL   Hemoglobin 10.1 (*) 12.0 - 15.0 g/dL   HCT 62.1 (*) 30.8 - 65.7 %   MCV 81.1  78.0 - 100.0 fL   MCH 25.5 (*) 26.0 - 34.0 pg   MCHC 31.5  30.0 - 36.0 g/dL   RDW 84.6 (*) 96.2 - 95.2 %   Platelets 342  150 - 400 K/uL  DIFFERENTIAL     Status: Abnormal   Collection Time   02/08/12 10:10 AM      Component Value Range   Neutrophils Relative 88 (*) 43 - 77 %  Neutro Abs 11.7 (*) 1.7 - 7.7 K/uL   Lymphocytes Relative 5 (*) 12 - 46 %   Lymphs Abs 0.6 (*) 0.7 - 4.0 K/uL   Monocytes Relative 7  3 - 12 %   Monocytes Absolute 1.0  0.1 - 1.0 K/uL   Eosinophils Relative 0  0 - 5 %   Eosinophils Absolute 0.0  0.0 - 0.7 K/uL   Basophils Relative 0  0 - 1 %   Basophils Absolute 0.0  0.0 - 0.1 K/uL  COMPREHENSIVE METABOLIC PANEL     Status: Abnormal   Collection Time   02/08/12 10:10 AM      Component Value Range   Sodium 134 (*) 135 - 145 mEq/L   Potassium 4.0  3.5 - 5.1 mEq/L   Chloride 99  96 - 112 mEq/L   CO2 23  19 - 32 mEq/L   Glucose, Bld 232 (*) 70 - 99 mg/dL   BUN 18  6 - 23 mg/dL   Creatinine, Ser 1.61  0.50 - 1.10 mg/dL   Calcium 9.7  8.4 - 09.6 mg/dL   Total Protein 6.7  6.0 - 8.3 g/dL   Albumin 3.0 (*) 3.5 - 5.2 g/dL   AST 17  0 - 37 U/L   ALT 17  0 - 35 U/L   Alkaline Phosphatase 69  39 - 117 U/L   Total Bilirubin 0.5  0.3 - 1.2 mg/dL   GFR calc non Af Amer 71 (*) >90 mL/min   GFR calc Af Amer 82 (*) >90 mL/min  PROTIME-INR     Status: Abnormal   Collection Time   02/08/12 10:10 AM      Component  Value Range   Prothrombin Time 19.1 (*) 11.6 - 15.2 seconds   INR 1.57 (*) 0.00 - 1.49  APTT     Status: Abnormal   Collection Time   02/08/12 10:10 AM      Component Value Range   aPTT 41 (*) 24 - 37 seconds  LACTIC ACID, PLASMA     Status: Normal   Collection Time   02/08/12 10:10 AM      Component Value Range   Lactic Acid, Venous 1.9  0.5 - 2.2 mmol/L  PROCALCITONIN     Status: Normal   Collection Time   02/08/12 10:10 AM      Component Value Range   Procalcitonin 0.13    D-DIMER, QUANTITATIVE     Status: Abnormal   Collection Time   02/08/12 10:10 AM      Component Value Range   D-Dimer, Quant 0.80 (*) 0.00 - 0.48 ug/mL-FEU  BLOOD GAS, ARTERIAL     Status: Abnormal   Collection Time   02/08/12 10:14 AM      Component Value Range   O2 Content 2.0     Delivery systems NASAL CANNULA     pH, Arterial 7.379  7.350 - 7.450   pCO2 arterial 42.1  35.0 - 45.0 mmHg   pO2, Arterial 61.0 (*) 80.0 - 100.0 mmHg   Bicarbonate 24.3 (*) 20.0 - 24.0 mEq/L   TCO2 22.2  0 - 100 mmol/L   Acid-base deficit 0.3  0.0 - 2.0 mmol/L   O2 Saturation 91.1     Patient temperature 98.6     Collection site RIGHT RADIAL     Drawn by [id number]     Sample type ARTERIAL DRAW     Allens test (pass/fail) PASS  PASS  PRO B NATRIURETIC PEPTIDE  Status: Abnormal   Collection Time   02/08/12 11:15 AM      Component Value Range   Pro B Natriuretic peptide (BNP) 1301.0 (*) 0 - 125 pg/mL  MAGNESIUM     Status: Normal   Collection Time   02/08/12 12:49 PM      Component Value Range   Magnesium 1.9  1.5 - 2.5 mg/dL  TSH     Status: Normal   Collection Time   02/08/12 12:49 PM      Component Value Range   TSH 0.381  0.350 - 4.500 uIU/mL  HEMOGLOBIN A1C     Status: Abnormal   Collection Time   02/08/12 12:49 PM      Component Value Range   Hemoglobin A1C 7.4 (*) <5.7 %   Mean Plasma Glucose 166 (*) <117 mg/dL  URINE RAPID DRUG SCREEN (HOSP PERFORMED)     Status: Abnormal   Collection Time   02/08/12 12:50 PM       Component Value Range   Opiates POSITIVE (*) NONE DETECTED   Cocaine NONE DETECTED  NONE DETECTED   Benzodiazepines NONE DETECTED  NONE DETECTED   Amphetamines NONE DETECTED  NONE DETECTED   Tetrahydrocannabinol NONE DETECTED  NONE DETECTED   Barbiturates NONE DETECTED  NONE DETECTED  CARDIAC PANEL(CRET KIN+CKTOT+MB+TROPI)     Status: Abnormal   Collection Time   02/08/12  1:55 PM      Component Value Range   Total CK 97  7 - 177 U/L   CK, MB 4.4 (*) 0.3 - 4.0 ng/mL   Troponin I 0.71 (*) <0.30 ng/mL   Relative Index RELATIVE INDEX IS INVALID  0.0 - 2.5  GLUCOSE, CAPILLARY     Status: Abnormal   Collection Time   02/08/12  4:54 PM      Component Value Range   Glucose-Capillary 111 (*) 70 - 99 mg/dL  CARDIAC PANEL(CRET KIN+CKTOT+MB+TROPI)     Status: Abnormal   Collection Time   02/08/12  8:17 PM      Component Value Range   Total CK 106  7 - 177 U/L   CK, MB 6.1 (*) 0.3 - 4.0 ng/mL   Troponin I 1.32 (*) <0.30 ng/mL   Relative Index 5.8 (*) 0.0 - 2.5  GLUCOSE, CAPILLARY     Status: Abnormal   Collection Time   02/08/12  9:18 PM      Component Value Range   Glucose-Capillary 200 (*) 70 - 99 mg/dL   Comment 1 Documented in Chart     Comment 2 Notify RN    CARDIAC PANEL(CRET KIN+CKTOT+MB+TROPI)     Status: Abnormal   Collection Time   02/09/12  4:20 AM      Component Value Range   Total CK 71  7 - 177 U/L   CK, MB 3.4  0.3 - 4.0 ng/mL   Troponin I 0.90 (*) <0.30 ng/mL   Relative Index RELATIVE INDEX IS INVALID  0.0 - 2.5  CBC     Status: Abnormal   Collection Time   02/09/12  4:20 AM      Component Value Range   WBC 9.5  4.0 - 10.5 K/uL   RBC 3.55 (*) 3.87 - 5.11 MIL/uL   Hemoglobin 8.9 (*) 12.0 - 15.0 g/dL   HCT 96.0 (*) 45.4 - 09.8 %   MCV 81.1  78.0 - 100.0 fL   MCH 25.1 (*) 26.0 - 34.0 pg   MCHC 30.9  30.0 - 36.0 g/dL  RDW 15.7 (*) 11.5 - 15.5 %   Platelets 287  150 - 400 K/uL  COMPREHENSIVE METABOLIC PANEL     Status: Abnormal   Collection Time   02/09/12  4:20 AM       Component Value Range   Sodium 137  135 - 145 mEq/L   Potassium 3.2 (*) 3.5 - 5.1 mEq/L   Chloride 100  96 - 112 mEq/L   CO2 28  19 - 32 mEq/L   Glucose, Bld 149 (*) 70 - 99 mg/dL   BUN 17  6 - 23 mg/dL   Creatinine, Ser 1.61  0.50 - 1.10 mg/dL   Calcium 9.4  8.4 - 09.6 mg/dL   Total Protein 6.1  6.0 - 8.3 g/dL   Albumin 2.6 (*) 3.5 - 5.2 g/dL   AST 15  0 - 37 U/L   ALT 14  0 - 35 U/L   Alkaline Phosphatase 60  39 - 117 U/L   Total Bilirubin 0.5  0.3 - 1.2 mg/dL   GFR calc non Af Amer 70 (*) >90 mL/min   GFR calc Af Amer 81 (*) >90 mL/min  GLUCOSE, CAPILLARY     Status: Abnormal   Collection Time   02/09/12  7:44 AM      Component Value Range   Glucose-Capillary 145 (*) 70 - 99 mg/dL   Comment 1 Notify RN       Micro: Recent Results (from the past 240 hour(s))  SURGICAL PCR SCREEN     Status: Normal   Collection Time   01/30/12 10:04 AM      Component Value Range Status Comment   MRSA, PCR NEGATIVE  NEGATIVE Final    Staphylococcus aureus NEGATIVE  NEGATIVE Final     Studies/Results: Dg Chest 2 View  02/08/2012  *RADIOLOGY REPORT*  Clinical Data: Altered mental status.  CHEST - 2 VIEW  Comparison: Plain films of the chest 01/30/2012.  Findings: There is cardiomegaly and pulmonary vascular congestion. No consolidative process, pneumothorax or effusion.  Multilevel degenerative disease of the spine noted.  IMPRESSION: Cardiomegaly and pulmonary vascular congestion.  Original Report Authenticated By: Bernadene Bell. Maricela Curet, M.D.   Ct Head Wo Contrast  02/08/2012  *RADIOLOGY REPORT*  Clinical Data: Altered mental status.  CT HEAD WITHOUT CONTRAST  Technique:  Contiguous axial images were obtained from the base of the skull through the vertex without contrast.  Comparison: None.  Findings: There is no evidence of acute intracranial abnormality including infarction, hemorrhage, mass lesion, mass effect, midline shift or abnormal extra-axial fluid collection.  There is some chronic  microvascular ischemic change.  No hydrocephalus or pneumocephalus.  Imaged paranasal sinuses and mastoid air cells are clear.  Calvarium intact.  IMPRESSION: No acute finding.  Original Report Authenticated By: Bernadene Bell. Maricela Curet, M.D.   Ct Angio Chest W/cm &/or Wo Cm  02/08/2012  *RADIOLOGY REPORT*  Clinical Data: Shortness of breath.  CT ANGIOGRAPHY CHEST  Technique:  Multidetector CT imaging of the chest using the standard protocol during bolus administration of intravenous contrast. Multiplanar reconstructed images including MIPs were obtained and reviewed to evaluate the vascular anatomy.  Contrast: OMNIPAQUE IOHEXOL 350 MG/ML SOLN  Comparison: Chest x-ray earlier today.  Findings: No filling defects in the pulmonary arteries to suggest pulmonary emboli.  There is cardiomegaly.  Mild ground-glass opacities in the mid and lower lungs may reflect early edema. There is bibasilar atelectasis and trace bilateral effusions. Dense coronary artery calcifications are present.  Aorta is normal  caliber. No mediastinal, hilar, or axillary adenopathy.  Visualized thyroid and chest wall soft tissues unremarkable. Imaging into the upper abdomen shows no acute findings.  No acute bony abnormality.  Degenerative changes in the thoracic spine.  IMPRESSION: No evidence of pulmonary embolus.  Cardiomegaly, coronary artery disease.  Patchy ground-glass opacities in the mid and lower lungs may reflect early interstitial edema. Bibasilar atelectasis and trace bilateral effusions.  Original Report Authenticated By: Cyndie Chime, M.D.    Medications:  Scheduled Meds:   . aspirin EC  81 mg Oral Daily  . docusate sodium  100 mg Oral BID  . enoxaparin (LOVENOX) injection  1 mg/kg Subcutaneous Once  . enoxaparin (LOVENOX) injection  1 mg/kg Subcutaneous Q12H  . furosemide  60 mg Intravenous Once  . glimepiride  4 mg Oral BID WC  . insulin aspart  0-15 Units Subcutaneous TID WC  . metoprolol  50 mg Oral BID  .  nitroGLYCERIN  1 inch Topical Q6H  . pantoprazole  40 mg Oral Daily  . polyethylene glycol  17 g Oral BID  . potassium chloride  20 mEq Oral BID  . simvastatin  5 mg Oral q1800  . sodium chloride  3 mL Intravenous Q12H  . sodium chloride  3 mL Intravenous Q12H  . DISCONTD: enoxaparin  40 mg Subcutaneous Q24H   Continuous Infusions:   . DISCONTD: sodium chloride 1,000 mL (02/08/12 1123)   PRN Meds:.sodium chloride, acetaminophen, acetaminophen, iohexol, levalbuterol, ondansetron (ZOFRAN) IV, ondansetron, sodium chloride   Assessment: Active Problems:  CHF exacerbation  Altered awareness, transient  Acute myocardial infarction, subendocardial infarction, initial episode of care  Coronary atherosclerosis of native coronary artery  Mixed hyperlipidemia  Essential hypertension, benign   Plan: #1 non-ST elevation MI, Given patient's hypoxia, CT negative upon the embolism, cardiology following, recommend 2-D echo, recommend conservative management and her records available, we will continue to monitor her on telemetry and cycle cardiac enzymes. Continue her on aspirin, beta blocker, therapeutic Lovenox. If the patient had dynamic EKG changes cardiology will be consulted.  #2 diabetes the patient will be continued on glimepiride and be started on sliding scale insulin, check hemoglobin A1c  #3 hypertension continue beta blocker, will start nitro paste  #4 abnormal pro BNP with pulmonary vascular congestion, 2-D echo to rule out congestive heart failure. She received one dose of Lasix. Continue to monitor.  #5 sleep apnea patient uses CPAP at night    LOS: 1 day   [facility] 02/09/2012, 9:11 AM

## 2012-02-10 LAB — GLUCOSE, CAPILLARY: Glucose-Capillary: 190 mg/dL — ABNORMAL HIGH (ref 70–99)

## 2012-02-10 LAB — BASIC METABOLIC PANEL
CO2: 33 mEq/L — ABNORMAL HIGH (ref 19–32)
Calcium: 9.5 mg/dL (ref 8.4–10.5)
Chloride: 101 mEq/L (ref 96–112)
GFR calc Af Amer: 81 mL/min — ABNORMAL LOW (ref >90)
Sodium: 140 mEq/L (ref 135–145)

## 2012-02-10 LAB — CBC
MCH: 25.1 pg — ABNORMAL LOW (ref 26.0–34.0)
Platelets: 359 10*3/uL (ref 150–400)
RBC: 3.78 MIL/uL — ABNORMAL LOW (ref 3.87–5.11)
RDW: 15.6 % — ABNORMAL HIGH (ref 11.5–15.5)
WBC: 9 10*3/uL (ref 4.0–10.5)

## 2012-02-10 NOTE — Progress Notes (Signed)
Cm spoke with patient with spouse at bedside. Pt confirmed [facility] to provide [facility] services. Spouse at bedside to assist in home care. Pt request BSC & wheelchair. Cm contacted [facility] via [facility] to request orders. [facility] notified of referral. [facility] to deliver BSC to [place]. Wheelchair delivery scheduled to residence. No further request forwarded to Cm.   Leonie Green [phone]

## 2012-02-10 NOTE — Evaluation (Signed)
Physical Therapy Evaluation Patient Details Name: Lindsey Frost MRN: [id number] DOB: 05/09/1941 Today's Date: 02/10/2012 Time: 7829-5621 PT Time Calculation (min): 40 min  PT Assessment / Plan / Recommendation Clinical Impression  Pt  admitted 1 day after DC from [facility] after LTKA on 02/05/12. pt unresponsive at home and brought to Hospital. pt dx w/ NSTEMI, hypoxia. pt now has been DC to home. pt was able to ambulate a short distance to [place] and back. pt to have HHPT . has DME ordered. to get 3in1 and WC. Spouse is able to provide necessary assitance    PT Assessment  All further PT needs can be met in the next venue of care    Follow Up Recommendations  Home health PT    Barriers to Discharge        Equipment Recommendations  3 in 1 bedside comode    Recommendations for Other Services     Frequency      Precautions / Restrictions Precautions Precautions: Knee Restrictions Weight Bearing Restrictions: Yes LLE Weight Bearing: Weight bearing as tolerated   Pertinent Vitals/Pain 4/10 knee      Mobility       Exercises Total Joint Exercises Quad Sets: AROM;Left;10 reps;Supine Heel Slides: AAROM;Left;10 reps;Supine Straight Leg Raises: AAROM;Left;10 reps;Supine   PT Diagnosis:    PT Problem List:   PT Treatment Interventions:     PT Goals    Visit Information  Last PT Received On: 02/10/12 Assistance Needed: +1    Subjective Data  Subjective: i have not been up since wed. i might get sick Patient Stated Goal: I want to go home   Prior Functioning  Home Living Lives With: Spouse Available Help at Discharge: Family Type of Home: House Home Access: Stairs to enter [employer] of Steps: 2 Home Layout: One level Home Adaptive Equipment: Walker - rolling;Bedside commode/3-in-1;Wheelchair - manual Additional Comments: WC and 3 in 1 ordered. Prior Function Level of Independence: Needs assistance Needs Assistance:  Bathing;Dressing;Toileting;Gait;Transfers Gait Assistance: w/ RW  Transfer Assistance: min A Able to Take Stairs?: Yes Driving: No Communication Communication: No difficulties    Cognition  Overall Cognitive Status: Appears within functional limits for tasks assessed/performed Arousal/Alertness: Awake/alert Orientation Level: Appears intact for tasks assessed Behavior During Session: [facility] for tasks performed    Extremity/Trunk Assessment Right Upper Extremity Assessment RUE ROM/Strength/Tone: [facility] for tasks assessed Left Upper Extremity Assessment LUE ROM/Strength/Tone: WFL for tasks assessed Right Lower Extremity Assessment RLE ROM/Strength/Tone: WFL for tasks assessed RLE Sensation: WFL - Light Touch Left Lower Extremity Assessment LLE ROM/Strength/Tone: Deficits LLE ROM/Strength/Tone Deficits: knee flexion to about 40 degrees. unable to perform SLR LLE Sensation: WFL - Light Touch   Balance    End of Session PT - End of Session Activity Tolerance: Patient tolerated treatment well Patient left: in chair;with call bell/phone within reach;with family/visitor present Nurse Communication: Mobility status  GP     [name] 02/10/2012, 11:16 AM  [phone]

## 2012-02-10 NOTE — Discharge Summary (Addendum)
Physician Discharge Summary  Lindsey Frost MRN: [id number] DOB/AGE: 1940-11-02 71 y.o.  PCP: No primary provider on file.   Admit date: 02/08/2012 Discharge date: 02/10/2012  Discharge Diagnoses:  Non-ST elevation MI S/P left TKA   CHF exacerbation-acute diastolic  Altered awareness, transient  Acute myocardial infarction, subendocardial infarction, initial episode of care  Coronary atherosclerosis of native coronary artery  Mixed hyperlipidemia  Essential hypertension, benign   Medication List  As of 02/10/2012  8:55 AM   TAKE these medications         amLODipine 10 MG tablet   Commonly known as: NORVASC   Take 10 mg by mouth every morning.      aspirin EC 325 MG tablet   Take 325 mg by mouth 2 (two) times daily. She is to take for 4 weeks starting on 02/07/12.      CALTRATE 600 PLUS-VIT D PO   Take 1 tablet by mouth 2 (two) times daily.      DSS 100 MG Caps   Take 100 mg by mouth 2 (two) times daily.      ferrous sulfate 325 (65 FE) MG tablet   Take 325 mg by mouth 3 (three) times daily after meals.      glimepiride 4 MG tablet   Commonly known as: AMARYL   Take 4 mg by mouth 2 (two) times daily.      GLUCOSAMINE CHONDR COMPLEX PO   Take 1 tablet by mouth 2 (two) times daily. 1500 GLUCOSAMINE AND 1200 CHONDROITIN      HYDROcodone-acetaminophen 7.5-325 MG per tablet   Commonly known as: NORCO   Take 1-2 tablets by mouth every 4 (four) hours as needed. For pain.      isosorbide mononitrate 60 MG 24 hr tablet   Commonly known as: IMDUR   Take 60 mg by mouth daily.      losartan-hydrochlorothiazide 100-25 MG per tablet   Commonly known as: HYZAAR   Take 1 tablet by mouth daily.      metFORMIN 1000 MG tablet   Commonly known as: GLUCOPHAGE   Take 1,000 mg by mouth 2 (two) times daily with a meal.      methocarbamol 500 MG tablet   Commonly known as: ROBAXIN   Take 500 mg by mouth every 6 (six) hours as needed. For muscle spasms.      metoprolol 50  MG tablet   Commonly known as: LOPRESSOR   Take 50 mg by mouth 2 (two) times daily.      pantoprazole 40 MG tablet   Commonly known as: PROTONIX   Take 40 mg by mouth daily.      polyethylene glycol packet   Commonly known as: MIRALAX / GLYCOLAX   Take 17 g by mouth 2 (two) times daily.      pravastatin 40 MG tablet   Commonly known as: PRAVACHOL   Take 40 mg by mouth at bedtime.            Discharge Condition: Stable   Disposition: 06-Home-Health Care Svc   Consults: #1 cardiology   Significant Diagnostic Studies: Dg Chest 2 View  02/08/2012  *RADIOLOGY REPORT*  Clinical Data: Altered mental status.  CHEST - 2 VIEW  Comparison: Plain films of the chest 01/30/2012.  Findings: There is cardiomegaly and pulmonary vascular congestion. No consolidative process, pneumothorax or effusion.  Multilevel degenerative disease of the spine noted.  IMPRESSION: Cardiomegaly and pulmonary vascular congestion.  Original Report Authenticated By: Maisie Fus  L. Maricela Curet, M.D.   Dg Chest 2 View  01/30/2012  *RADIOLOGY REPORT*  Clinical Data: Preop for left total knee arthroplasty. Hypertension.  Coronary artery disease.  Diabetes.  CHEST - 2 VIEW  Comparison: 02/22/2005  Findings: Accentuation of expected thoracic kyphosis.  Spondylosis versus diffuse idiopathic skeletal hyperostosis within the thoracic spine.  Midline trachea.  Borderline cardiomegaly.     Mediastinal contours otherwise within normal limits.  No pleural effusion or pneumothorax.  Clear lungs.  No congestive failure.  IMPRESSION: Borderline cardiomegaly, without acute disease.  Original Report Authenticated By: Consuello Bossier, M.D.   Ct Head Wo Contrast  02/08/2012  *RADIOLOGY REPORT*  Clinical Data: Altered mental status.  CT HEAD WITHOUT CONTRAST  Technique:  Contiguous axial images were obtained from the base of the skull through the vertex without contrast.  Comparison: None.  Findings: There is no evidence of acute intracranial  abnormality including infarction, hemorrhage, mass lesion, mass effect, midline shift or abnormal extra-axial fluid collection.  There is some chronic microvascular ischemic change.  No hydrocephalus or pneumocephalus.  Imaged paranasal sinuses and mastoid air cells are clear.  Calvarium intact.  IMPRESSION: No acute finding.  Original Report Authenticated By: Bernadene Bell. Maricela Curet, M.D.   Ct Angio Chest W/cm &/or Wo Cm  02/08/2012  *RADIOLOGY REPORT*  Clinical Data: Shortness of breath.  CT ANGIOGRAPHY CHEST  Technique:  Multidetector CT imaging of the chest using the standard protocol during bolus administration of intravenous contrast. Multiplanar reconstructed images including MIPs were obtained and reviewed to evaluate the vascular anatomy.  Contrast: OMNIPAQUE IOHEXOL 350 MG/ML SOLN  Comparison: Chest x-ray earlier today.  Findings: No filling defects in the pulmonary arteries to suggest pulmonary emboli.  There is cardiomegaly.  Mild ground-glass opacities in the mid and lower lungs may reflect early edema. There is bibasilar atelectasis and trace bilateral effusions. Dense coronary artery calcifications are present.  Aorta is normal caliber. No mediastinal, hilar, or axillary adenopathy.  Visualized thyroid and chest wall soft tissues unremarkable. Imaging into the upper abdomen shows no acute findings.  No acute bony abnormality.  Degenerative changes in the thoracic spine.  IMPRESSION: No evidence of pulmonary embolus.  Cardiomegaly, coronary artery disease.  Patchy ground-glass opacities in the mid and lower lungs may reflect early interstitial edema. Bibasilar atelectasis and trace bilateral effusions.  Original Report Authenticated By: Cyndie Chime, M.D.   2-D echo LV EF: 65% - 70%  ------------------------------------------------------------ Indications: Shortness of breath 786.05.  ------------------------------------------------------------ History: PMH: Coronary artery disease.  Congestive heart failure. PMH: Myocardial infarction. Risk factors: Hypertension. Diabetes mellitus. Dyslipidemia.  ------------------------------------------------------------ Study Conclusions  Left ventricle: The cavity size was normal. Wall thickness was increased in a pattern of moderate LVH. Systolic function was vigorous. The estimated ejection fraction was in the range of 65% to 70%. Doppler parameters are consistent with abnormal left ventricular relaxation (grade 1 diastolic dysfunction).    Microbiology: Recent Results (from the past 240 hour(s))  URINE CULTURE     Status: Normal   Collection Time   02/08/12  9:45 AM      Component Value Range Status Comment   Specimen Description URINE, CATHETERIZED   Final    Special Requests NONE   Final    Culture  Setup Time 02/08/2012 22:51   Final    Colony Count NO GROWTH   Final    Culture NO GROWTH   Final    Report Status 02/09/2012 FINAL   Final  Labs: Results for orders placed during the hospital encounter of 02/08/12 (from the past 48 hour(s))  URINALYSIS, ROUTINE W REFLEX MICROSCOPIC     Status: Abnormal   Collection Time   02/08/12  9:45 AM      Component Value Range Comment   Color, Urine YELLOW  YELLOW    APPearance CLOUDY (*) CLEAR    Specific Gravity, Urine 1.023  1.005 - 1.030    pH 5.0  5.0 - 8.0    Glucose, UA 100 (*) NEGATIVE mg/dL    Hgb urine dipstick MODERATE (*) NEGATIVE    Bilirubin Urine NEGATIVE  NEGATIVE    Ketones, ur NEGATIVE  NEGATIVE mg/dL    Protein, ur NEGATIVE  NEGATIVE mg/dL    Urobilinogen, UA 0.2  0.0 - 1.0 mg/dL    Nitrite NEGATIVE  NEGATIVE    Leukocytes, UA NEGATIVE  NEGATIVE   URINE CULTURE     Status: Normal   Collection Time   02/08/12  9:45 AM      Component Value Range Comment   Specimen Description URINE, CATHETERIZED      Special Requests NONE      Culture  Setup Time 02/08/2012 22:51      Colony Count NO GROWTH      Culture NO GROWTH      Report Status 02/09/2012  FINAL     URINE MICROSCOPIC-ADD ON     Status: Abnormal   Collection Time   02/08/12  9:45 AM      Component Value Range Comment   Squamous Epithelial / LPF RARE  RARE    RBC / HPF 3-6  <3 RBC/hpf    Crystals URIC ACID CRYSTALS (*) NEGATIVE    Urine-Other AMORPHOUS URATES/PHOSPHATES     GLUCOSE, CAPILLARY     Status: Abnormal   Collection Time   02/08/12 10:06 AM      Component Value Range Comment   Glucose-Capillary 225 (*) 70 - 99 mg/dL   CBC     Status: Abnormal   Collection Time   02/08/12 10:10 AM      Component Value Range Comment   WBC 13.3 (*) 4.0 - 10.5 K/uL    RBC 3.96  3.87 - 5.11 MIL/uL    Hemoglobin 10.1 (*) 12.0 - 15.0 g/dL    HCT 43.1 (*) 54.0 - 46.0 %    MCV 81.1  78.0 - 100.0 fL    MCH 25.5 (*) 26.0 - 34.0 pg    MCHC 31.5  30.0 - 36.0 g/dL    RDW 08.6 (*) 76.1 - 15.5 %    Platelets 342  150 - 400 K/uL   DIFFERENTIAL     Status: Abnormal   Collection Time   02/08/12 10:10 AM      Component Value Range Comment   Neutrophils Relative 88 (*) 43 - 77 %    Neutro Abs 11.7 (*) 1.7 - 7.7 K/uL    Lymphocytes Relative 5 (*) 12 - 46 %    Lymphs Abs 0.6 (*) 0.7 - 4.0 K/uL    Monocytes Relative 7  3 - 12 %    Monocytes Absolute 1.0  0.1 - 1.0 K/uL    Eosinophils Relative 0  0 - 5 %    Eosinophils Absolute 0.0  0.0 - 0.7 K/uL    Basophils Relative 0  0 - 1 %    Basophils Absolute 0.0  0.0 - 0.1 K/uL   COMPREHENSIVE METABOLIC PANEL     Status: Abnormal  Collection Time   02/08/12 10:10 AM      Component Value Range Comment   Sodium 134 (*) 135 - 145 mEq/L    Potassium 4.0  3.5 - 5.1 mEq/L    Chloride 99  96 - 112 mEq/L    CO2 23  19 - 32 mEq/L    Glucose, Bld 232 (*) 70 - 99 mg/dL    BUN 18  6 - 23 mg/dL    Creatinine, Ser 1.61  0.50 - 1.10 mg/dL    Calcium 9.7  8.4 - 09.6 mg/dL    Total Protein 6.7  6.0 - 8.3 g/dL    Albumin 3.0 (*) 3.5 - 5.2 g/dL    AST 17  0 - 37 U/L    ALT 17  0 - 35 U/L    Alkaline Phosphatase 69  39 - 117 U/L    Total Bilirubin 0.5  0.3 - 1.2  mg/dL    GFR calc non Af Amer 71 (*) >90 mL/min    GFR calc Af Amer 82 (*) >90 mL/min   PROTIME-INR     Status: Abnormal   Collection Time   02/08/12 10:10 AM      Component Value Range Comment   Prothrombin Time 19.1 (*) 11.6 - 15.2 seconds    INR 1.57 (*) 0.00 - 1.49   APTT     Status: Abnormal   Collection Time   02/08/12 10:10 AM      Component Value Range Comment   aPTT 41 (*) 24 - 37 seconds   LACTIC ACID, PLASMA     Status: Normal   Collection Time   02/08/12 10:10 AM      Component Value Range Comment   Lactic Acid, Venous 1.9  0.5 - 2.2 mmol/L   PROCALCITONIN     Status: Normal   Collection Time   02/08/12 10:10 AM      Component Value Range Comment   Procalcitonin 0.13     D-DIMER, QUANTITATIVE     Status: Abnormal   Collection Time   02/08/12 10:10 AM      Component Value Range Comment   D-Dimer, Quant 0.80 (*) 0.00 - 0.48 ug/mL-FEU   BLOOD GAS, ARTERIAL     Status: Abnormal   Collection Time   02/08/12 10:14 AM      Component Value Range Comment   O2 Content 2.0      Delivery systems NASAL CANNULA      pH, Arterial 7.379  7.350 - 7.450    pCO2 arterial 42.1  35.0 - 45.0 mmHg    pO2, Arterial 61.0 (*) 80.0 - 100.0 mmHg    Bicarbonate 24.3 (*) 20.0 - 24.0 mEq/L    TCO2 22.2  0 - 100 mmol/L    Acid-base deficit 0.3  0.0 - 2.0 mmol/L    O2 Saturation 91.1      Patient temperature 98.6      Collection site RIGHT RADIAL      Drawn by [id number]      Sample type ARTERIAL DRAW      Allens test (pass/fail) PASS  PASS   PRO B NATRIURETIC PEPTIDE     Status: Abnormal   Collection Time   02/08/12 11:15 AM      Component Value Range Comment   Pro B Natriuretic peptide (BNP) 1301.0 (*) 0 - 125 pg/mL   MAGNESIUM     Status: Normal   Collection Time   02/08/12 12:49 PM  Component Value Range Comment   Magnesium 1.9  1.5 - 2.5 mg/dL   TSH     Status: Normal   Collection Time   02/08/12 12:49 PM      Component Value Range Comment   TSH 0.381  0.350 - 4.500 uIU/mL   HEMOGLOBIN  A1C     Status: Abnormal   Collection Time   02/08/12 12:49 PM      Component Value Range Comment   Hemoglobin A1C 7.4 (*) <5.7 %    Mean Plasma Glucose 166 (*) <117 mg/dL   URINE RAPID DRUG SCREEN (HOSP PERFORMED)     Status: Abnormal   Collection Time   02/08/12 12:50 PM      Component Value Range Comment   Opiates POSITIVE (*) NONE DETECTED    Cocaine NONE DETECTED  NONE DETECTED    Benzodiazepines NONE DETECTED  NONE DETECTED    Amphetamines NONE DETECTED  NONE DETECTED    Tetrahydrocannabinol NONE DETECTED  NONE DETECTED    Barbiturates NONE DETECTED  NONE DETECTED   CARDIAC PANEL(CRET KIN+CKTOT+MB+TROPI)     Status: Abnormal   Collection Time   02/08/12  1:55 PM      Component Value Range Comment   Total CK 97  7 - 177 U/L    CK, MB 4.4 (*) 0.3 - 4.0 ng/mL    Troponin I 0.71 (*) <0.30 ng/mL    Relative Index RELATIVE INDEX IS INVALID  0.0 - 2.5   GLUCOSE, CAPILLARY     Status: Abnormal   Collection Time   02/08/12  4:54 PM      Component Value Range Comment   Glucose-Capillary 111 (*) 70 - 99 mg/dL   CARDIAC PANEL(CRET KIN+CKTOT+MB+TROPI)     Status: Abnormal   Collection Time   02/08/12  8:17 PM      Component Value Range Comment   Total CK 106  7 - 177 U/L    CK, MB 6.1 (*) 0.3 - 4.0 ng/mL    Troponin I 1.32 (*) <0.30 ng/mL    Relative Index 5.8 (*) 0.0 - 2.5   GLUCOSE, CAPILLARY     Status: Abnormal   Collection Time   02/08/12  9:18 PM      Component Value Range Comment   Glucose-Capillary 200 (*) 70 - 99 mg/dL    Comment 1 Documented in Chart      Comment 2 Notify RN     CARDIAC PANEL(CRET KIN+CKTOT+MB+TROPI)     Status: Abnormal   Collection Time   02/09/12  4:20 AM      Component Value Range Comment   Total CK 71  7 - 177 U/L    CK, MB 3.4  0.3 - 4.0 ng/mL    Troponin I 0.90 (*) <0.30 ng/mL    Relative Index RELATIVE INDEX IS INVALID  0.0 - 2.5   CBC     Status: Abnormal   Collection Time   02/09/12  4:20 AM      Component Value Range Comment   WBC 9.5  4.0 - 10.5  K/uL    RBC 3.55 (*) 3.87 - 5.11 MIL/uL    Hemoglobin 8.9 (*) 12.0 - 15.0 g/dL    HCT 40.9 (*) 81.1 - 46.0 %    MCV 81.1  78.0 - 100.0 fL    MCH 25.1 (*) 26.0 - 34.0 pg    MCHC 30.9  30.0 - 36.0 g/dL    RDW 91.4 (*) 78.2 - 15.5 %  Platelets 287  150 - 400 K/uL   COMPREHENSIVE METABOLIC PANEL     Status: Abnormal   Collection Time   02/09/12  4:20 AM      Component Value Range Comment   Sodium 137  135 - 145 mEq/L    Potassium 3.2 (*) 3.5 - 5.1 mEq/L    Chloride 100  96 - 112 mEq/L    CO2 28  19 - 32 mEq/L    Glucose, Bld 149 (*) 70 - 99 mg/dL    BUN 17  6 - 23 mg/dL    Creatinine, Ser 5.78  0.50 - 1.10 mg/dL    Calcium 9.4  8.4 - 46.9 mg/dL    Total Protein 6.1  6.0 - 8.3 g/dL    Albumin 2.6 (*) 3.5 - 5.2 g/dL    AST 15  0 - 37 U/L    ALT 14  0 - 35 U/L    Alkaline Phosphatase 60  39 - 117 U/L    Total Bilirubin 0.5  0.3 - 1.2 mg/dL    GFR calc non Af Amer 70 (*) >90 mL/min    GFR calc Af Amer 81 (*) >90 mL/min   GLUCOSE, CAPILLARY     Status: Abnormal   Collection Time   02/09/12  7:44 AM      Component Value Range Comment   Glucose-Capillary 145 (*) 70 - 99 mg/dL    Comment 1 Notify RN     GLUCOSE, CAPILLARY     Status: Abnormal   Collection Time   02/09/12 11:36 AM      Component Value Range Comment   Glucose-Capillary 210 (*) 70 - 99 mg/dL    Comment 1 Notify RN     GLUCOSE, CAPILLARY     Status: Abnormal   Collection Time   02/09/12  4:33 PM      Component Value Range Comment   Glucose-Capillary 246 (*) 70 - 99 mg/dL    Comment 1 Documented in Chart      Comment 2 Notify RN     GLUCOSE, CAPILLARY     Status: Abnormal   Collection Time   02/09/12  9:53 PM      Component Value Range Comment   Glucose-Capillary 309 (*) 70 - 99 mg/dL   CBC     Status: Abnormal   Collection Time   02/10/12  5:35 AM      Component Value Range Comment   WBC 9.0  4.0 - 10.5 K/uL    RBC 3.78 (*) 3.87 - 5.11 MIL/uL    Hemoglobin 9.5 (*) 12.0 - 15.0 g/dL    HCT 62.9 (*) 52.8 - 46.0 %     MCV 80.7  78.0 - 100.0 fL    MCH 25.1 (*) 26.0 - 34.0 pg    MCHC 31.1  30.0 - 36.0 g/dL    RDW 41.3 (*) 24.4 - 15.5 %    Platelets 359  150 - 400 K/uL   BASIC METABOLIC PANEL     Status: Abnormal   Collection Time   02/10/12  5:35 AM      Component Value Range Comment   Sodium 140  135 - 145 mEq/L    Potassium 4.4  3.5 - 5.1 mEq/L    Chloride 101  96 - 112 mEq/L    CO2 33 (*) 19 - 32 mEq/L    Glucose, Bld 185 (*) 70 - 99 mg/dL    BUN 16  6 - 23 mg/dL  Creatinine, Ser 0.82  0.50 - 1.10 mg/dL    Calcium 9.5  8.4 - 16.1 mg/dL    GFR calc non Af Amer 70 (*) >90 mL/min    GFR calc Af Amer 81 (*) >90 mL/min   GLUCOSE, CAPILLARY     Status: Abnormal   Collection Time   02/10/12  7:16 AM      Component Value Range Comment   Glucose-Capillary 190 (*) 70 - 99 mg/dL      HPI :71 y.o.female status post recent left TKA on 7/1 with Dr. Charlann Boxer. She was discharged yesterday, reportedly was very difficult to arouse late evening yesterday to take her medications per husband. EMS was summoned, she was treated with Narcan, transported to the hospital. Oxygen saturation was 85%. She had been treated with muscle relaxants and hydrocodone for pain. Cardiology was consulted secondary to abnormal troponin levels.  She does not recall any chest pain or unusual shortness of breath recently, or leading up to her surgical date. She had been seen preoperatively by Dr. Dulce Sellar and cleared to proceed.She reports stent placement to a single vessel back in 2006 in [facility] in the setting of a myocardial infarction. It sounds as if she has done quite well without recurrent angina or hospitalizations.  She reports compliance with her medications. ECG shows sinus rhythm with poor R wave progression, borderline LVH with nonspecific ST changes. Pro BNP level 1301 with troponin I 0.71 and CK-MB 4.4. She did undergo a CT angiogram in the chest that showed no pulmonary embolus, cardiomegaly with evidence of dense coronary  calcifications, trace bilateral pleural effusions and groundglass opacities in the mid to lower lung zones   HOSPITAL COURSE:   #1 elevated cardiac markers non-ST elevation MI. Patient was treated with aspirin statin beta blocker as well as therapeutic dose of Lovenox 2-D echo was reassuring without any wall motion abnormalities with a normal ejection fraction Per cardiology the patient will followup with Dr. Dulce Sellar -- -[place] And no further workup is needed at this time   #2 left total knee replacement patient was continued on the treatment recommended by Dr. Charlann Boxer PT OT consultation will be obtained and the patient is requesting a bedside commode which will be arranged for  Discharge Exam:  Blood pressure 130/77, pulse 84, temperature 98.5 F (36.9 C), temperature source Oral, resp. rate 19, height 5\' 1"  (1.549 m), weight 80.6 kg (177 lb 11.1 oz), SpO2 92.00%.   General: Well developed, well nourished, in no acute distress. In bed.  Head: Normocephalic and atraumatic.  Lungs: Clear to auscultation and percussion.  Heart: Normal S1 and S2. No murmur, rubs or gallops.  Abdomen: soft, non-tender, positive bowel sounds.  Extremities: Trace edema. Post op.  Neurologic: Alert and oriented x 3.   Discharge Orders    Future Orders Please Complete By Expires   Diet - low sodium heart healthy      Increase activity slowly      Call MD for:  persistant nausea and vomiting      Call MD for:  temperature >100.4      Call MD for:  severe uncontrolled pain           Signed: Lakisha Peyser 02/10/2012, 8:55 AM

## 2012-02-10 NOTE — Progress Notes (Signed)
Subjective:  Feeling well. No CP, no SOB. No palps.   Objective:  Vital Signs in the last 24 hours: Temp:  [98.4 F (36.9 C)-98.6 F (37 C)] 98.5 F (36.9 C) (07/06 0626) Pulse Rate:  [84-96] 84  (07/06 0626) Resp:  [18-19] 19  (07/06 0626) BP: (130-136)/(77-84) 130/77 mmHg (07/06 0626) SpO2:  [92 %-97 %] 92 % (07/06 0626) Weight:  [80.6 kg (177 lb 11.1 oz)] 80.6 kg (177 lb 11.1 oz) (07/05 2207)  Intake/Output from previous day: 07/05 0701 - 07/06 0700 In: 680 [P.O.:680] Out: 3025 [Urine:3025]   Physical Exam: General: Well developed, well nourished, in no acute distress. In bed. Head:  Normocephalic and atraumatic. Lungs: Clear to auscultation and percussion. Heart: Normal S1 and S2.  No murmur, rubs or gallops.  Abdomen: soft, non-tender, positive bowel sounds. Extremities: Trace edema. Post op.  Neurologic: Alert and oriented x 3.    Lab Results:  Basename 02/10/12 0535 02/09/12 0420  WBC 9.0 9.5  HGB 9.5* 8.9*  PLT 359 287    Basename 02/10/12 0535 02/09/12 0420  NA 140 137  K 4.4 3.2*  CL 101 100  CO2 33* 28  GLUCOSE 185* 149*  BUN 16 17  CREATININE 0.82 0.82    Basename 02/09/12 0420 02/08/12 2017  TROPONINI 0.90* 1.32*   Hepatic Function Panel  Basename 02/09/12 0420  PROT 6.1  ALBUMIN 2.6*  AST 15  ALT 14  ALKPHOS 60  BILITOT 0.5  BILIDIR --  IBILI --   No results found for this basename: CHOL in the last 72 hours No results found for this basename: PROTIME in the last 72 hours  Telemetry: No adverse rhythm Personally viewed.   EKG:  PRWP, NSSTW changes on 7/4.   ECHO - normal EF.   Assessment/Plan:   Elevated cardiac markers  - trending down. Improved. EF reassuring. Likely demand ischemia in setting of hypoxia/ stress.   - No further cardiac recs.   - Continue with home meds (ASA, statin...)  - OK to dc NTG paste.  - Resume Imdur as taking at home.   - Beta blocker.   CAD  - Prior stent in 2006. Dr. Dulce Sellar in [place] is  cardiologist. She will be following up with him.   HL  - takes pravastatin at home.   L TKR  - per primary team  OK for DC from CV standpoint  Alva Broxson 02/10/2012, 8:20 AM

## 2012-02-10 NOTE — Progress Notes (Signed)
Patient denies chest pain, shortness of breath.  Eager to get out of bed today and would like to go home.

## 2012-02-12 ENCOUNTER — Encounter (HOSPITAL_COMMUNITY): Payer: Self-pay | Admitting: Orthopedic Surgery

## 2013-06-23 IMAGING — CT CT ANGIO CHEST
2 of 4 series · 9 of 30 positions shown · IV contrast (OMNIPAQUE)
Comparison: Chest x-ray earlier today.

CLINICAL DATA: Shortness of breath.

CT ANGIOGRAPHY CHEST
TECHNIQUE: Multidetector CT imaging of the chest using the
standard protocol during bolus administration of intravenous
contrast. Multiplanar reconstructed images including MIPs were
obtained and reviewed to evaluate the vascular anatomy.
Contrast: 100mL OMNIPAQUE IOHEXOL 350 MG/ML SOLN

[Series 602: <mpr thick range> · coronal · 0.63mm/px · 3 of 129 slices shown]
[im 52/129  mediastinal]
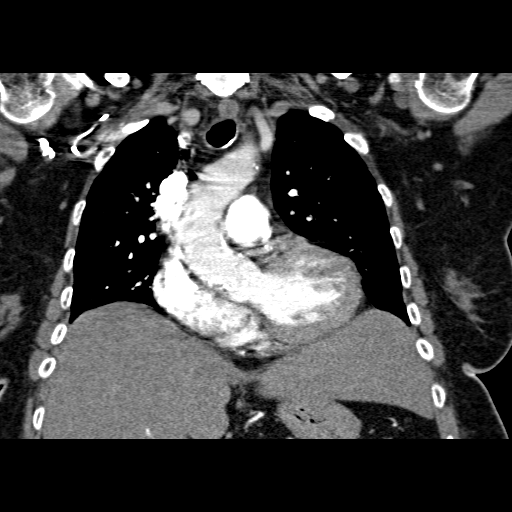
[im 65/129  mediastinal]
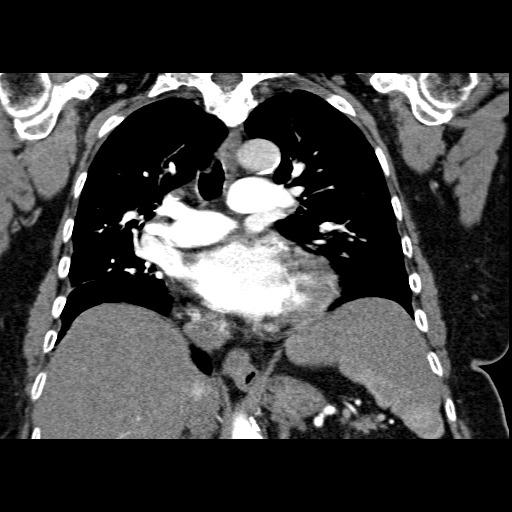
[im 77/129  mediastinal]
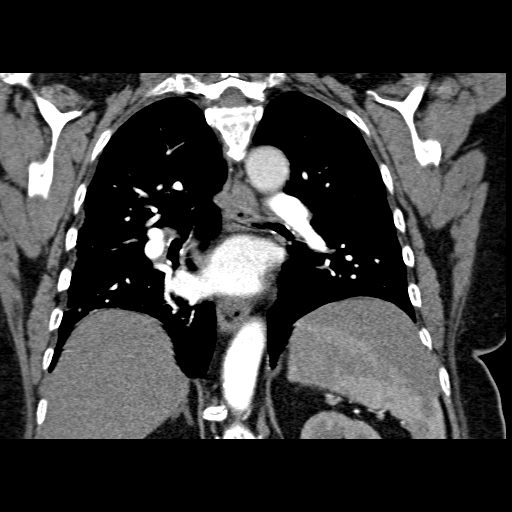

[Series 603: <mpr thick range(1)> · sagittal · 0.63mm/px · 6 of 169 slices shown]
[im 25/169  lung]
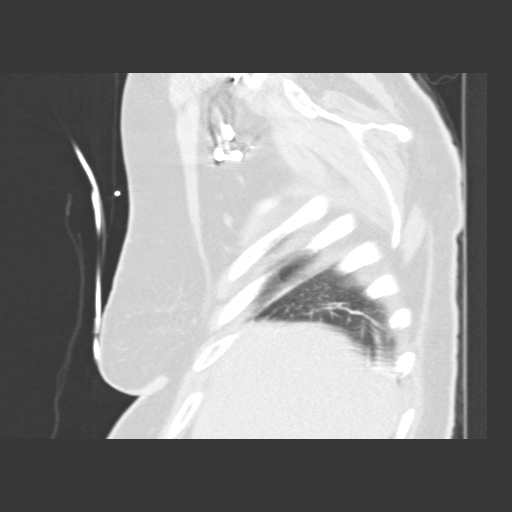
[im 49/169  mediastinal]
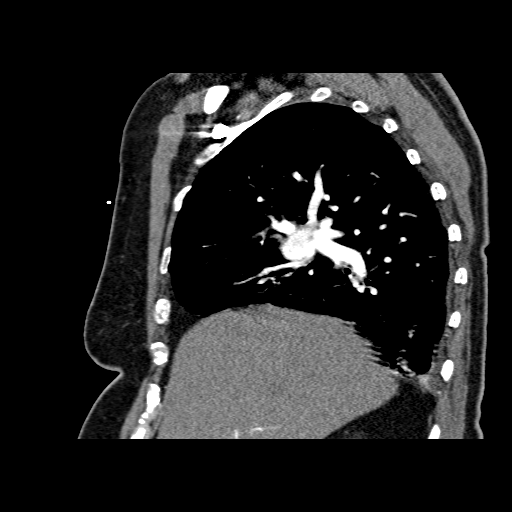
[im 73/169  lung]
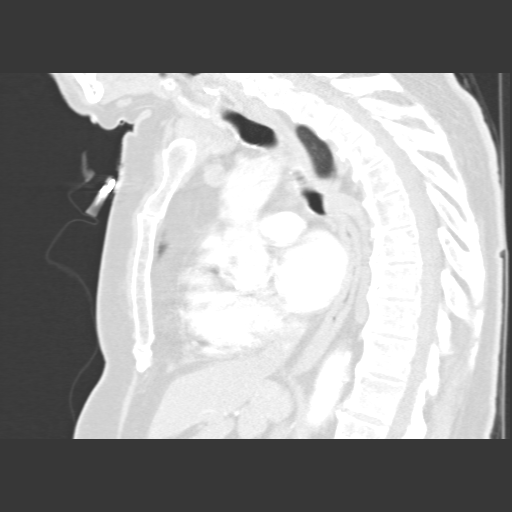
[im 97/169  mediastinal]
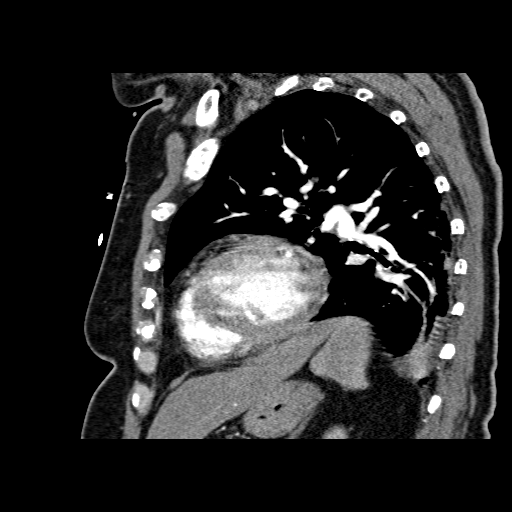
[im 121/169  lung]
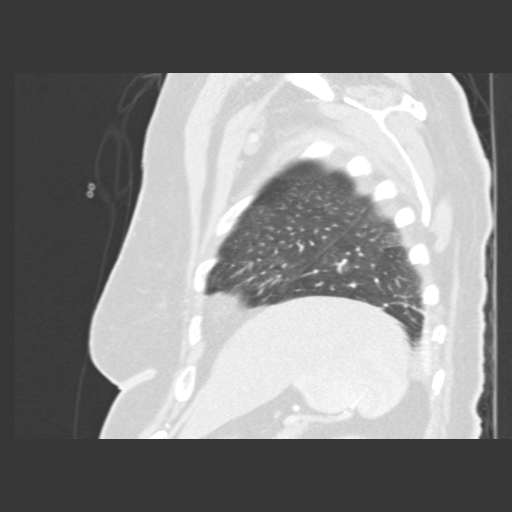
[im 145/169  mediastinal]
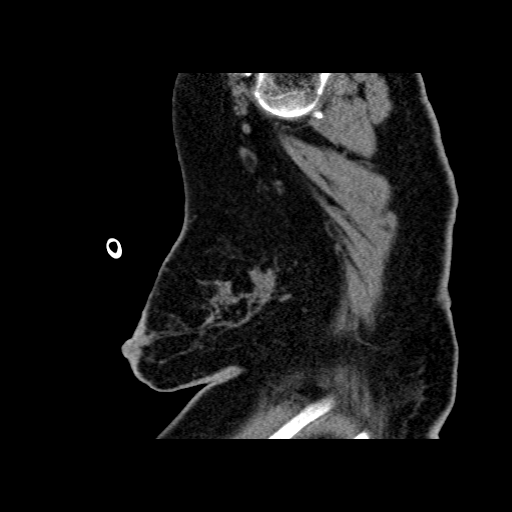

[9 of 30 positions shown; findings below may reference images not displayed]

FINDINGS: No filling defects in the pulmonary arteries to suggest
pulmonary emboli.  There is cardiomegaly.  Mild ground-glass
opacities in the mid and lower lungs may reflect early edema.
There is bibasilar atelectasis and trace bilateral effusions.
Dense coronary artery calcifications are present.  Aorta is normal
caliber. No mediastinal, hilar, or axillary adenopathy.  Visualized
thyroid and chest wall soft tissues unremarkable. Imaging into the
upper abdomen shows no acute findings.

No acute bony abnormality.  Degenerative changes in the thoracic
spine.
IMPRESSION: No evidence of pulmonary embolus.

Cardiomegaly, coronary artery disease.

Patchy ground-glass opacities in the mid and lower lungs may
reflect early interstitial edema. Bibasilar atelectasis and trace
bilateral effusions.

## 2013-11-08 ENCOUNTER — Encounter: Payer: Self-pay | Admitting: *Deleted

## 2015-09-04 ENCOUNTER — Inpatient Hospital Stay (HOSPITAL_COMMUNITY)
Admission: EM | Admit: 2015-09-04 | Discharge: 2015-09-06 | DRG: 872 | Disposition: A | Discharge status: Home / Self Care | Payer: Medicare Other | Attending: Internal Medicine | Admitting: Internal Medicine

## 2015-09-04 ENCOUNTER — Emergency Department (HOSPITAL_COMMUNITY): Payer: Medicare Other

## 2015-09-04 ENCOUNTER — Encounter (HOSPITAL_COMMUNITY): Payer: Self-pay

## 2015-09-04 DIAGNOSIS — G4733 Obstructive sleep apnea (adult) (pediatric): Secondary | ICD-10-CM | POA: Diagnosis present

## 2015-09-04 DIAGNOSIS — M1990 Unspecified osteoarthritis, unspecified site: Secondary | ICD-10-CM | POA: Diagnosis present

## 2015-09-04 DIAGNOSIS — Z9119 Patient's noncompliance with other medical treatment and regimen: Secondary | ICD-10-CM | POA: Diagnosis not present

## 2015-09-04 DIAGNOSIS — R6511 Systemic inflammatory response syndrome (SIRS) of non-infectious origin with acute organ dysfunction: Secondary | ICD-10-CM | POA: Diagnosis present

## 2015-09-04 DIAGNOSIS — E782 Mixed hyperlipidemia: Secondary | ICD-10-CM | POA: Diagnosis present

## 2015-09-04 DIAGNOSIS — I25119 Atherosclerotic heart disease of native coronary artery with unspecified angina pectoris: Secondary | ICD-10-CM | POA: Diagnosis present

## 2015-09-04 DIAGNOSIS — Z9842 Cataract extraction status, left eye: Secondary | ICD-10-CM

## 2015-09-04 DIAGNOSIS — Z9841 Cataract extraction status, right eye: Secondary | ICD-10-CM

## 2015-09-04 DIAGNOSIS — R251 Tremor, unspecified: Secondary | ICD-10-CM | POA: Diagnosis present

## 2015-09-04 DIAGNOSIS — Z7982 Long term (current) use of aspirin: Secondary | ICD-10-CM | POA: Diagnosis not present

## 2015-09-04 DIAGNOSIS — Z8249 Family history of ischemic heart disease and other diseases of the circulatory system: Secondary | ICD-10-CM

## 2015-09-04 DIAGNOSIS — Z7984 Long term (current) use of oral hypoglycemic drugs: Secondary | ICD-10-CM | POA: Diagnosis not present

## 2015-09-04 DIAGNOSIS — E860 Dehydration: Secondary | ICD-10-CM | POA: Diagnosis present

## 2015-09-04 DIAGNOSIS — Z8542 Personal history of malignant neoplasm of other parts of uterus: Secondary | ICD-10-CM | POA: Diagnosis not present

## 2015-09-04 DIAGNOSIS — R6883 Chills (without fever): Secondary | ICD-10-CM | POA: Diagnosis present

## 2015-09-04 DIAGNOSIS — E876 Hypokalemia: Secondary | ICD-10-CM | POA: Diagnosis present

## 2015-09-04 DIAGNOSIS — D72829 Elevated white blood cell count, unspecified: Secondary | ICD-10-CM | POA: Diagnosis present

## 2015-09-04 DIAGNOSIS — I252 Old myocardial infarction: Secondary | ICD-10-CM

## 2015-09-04 DIAGNOSIS — E1159 Type 2 diabetes mellitus with other circulatory complications: Secondary | ICD-10-CM | POA: Diagnosis present

## 2015-09-04 DIAGNOSIS — I10 Essential (primary) hypertension: Secondary | ICD-10-CM | POA: Diagnosis present

## 2015-09-04 DIAGNOSIS — Z79899 Other long term (current) drug therapy: Secondary | ICD-10-CM | POA: Diagnosis not present

## 2015-09-04 DIAGNOSIS — K219 Gastro-esophageal reflux disease without esophagitis: Secondary | ICD-10-CM | POA: Diagnosis present

## 2015-09-04 DIAGNOSIS — R6510 Systemic inflammatory response syndrome (SIRS) of non-infectious origin without acute organ dysfunction: Secondary | ICD-10-CM

## 2015-09-04 DIAGNOSIS — Z7952 Long term (current) use of systemic steroids: Secondary | ICD-10-CM

## 2015-09-04 DIAGNOSIS — N179 Acute kidney failure, unspecified: Secondary | ICD-10-CM | POA: Diagnosis present

## 2015-09-04 DIAGNOSIS — E872 Acidosis: Secondary | ICD-10-CM | POA: Diagnosis present

## 2015-09-04 DIAGNOSIS — I5032 Chronic diastolic (congestive) heart failure: Secondary | ICD-10-CM | POA: Diagnosis not present

## 2015-09-04 DIAGNOSIS — I2510 Atherosclerotic heart disease of native coronary artery without angina pectoris: Secondary | ICD-10-CM | POA: Diagnosis present

## 2015-09-04 DIAGNOSIS — M25552 Pain in left hip: Secondary | ICD-10-CM | POA: Diagnosis not present

## 2015-09-04 DIAGNOSIS — Z90710 Acquired absence of both cervix and uterus: Secondary | ICD-10-CM | POA: Diagnosis not present

## 2015-09-04 HISTORY — DX: Heart failure, unspecified: I50.9

## 2015-09-04 HISTORY — DX: Pain in left hip: M25.552

## 2015-09-04 HISTORY — DX: Type 2 diabetes mellitus with other circulatory complications: E11.59

## 2015-09-04 HISTORY — DX: Chronic diastolic (congestive) heart failure: I50.32

## 2015-09-04 HISTORY — DX: Acute kidney failure, unspecified: N17.9

## 2015-09-04 HISTORY — DX: Elevated white blood cell count, unspecified: D72.829

## 2015-09-04 HISTORY — DX: Systemic inflammatory response syndrome (sirs) of non-infectious origin without acute organ dysfunction: R65.10

## 2015-09-04 HISTORY — DX: Hypercalcemia: E83.52

## 2015-09-04 LAB — LACTIC ACID, PLASMA
Lactic Acid, Venous: 3.8 mmol/L (ref 0.5–2.0)
Lactic Acid, Venous: 2.7 mmol/L (ref 0.5–2.0)

## 2015-09-04 LAB — URINALYSIS, ROUTINE W REFLEX MICROSCOPIC
Glucose, UA: NEGATIVE mg/dL
HGB URINE DIPSTICK: NEGATIVE
KETONES UR: NEGATIVE mg/dL
Leukocytes, UA: NEGATIVE
NITRITE: NEGATIVE
PROTEIN: 30 mg/dL — AB
Specific Gravity, Urine: 1.019 (ref 1.005–1.030)
pH: 5 (ref 5.0–8.0)

## 2015-09-04 LAB — URINE MICROSCOPIC-ADD ON
RBC / HPF: NONE SEEN RBC/hpf (ref 0–5)
WBC, UA: NONE SEEN WBC/hpf (ref 0–5)

## 2015-09-04 LAB — CBC
HCT: 44.4 % (ref 36.0–46.0)
HEMOGLOBIN: 13.6 g/dL (ref 12.0–15.0)
MCH: 25.3 pg — ABNORMAL LOW (ref 26.0–34.0)
MCHC: 30.6 g/dL (ref 30.0–36.0)
MCV: 82.7 fL (ref 78.0–100.0)
PLATELETS: 394 10*3/uL (ref 150–400)
RBC: 5.37 MIL/uL — AB (ref 3.87–5.11)
RDW: 16.8 % — ABNORMAL HIGH (ref 11.5–15.5)
WBC: 19.9 10*3/uL — AB (ref 4.0–10.5)

## 2015-09-04 LAB — SEDIMENTATION RATE: Sed Rate: 20 mm/hr (ref 0–22)

## 2015-09-04 LAB — COMPREHENSIVE METABOLIC PANEL
ALT: 9 U/L — AB (ref 14–54)
AST: 23 U/L (ref 15–41)
Albumin: 4.5 g/dL (ref 3.5–5.0)
Alkaline Phosphatase: 76 U/L (ref 38–126)
Anion gap: 18 — ABNORMAL HIGH (ref 5–15)
BUN: 45 mg/dL — ABNORMAL HIGH (ref 6–20)
CHLORIDE: 102 mmol/L (ref 101–111)
CO2: 20 mmol/L — AB (ref 22–32)
CREATININE: 2.72 mg/dL — AB (ref 0.44–1.00)
Calcium: 10.4 mg/dL — ABNORMAL HIGH (ref 8.9–10.3)
GFR calc non Af Amer: 16 mL/min — ABNORMAL LOW (ref >60)
GFR, EST AFRICAN AMERICAN: 18 mL/min — AB (ref >60)
Glucose, Bld: 186 mg/dL — ABNORMAL HIGH (ref 65–99)
Potassium: 4.4 mmol/L (ref 3.5–5.1)
SODIUM: 140 mmol/L (ref 135–145)
Total Bilirubin: 0.5 mg/dL (ref 0.3–1.2)
Total Protein: 8.2 g/dL — ABNORMAL HIGH (ref 6.5–8.1)

## 2015-09-04 LAB — I-STAT CG4 LACTIC ACID, ED
LACTIC ACID, VENOUS: 3.33 mmol/L — AB (ref 0.5–2.0)
Lactic Acid, Venous: 5.52 mmol/L (ref 0.5–2.0)

## 2015-09-04 LAB — APTT: APTT: 27 s (ref 24–37)

## 2015-09-04 LAB — GLUCOSE, CAPILLARY
GLUCOSE-CAPILLARY: 206 mg/dL — AB (ref 65–99)
GLUCOSE-CAPILLARY: 176 mg/dL — AB (ref 65–99)
Glucose-Capillary: 189 mg/dL — ABNORMAL HIGH (ref 65–99)

## 2015-09-04 LAB — PROCALCITONIN

## 2015-09-04 LAB — PROTIME-INR
INR: 1.29 (ref 0.00–1.49)
PROTHROMBIN TIME: 15.7 s — AB (ref 11.6–15.2)

## 2015-09-04 LAB — CBG MONITORING, ED: GLUCOSE-CAPILLARY: 173 mg/dL — AB (ref 65–99)

## 2015-09-04 MED ORDER — ACETAMINOPHEN 325 MG PO TABS: 650.0000 mg | ORAL_TABLET | Freq: Four times a day (QID) | ORAL | Status: DC | PRN

## 2015-09-04 MED ORDER — SODIUM CHLORIDE 0.9 % IV SOLN
INTRAVENOUS | Status: DC
  Administered 2015-09-04 – 2015-09-06 (×5): via INTRAVENOUS

## 2015-09-04 MED ORDER — PIPERACILLIN-TAZOBACTAM IN DEX 2-0.25 GM/50ML IV SOLN
2.2500 g | Freq: Four times a day (QID) | INTRAVENOUS | Status: DC
  Administered 2015-09-04 – 2015-09-05 (×3): 2.25 g via INTRAVENOUS
  Filled 2015-09-04 (×4): qty 50

## 2015-09-04 MED ORDER — PIPERACILLIN-TAZOBACTAM 3.375 G IVPB 30 MIN
3.3750 g | Freq: Once | INTRAVENOUS | Status: DC
  Filled 2015-09-04: qty 50

## 2015-09-04 MED ORDER — ASPIRIN EC 81 MG PO TBEC
162.0000 mg | DELAYED_RELEASE_TABLET | Freq: Every day | ORAL | Status: DC
  Administered 2015-09-05 – 2015-09-06 (×2): 162 mg via ORAL
  Filled 2015-09-04 (×2): qty 2

## 2015-09-04 MED ORDER — ISOSORBIDE MONONITRATE ER 30 MG PO TB24
60.0000 mg | ORAL_TABLET | Freq: Every day | ORAL | Status: DC
  Administered 2015-09-04 – 2015-09-06 (×3): 60 mg via ORAL
  Filled 2015-09-04 (×3): qty 2

## 2015-09-04 MED ORDER — PANTOPRAZOLE SODIUM 40 MG PO TBEC
40.0000 mg | DELAYED_RELEASE_TABLET | Freq: Every day | ORAL | Status: DC
  Administered 2015-09-04 – 2015-09-06 (×3): 40 mg via ORAL
  Filled 2015-09-04 (×3): qty 1

## 2015-09-04 MED ORDER — ADULT MULTIVITAMIN W/MINERALS CH
1.0000 | ORAL_TABLET | Freq: Every day | ORAL | Status: DC
  Administered 2015-09-04 – 2015-09-06 (×3): 1 via ORAL
  Filled 2015-09-04 (×3): qty 1

## 2015-09-04 MED ORDER — AMLODIPINE BESYLATE 10 MG PO TABS
10.0000 mg | ORAL_TABLET | Freq: Every morning | ORAL | Status: DC
  Administered 2015-09-04 – 2015-09-06 (×3): 10 mg via ORAL
  Filled 2015-09-04 (×3): qty 1

## 2015-09-04 MED ORDER — ENOXAPARIN SODIUM 30 MG/0.3ML ~~LOC~~ SOLN
30.0000 mg | Freq: Every day | SUBCUTANEOUS | Status: DC
  Administered 2015-09-04: 30 mg via SUBCUTANEOUS
  Filled 2015-09-04: qty 0.3

## 2015-09-04 MED ORDER — FERROUS SULFATE 325 (65 FE) MG PO TABS
325.0000 mg | ORAL_TABLET | Freq: Three times a day (TID) | ORAL | Status: DC
  Administered 2015-09-04 – 2015-09-06 (×6): 325 mg via ORAL
  Filled 2015-09-04 (×6): qty 1

## 2015-09-04 MED ORDER — SODIUM CHLORIDE 0.9 % IV SOLN
1000.0000 mL | INTRAVENOUS | Status: DC
  Administered 2015-09-04: 1000 mL via INTRAVENOUS

## 2015-09-04 MED ORDER — ALUM & MAG HYDROXIDE-SIMETH 200-200-20 MG/5ML PO SUSP: 30.0000 mL | Freq: Four times a day (QID) | ORAL | Status: DC | PRN

## 2015-09-04 MED ORDER — VANCOMYCIN HCL IN DEXTROSE 1-5 GM/200ML-% IV SOLN
1000.0000 mg | INTRAVENOUS | Status: DC
  Administered 2015-09-04: 1000 mg via INTRAVENOUS
  Filled 2015-09-04: qty 200

## 2015-09-04 MED ORDER — TRAMADOL HCL 50 MG PO TABS: 50.0000 mg | ORAL_TABLET | ORAL | Status: DC | PRN

## 2015-09-04 MED ORDER — ACETAMINOPHEN 650 MG RE SUPP: 650.0000 mg | Freq: Four times a day (QID) | RECTAL | Status: DC | PRN

## 2015-09-04 MED ORDER — PRAVASTATIN SODIUM 20 MG PO TABS
40.0000 mg | ORAL_TABLET | Freq: Every day | ORAL | Status: DC
  Administered 2015-09-04 – 2015-09-05 (×2): 40 mg via ORAL
  Filled 2015-09-04 (×2): qty 2

## 2015-09-04 MED ORDER — POLYETHYLENE GLYCOL 3350 17 G PO PACK: 17.0000 g | PACK | Freq: Every day | ORAL | Status: DC | PRN

## 2015-09-04 MED ORDER — ONDANSETRON HCL 4 MG PO TABS: 4.0000 mg | ORAL_TABLET | Freq: Four times a day (QID) | ORAL | Status: DC | PRN

## 2015-09-04 MED ORDER — VANCOMYCIN HCL IN DEXTROSE 1-5 GM/200ML-% IV SOLN: 1000.0000 mg | Freq: Once | INTRAVENOUS | Status: DC

## 2015-09-04 MED ORDER — SODIUM CHLORIDE 0.9 % IV BOLUS (SEPSIS)
1500.0000 mL | Freq: Once | INTRAVENOUS | Status: AC
  Administered 2015-09-04: 1500 mL via INTRAVENOUS

## 2015-09-04 MED ORDER — ENOXAPARIN SODIUM 40 MG/0.4ML ~~LOC~~ SOLN: 40.0000 mg | SUBCUTANEOUS | Status: DC

## 2015-09-04 MED ORDER — SODIUM CHLORIDE 0.9 % IV BOLUS (SEPSIS)
1000.0000 mL | Freq: Once | INTRAVENOUS | Status: AC
  Administered 2015-09-04: 1000 mL via INTRAVENOUS

## 2015-09-04 MED ORDER — PIPERACILLIN-TAZOBACTAM 3.375 G IVPB
3.3750 g | Freq: Once | INTRAVENOUS | Status: AC
  Administered 2015-09-04: 3.375 g via INTRAVENOUS
  Filled 2015-09-04: qty 50

## 2015-09-04 MED ORDER — METOPROLOL TARTRATE 50 MG PO TABS
50.0000 mg | ORAL_TABLET | Freq: Two times a day (BID) | ORAL | Status: DC
  Administered 2015-09-04 – 2015-09-06 (×5): 50 mg via ORAL
  Filled 2015-09-04 (×5): qty 1

## 2015-09-04 MED ORDER — VANCOMYCIN HCL IN DEXTROSE 1-5 GM/200ML-% IV SOLN
1000.0000 mg | Freq: Once | INTRAVENOUS | Status: AC
  Administered 2015-09-04: 1000 mg via INTRAVENOUS
  Filled 2015-09-04: qty 200

## 2015-09-04 MED ORDER — ONDANSETRON HCL 4 MG/2ML IJ SOLN: 4.0000 mg | Freq: Four times a day (QID) | INTRAMUSCULAR | Status: DC | PRN

## 2015-09-04 MED ORDER — GLIMEPIRIDE 4 MG PO TABS
4.0000 mg | ORAL_TABLET | Freq: Every day | ORAL | Status: DC
  Administered 2015-09-05 – 2015-09-06 (×2): 4 mg via ORAL
  Filled 2015-09-04 (×3): qty 1

## 2015-09-04 MED ORDER — ASPIRIN EC 325 MG PO TBEC
325.0000 mg | DELAYED_RELEASE_TABLET | Freq: Two times a day (BID) | ORAL | Status: DC
  Administered 2015-09-04: 325 mg via ORAL
  Filled 2015-09-04: qty 1

## 2015-09-04 NOTE — ED Provider Notes (Signed)
CSN: [id number]     Arrival date & time 09/04/15  0531 History   First MD Initiated Contact with Patient 09/04/15 0630     Chief Complaint  Patient presents with  . Tremors  . Altered Mental Status   (Consider location/radiation/quality/duration/timing/severity/associated sxs/prior Treatment) HPI  75 y.o. female with a hx of DM, HTN, HLD, CAD, presents to the Emergency Department today complaining of feeling shaky this morning around 03:40. According to the husband, the patient was shaking her whole body in bed. No fever. No diaphoresis. Pt notes that she saw her PCP on Wednesday due to back pain and N/V since Monday. According PCP believed it to be lower back pain causing the N/V and gave her prednisone. No shaking currently. No N/V/D currently. No CP/SOB/ABD pain. No other symptoms noted.      Past Medical History  Diagnosis Date  . Diabetes mellitus   . Hypertension   . Coronary artery disease     Followed by Dr.Munley  . GERD (gastroesophageal reflux disease)   . Hyperlipidemia   . Sleep apnea     PT HAS CPAP MASK AND MACHINE AT HOME--BUT DOES NOT USE-COULD NOT TOLERATE  . Cancer (HCC)     UTERINE CANCER - S/P HYSTERECTOMY  . Myocardial infarction ([place]) JULY 2006    [place]  . Arthritis     OA AND PAIN LEFT KNEE  AND ARTHRITIS IN FINGERS   Past Surgical History  Procedure Laterality Date  . Left knee arthroscopy  2012  . Abdominal hysterectomy  2000    UTERINE CANCER  . Bilateral cataract extractions  2005  . Coronary angioplasty    . Total knee arthroplasty  02/05/2012    Procedure: TOTAL KNEE ARTHROPLASTY;  Surgeon: Mauri Pole, MD;  Location: [facility];  Service: Orthopedics;  Laterality: Left;   Family History  Problem Relation Age of Onset  . Hypertension    . Atrial fibrillation     Social History  Substance Use Topics  . Smoking status: Never Smoker   . Smokeless tobacco: Never Used  . Alcohol Use: No   OB History    No data  available     Review of Systems ROS reviewed and all are negative for acute change except as noted in the HPI.  Allergies  Review of patient's allergies indicates no known allergies.  Home Medications   Prior to Admission medications   Medication Sig Start Date End Date Taking? Authorizing Provider  amLODipine (NORVASC) 10 MG tablet Take 10 mg by mouth every morning.     Historical Provider, MD  aspirin EC 325 MG tablet Take 325 mg by mouth 2 (two) times daily. She is to take for 4 weeks starting on 02/07/12.    Historical Provider, MD  Calcium-Vitamin D (CALTRATE 600 PLUS-VIT D PO) Take 1 tablet by mouth 2 (two) times daily.    Historical Provider, MD  ferrous sulfate 325 (65 FE) MG tablet Take 325 mg by mouth 3 (three) times daily after meals. 02/07/12 02/06/13  Danae Orleans, PA-C  glimepiride (AMARYL) 4 MG tablet Take 4 mg by mouth 2 (two) times daily.    Historical Provider, MD  Glucosamine-Chondroitin (GLUCOSAMINE CHONDR COMPLEX PO) Take 1 tablet by mouth 2 (two) times daily. [place] Provider, MD  isosorbide mononitrate (IMDUR) 60 MG 24 hr tablet Take 60 mg by mouth daily.    Historical Provider, MD  losartan-hydrochlorothiazide (HYZAAR) 100-25 MG  per tablet Take 1 tablet by mouth daily.    Historical Provider, MD  metFORMIN (GLUCOPHAGE) 1000 MG tablet Take 1,000 mg by mouth 2 (two) times daily with a meal.    Historical Provider, MD  metoprolol (LOPRESSOR) 50 MG tablet Take 50 mg by mouth 2 (two) times daily.    Historical Provider, MD  pantoprazole (PROTONIX) 40 MG tablet Take 40 mg by mouth daily.    Historical Provider, MD  pravastatin (PRAVACHOL) 40 MG tablet Take 40 mg by mouth at bedtime.    Historical Provider, MD   BP 144/98 mmHg  Pulse 75  Temp(Src) 97.4 F (36.3 C) (Oral)  Resp 18  SpO2 94%   Physical Exam  Constitutional: She is oriented to person, place, and time. Vital signs are normal. She appears well-developed and  well-nourished.  Non-toxic appearance. She does not have a sickly appearance. She does not appear ill. No distress.  HENT:  Head: Normocephalic and atraumatic.  Nose: Nose normal.  Mouth/Throat: Uvula is midline, oropharynx is clear and moist and mucous membranes are normal.  Eyes: Conjunctivae and EOM are normal. Pupils are equal, round, and reactive to light.  Neck: Trachea normal and normal range of motion. Neck supple.  Cardiovascular: Normal rate, regular rhythm, S1 normal, S2 normal, normal heart sounds and normal pulses.   Pulmonary/Chest: Effort normal and breath sounds normal. No respiratory distress. She has no wheezes. She has no rhonchi. She has no rales.  Abdominal: Soft. Normal appearance and bowel sounds are normal. There is no tenderness. There is CVA tenderness. There is no rebound, no tenderness at McBurney's point and negative Murphy's sign.  L sided flank tenderness   Musculoskeletal: Normal range of motion.  Neurological: She is alert and oriented to person, place, and time. She has normal strength. No cranial nerve deficit or sensory deficit.  Skin: Skin is warm and dry.  Psychiatric: She has a normal mood and affect. Her speech is normal and behavior is normal. Thought content normal.  Nursing note and vitals reviewed.  ED Course  Procedures (including critical care time) Labs Review Labs Reviewed  COMPREHENSIVE METABOLIC PANEL - Abnormal; Notable for the following:    CO2 20 (*)    Glucose, Bld 186 (*)    BUN 45 (*)    Creatinine, Ser 2.72 (*)    Calcium 10.4 (*)    Total Protein 8.2 (*)    ALT 9 (*)    GFR calc non Af Amer 16 (*)    GFR calc Af Amer 18 (*)    Anion gap 18 (*)    All other components within normal limits  CBC - Abnormal; Notable for the following:    WBC 19.9 (*)    RBC 5.37 (*)    MCH 25.3 (*)    RDW 16.8 (*)    All other components within normal limits  URINALYSIS, ROUTINE W REFLEX MICROSCOPIC (NOT AT [facility]) - Abnormal; Notable for the  following:    APPearance CLOUDY (*)    Bilirubin Urine SMALL (*)    Protein, ur 30 (*)    All other components within normal limits  URINE MICROSCOPIC-ADD ON - Abnormal; Notable for the following:    Squamous Epithelial / LPF 0-5 (*)    Bacteria, UA RARE (*)    Casts HYALINE CASTS (*)    All other components within normal limits  CBG MONITORING, ED - Abnormal; Notable for the following:    Glucose-Capillary 173 (*)    All  other components within normal limits  I-STAT CG4 LACTIC ACID, ED - Abnormal; Notable for the following:    Lactic Acid, Venous 5.52 (*)    All other components within normal limits  I-STAT CG4 LACTIC ACID, ED - Abnormal; Notable for the following:    Lactic Acid, Venous 3.33 (*)    All other components within normal limits  CULTURE, BLOOD (ROUTINE X 2)  CULTURE, BLOOD (ROUTINE X 2)  URINE CULTURE  SEDIMENTATION RATE   Imaging Review Dg Chest 1 View  09/04/2015  CLINICAL DATA:  Possible sepsis EXAM: CHEST 1 VIEW COMPARISON:  10/19/2014 FINDINGS: Cardiac shadow is mildly enlarged. The lungs are well aerated bilaterally. No focal bony abnormality is seen. IMPRESSION: No acute abnormality noted. Electronically Signed   By: Inez Catalina M.D.   On: 09/04/2015 07:38   Ct Renal Stone Study  09/04/2015  CLINICAL DATA:  Flank pain EXAM: CT ABDOMEN AND PELVIS WITHOUT CONTRAST TECHNIQUE: Multidetector CT imaging of the abdomen and pelvis was performed following the standard protocol without IV contrast. COMPARISON:  02/08/2012 FINDINGS: Lung bases are free of acute infiltrate or sizable effusion. A tiny subpleural nodule is noted in the right lower lobe best seen on image number 10 of series 4. This is stable from the prior exam and was shown previously to represent a calcified granuloma. The liver, spleen, gallbladder and pancreas are all normal in their CT appearance. The right adrenal gland is within normal limits. The left adrenal gland demonstrates a focal 1 cm hypodensity  best seen on image number 24 series 2 likely representing a small adenoma. The appendix is within normal limits. The bladder is well distended. No pelvic mass lesion or sidewall abnormality is noted. The uterus has been surgically removed. Degenerative changes of the lumbar spine are noted. IMPRESSION: Stable granuloma in the right lung base. Left adrenal lesion likely representing an adenoma. No renal calculi or obstructive changes are noted. Electronically Signed   By: Inez Catalina M.D.   On: 09/04/2015 08:41   I have personally reviewed and evaluated these images and lab results as part of my medical decision-making.   EKG Interpretation   Date/Time:  Saturday September 04 2015 06:14:17 EST Ventricular Rate:  76 PR Interval:  151 QRS Duration: 102 QT Interval:  394 QTC Calculation: 443 R Axis:   1 Text Interpretation:  Sinus rhythm Atrial premature complexes Nonspecific  T abnormalities, lateral leads Confirmed by HORTON  MD, COURTNEY ([id number])  on 09/04/2015 6:20:51 AM      MDM   I have reviewed relevant laboratory values. I have reviewed relevant imaging studies. I personally interpreted the relevant EKG. I have reviewed the relevant previous healthcare records.  I obtained HPI from historian. Patient discussed with supervising physician  ED Course:  Assessment: 70y F with a hx of DM, HTN, HLD, CAD, presents with shaking this morning. Was Rx Prednisone on Wednesday by PCP for c/o of low back pain. On exam, pt has tenderness on L flank. Exam otherwise unremarkable. No AMS. A+Ox3. Initial istat lactate showed elevated Lactic Acid (5.5). Afebrile. Not Tachycardic, No Hypotension. Given 1L NS bolus. CBC/BMP returned with AKI (Cr- 2.72) and Leukocytosis (19.9). UA unremarkable. Ordered CT Renal and started empirically on ABX (Vanc/Zosyn). Additional 1.5L bolus given. CT unremarkable. Repeat Lactate 3.3. Will admit to medicine (Dr. Rockne Menghini) for further work up due to AKI. Lactate improving with  hydration          Disposition/Plan:  Admit Pt acknowledges and agrees  with plan   Supervising Physician Merryl Hacker, MD   Final diagnoses:  AKI (acute kidney injury) [facility])       Shary Decamp, PA-C 09/04/15 [place], MD 09/04/15 [phone]

## 2015-09-04 NOTE — Progress Notes (Signed)
ANTIBIOTIC CONSULT NOTE - INITIAL  Pharmacy Consult for vancomycin/zosyn Indication: sepsis  No Known Allergies  Patient Measurements: Height: 5\' 7"  (170.2 cm) Weight: 177 lb (80.287 kg) IBW/kg (Calculated) : 61.6 Adjusted Body Weight:   Vital Signs: Temp: 98.3 F (36.8 C) (01/28 1030) Temp Source: Oral (01/28 1030) BP: 153/59 mmHg (01/28 1030) Pulse Rate: 81 (01/28 1030) Intake/Output from previous day: 01/27 0701 - 01/28 0700 In: -  Out: 30 [Urine:30] Intake/Output from this shift:    Labs:  Recent Labs  09/04/15 0630  WBC 19.9*  HGB 13.6  PLT 394  CREATININE 2.72*   Estimated Creatinine Clearance: 19.2 mL/min (by C-G formula based on Cr of 2.72). No results for input(s): VANCOTROUGH, VANCOPEAK, VANCORANDOM, GENTTROUGH, GENTPEAK, GENTRANDOM, TOBRATROUGH, TOBRAPEAK, TOBRARND, AMIKACINPEAK, AMIKACINTROU, AMIKACIN in the last 72 hours.   Microbiology: No results found for this or any previous visit (from the past 720 hour(s)).  Medical History: Past Medical History  Diagnosis Date  . Diabetes mellitus   . Hypertension   . Coronary artery disease     Followed by Dr.Munley  . GERD (gastroesophageal reflux disease)   . Hyperlipidemia   . Sleep apnea     PT HAS CPAP MASK AND MACHINE AT HOME--BUT DOES NOT USE-COULD NOT TOLERATE  . Cancer (HCC)     UTERINE CANCER - S/P HYSTERECTOMY  . Myocardial infarction ([place]) JULY 2006    [place]  . Arthritis     OA AND PAIN LEFT KNEE  AND ARTHRITIS IN FINGERS  . CHF exacerbation ([place]) 02/08/2012   Assessment: 82 YOF presents with tremors and altered mental status. Seen in PCP office this week for N/V, thought to be related to back pain and started on prednisone. AKI, elevated lactic acid, and leukocytosis noted on admission. No fever and VSS. No obvious source of infection  Antimicrobials this admission: 1/28 >>vancomycin   >> 1/28 >> pip/tazo   >>  Levels/dose changes this  admission:   Microbiology results: 1/28 Blood culture:  1/28 Urine culture:  / Sputum culture: / MRSA PCR:   Goal of Therapy:  Vancomycin trough level 15-20 mcg/ml  Plan:   Vancomycin 1gm x 1 given in ED, given another 1gm dose today to achieve peak ~33 then dose 1gm IV q48  Zosyn 2.25gm IV q6h   Change to q8h if oliguric  Watch renal function closely  Await work-up for infection and possible antibiotic adjustments  Doreene Eland, PharmD, BCPS.   Pager: [phone] 09/04/2015 12:02 PM

## 2015-09-04 NOTE — ED Provider Notes (Signed)
Pt here for tremors, has elevated lactate, CBC with leukocytosis, CMP with acute renal failure.  Treated empirically for sepsis with IVF and abx pending work up.  CT stone study negative for obstructive stone.  Plan to admit to medicine for further work up. Pt updated of findings of studies and recommendation for admission and pt is in agreement with plan. Discussed with Hospitalist, Dr. Rockne Menghini regarding admission for further work up/treatment.  Lactate is improving with IVF hydration, no hypotension in ED.  Pt well appearing on exam.    Quintella Reichert, MD 09/04/15 [phone]

## 2015-09-04 NOTE — ED Notes (Signed)
Pt c/o awaking this AM feeling "shaky". Pt reports was Rx'd Prednisone this past Wednesday for lower back recently and believes her current "shakening" is r/t to her Rx. Pts family states pt is more repetive verbally than usually and are concerned about this observation.

## 2015-09-04 NOTE — ED Notes (Signed)
MD Ralene Bathe made aware of istat lactic at [phone]

## 2015-09-04 NOTE — ED Notes (Signed)
1x attempt drawing blood for cultures, unsuccessful. RN starting [place] IV.

## 2015-09-04 NOTE — ED Notes (Signed)
Patient transported to CT 

## 2015-09-04 NOTE — H&P (Addendum)
History and Physical:    Lindsey Frost   MNO:[id number] DOB: 10/08/1940 DOA: 09/04/2015  Referring MD/provider: Dr. Ralene Bathe PCP: Laverna Peace, NP   Chief Complaint: Shaking chills  History of Present Illness:   Lindsey Louise Lebow is an 75 y.o. female diabetes, hypertension, CAD, OSA noncompliant with C Pap who presents with the sudden onset of shaking chills this morning.  There was no associated fever or diaphoresis at the time. Saw PCP 3 days ago for evaluation of left hip pain, diagnosed with "muscle spasms" and was treated with Tramadol, prednisone and baclofen, which helped the hip pain. Had some N/V prior to having the hip pain, but this has resolved. Family confided to the ED physician that she has been more repetitive verbally within normal. Of concern, an initial lactate was 5.5 however the patient did not have any frank source of infection. Chest x-ray was clear. Urinalysis was negative. CT with renal protocol did not show any evidence of nephrolithiasis or pyelonephritis. WBC elevated to 19.9.  ROS:   Review of Systems  Constitutional: Positive for chills and diaphoresis. Negative for fever and malaise/fatigue.  HENT: Negative for congestion and sore throat.   Eyes: Negative.   Respiratory: Negative for cough and shortness of breath.   Cardiovascular: Negative for chest pain and palpitations.  Gastrointestinal: Positive for nausea and vomiting. Negative for heartburn, abdominal pain, diarrhea, blood in stool and melena.  Genitourinary: Positive for flank pain. Negative for dysuria.       Left flank versus left hip/back pain  Musculoskeletal: Positive for back pain. Negative for myalgias.  Skin: Negative.   Neurological: Positive for weakness. Negative for dizziness and headaches.     Past Medical History:   Past Medical History  Diagnosis Date  . Diabetes mellitus   . Hypertension   . Coronary artery disease     Followed by Dr.Munley  . GERD (gastroesophageal  reflux disease)   . Hyperlipidemia   . Sleep apnea     PT HAS CPAP MASK AND MACHINE AT HOME--BUT DOES NOT USE-COULD NOT TOLERATE  . Cancer (HCC)     UTERINE CANCER - S/P HYSTERECTOMY  . Myocardial infarction ([place]) JULY 2006    [place]  . Arthritis     OA AND PAIN LEFT KNEE  AND ARTHRITIS IN FINGERS  . CHF exacerbation ([place]) 02/08/2012    Past Surgical History:   Past Surgical History  Procedure Laterality Date  . Left knee arthroscopy  2012  . Abdominal hysterectomy  2000    UTERINE CANCER  . Bilateral cataract extractions  2005  . Coronary angioplasty    . Total knee arthroplasty  02/05/2012    Procedure: TOTAL KNEE ARTHROPLASTY;  Surgeon: Mauri Pole, MD;  Location: [facility];  Service: Orthopedics;  Laterality: Left;    Social History:   Social History   Social History  . Marital Status: Married    Spouse Name: N/A  . Number of Children: N/A  . Years of Education: N/A   Occupational History  . Not on file.   Social History Main Topics  . Smoking status: Never Smoker   . Smokeless tobacco: Never Used  . Alcohol Use: No  . Drug Use: No  . Sexual Activity: Not Currently   Other Topics Concern  . Not on file   Social History Narrative    Family history:   Family History  Problem Relation Age of Onset  . Hypertension    .  Atrial fibrillation      Allergies   Review of patient's allergies indicates no known allergies.  Current Medications:   Prior to Admission medications   Medication Sig Start Date End Date Taking? Authorizing Provider  amLODipine (NORVASC) 10 MG tablet Take 10 mg by mouth every morning.    Yes Historical Provider, MD  aspirin EC 81 MG tablet Take 162 mg by mouth every morning.   Yes Historical Provider, MD  baclofen (LIORESAL) 10 MG tablet take 1 tablet by mouth every 4 to 6 hours if needed 09/02/15  Yes Historical Provider, MD  Calcium-Vitamin D (CALTRATE 600 PLUS-VIT D PO) Take 1 tablet by mouth every  morning.    Yes Historical Provider, MD  glimepiride (AMARYL) 4 MG tablet Take 4 mg by mouth daily with breakfast.    Yes Historical Provider, MD  Glucosamine-Chondroitin (GLUCOSAMINE CHONDR COMPLEX PO) Take 1 tablet by mouth 2 (two) times daily. [place] Provider, MD  isosorbide mononitrate (IMDUR) 60 MG 24 hr tablet Take 60 mg by mouth daily.   Yes Historical Provider, MD  losartan-hydrochlorothiazide (HYZAAR) 100-25 MG per tablet Take 1 tablet by mouth daily.   Yes Historical Provider, MD  metFORMIN (GLUCOPHAGE) 1000 MG tablet Take 1,000 mg by mouth 2 (two) times daily with a meal.   Yes Historical Provider, MD  metoprolol (LOPRESSOR) 50 MG tablet Take 50 mg by mouth 2 (two) times daily.   Yes Historical Provider, MD  Multiple Vitamin (MULTIVITAMIN WITH MINERALS) TABS tablet Take 1 tablet by mouth daily.   Yes Historical Provider, MD  pantoprazole (PROTONIX) 40 MG tablet Take 40 mg by mouth daily.   Yes Historical Provider, MD  pravastatin (PRAVACHOL) 40 MG tablet Take 40 mg by mouth at bedtime.   Yes Historical Provider, MD  predniSONE (DELTASONE) 20 MG tablet Take 20 mg by mouth 2 (two) times daily. Started 09-02-15  For 5 day course 09/02/15  Yes Historical Provider, MD  traMADol (ULTRAM) 50 MG tablet Take 50-100 mg by mouth every 4 (four) hours as needed for moderate pain.   Yes Historical Provider, MD  triamcinolone cream (KENALOG) 0.1 % apply to affected area ON FACE twice a day for 5 to 7 days 08/06/15  Yes Historical Provider, MD  aspirin EC 325 MG tablet Take 325 mg by mouth 2 (two) times daily. Reported on 09/04/2015    Historical Provider, MD  PNEUMOVAX 23 25 MCG/0.5ML injection inject 0.5 milliliter intramuscularly 05/28/15   Historical Provider, MD    Physical Exam:   Filed Vitals:   09/04/15 0915 09/04/15 0930 09/04/15 1000 09/04/15 1030  BP: 140/118 115/97 128/70 153/59  Pulse: 89 85 76 81  Temp:    98.3 F (36.8 C)  TempSrc:     Oral  Resp: _0 Height:      Weight:      SpO2: 94% 93% 95% 94%     Physical Exam: Blood pressure 153/59, pulse 81, temperature 98.3 F (36.8 C), temperature source Oral, resp. rate 16, height _1  (1.702 m), weight 80.287 kg (177 lb), SpO2 94 %. Gen: No acute distress. Head: Normocephalic, atraumatic. Face flushed. Eyes: PERRL, EOMI, sclerae nonicteric. Mouth: Oropharynx clear. Neck: Supple, no thyromegaly, no lymphadenopathy, no jugular venous distention. Chest: Lungs clear to auscultation bilaterally. CV: Heart sounds are regular, II/VI SEM Abdomen: Soft, nontender, nondistended with normal active bowel sounds. Extremities: Extremities without clubbing, edema, or cyanosis. Skin: Warm and dry. Neuro:  Alert and oriented times 3; nonfocal. Psych: Mood and affect normal.   Data Review:    Labs: Basic Metabolic Panel:  Recent Labs Lab 09/04/15 0630  NA 140  K 4.4  CL 102  CO2 20*  GLUCOSE 186*  BUN 45*  CREATININE 2.72*  CALCIUM 10.4*   Liver Function Tests:  Recent Labs Lab 09/04/15 0630  AST 23  ALT 9*  ALKPHOS 76  BILITOT 0.5  PROT 8.2*  ALBUMIN 4.5   CBC:  Recent Labs Lab 09/04/15 0630  WBC 19.9*  HGB 13.6  HCT 44.4  MCV 82.7  PLT 394   CBG:  Recent Labs Lab 09/04/15 0639  GLUCAP 173*    Radiographic Studies: Dg Chest 1 View  09/04/2015  CLINICAL DATA:  Possible sepsis EXAM: CHEST 1 VIEW COMPARISON:  10/19/2014 FINDINGS: Cardiac shadow is mildly enlarged. The lungs are well aerated bilaterally. No focal bony abnormality is seen. IMPRESSION: No acute abnormality noted. Electronically Signed   By: Inez Catalina M.D.   On: 09/04/2015 07:38   Ct Renal Stone Study  09/04/2015  CLINICAL DATA:  Flank pain EXAM: CT ABDOMEN AND PELVIS WITHOUT CONTRAST TECHNIQUE: Multidetector CT imaging of the abdomen and pelvis was performed following the standard protocol without IV contrast. COMPARISON:  02/08/2012 FINDINGS: Lung bases are free of  acute infiltrate or sizable effusion. A tiny subpleural nodule is noted in the right lower lobe best seen on image number 10 of series 4. This is stable from the prior exam and was shown previously to represent a calcified granuloma. The liver, spleen, gallbladder and pancreas are all normal in their CT appearance. The right adrenal gland is within normal limits. The left adrenal gland demonstrates a focal 1 cm hypodensity best seen on image number 24 series 2 likely representing a small adenoma. The appendix is within normal limits. The bladder is well distended. No pelvic mass lesion or sidewall abnormality is noted. The uterus has been surgically removed. Degenerative changes of the lumbar spine are noted. IMPRESSION: Stable granuloma in the right lung base. Left adrenal lesion likely representing an adenoma. No renal calculi or obstructive changes are noted. Electronically Signed   By: Inez Catalina M.D.   On: 09/04/2015 08:41   *I have personally reviewed the images above*  EKG: Independently reviewed. Normal sinus rhythm at 76 bpm, nonspecific T-wave changes in lateral leads. PACs.   Assessment/Plan:   Principal Problem:   SIRS (systemic inflammatory response syndrome) ([place]), rule out sepsis - Sepsis order set utilized. - No obvious source of infection, and ESR not elevated so doubt rheumatologic in etiology. - Afebrile despite leukocytosis and shaking chills. - Code sepsis ordered on admission, status post fluid volume resuscitation with weight-based algorithm. - Status post treatment with Zosyn and vancomycin 1. - Check pro calcitonin and continue empiric Zosyn and vancomycin for now. - Check PT, PTT.  Active Problems:   Coronary atherosclerosis of native coronary artery/chronic diastolic CHF - Last 2-D echo done 02/2012. EF 65-70 percent with grade 1 diastolic dysfunction. - Continue Imdur, metoprolol, aspirin and statin.    Mixed hyperlipidemia - Continue Pravachol.    Essential  hypertension, benign - Continue Norvasc.    Type 2 diabetes mellitus with circulatory disorder (HCC) - Continue Amaryl.    Hypercalcemia - Hydrate and monitor.    Acute kidney injury ([place]) - Hold Hyzaar. Vigorously hydrate.    Leukocytosis - May be from occult infection versus demargination from steroid use. - Discontinue prednisone.  Left hip pain - Hold baclofen for now. Discontinue prednisone. - Consider imaging of left hip if not symptomatically improved in 24 hours.  DVT prophylaxis - Lovenox ordered.  Code Status / Family Communication / Disposition Plan:   Code Status: Full. Family Communication: Daughter at the bedside. Disposition Plan: Home when stable.  Attestation regarding necessity of inpatient status:   The appropriate admission status for this patient is INPATIENT. Inpatient status is judged to be reasonable and necessary in order to provide the required intensity of service to ensure the patient's safety. The patient's presenting symptoms, physical exam findings, and initial radiographic and laboratory data in the context of their chronic comorbidities is felt to place them at high risk for further clinical deterioration. Furthermore, it is not anticipated that the patient will be medically stable for discharge from the hospital within 2 midnights of admission. The following factors support the admission status of inpatient.   -The patient's presenting symptoms include rigors. - The worrisome physical exam findings include left hip pain. - The initial radiographic and laboratory data are worrisome because of elevated lactic acid, leukocytosis. - The chronic co-morbidities include coronary artery disease, diabetes, hypertension. - Patient requires inpatient status due to high intensity of service, high risk for further deterioration and high frequency of surveillance required. - I certify that at the point of admission it is my clinical judgment that the  patient will require inpatient hospital care spanning beyond 2 midnights from the point of admission.   Time spent: 70 minutes.  Dayelin Balducci [facility] Pager [phone] Cell: [phone]   If 7PM-7AM, please contact night-coverage [email or web address] Password [facility] 09/04/2015, 11:41 AM

## 2015-09-04 NOTE — Progress Notes (Signed)
Rx Brief note:  Lovenox  Wt=80 kg CrCl~19.2 ml/min Rx adjusted Lovenox to 30mg  daily in pt with CrCl<30 ml/min  Thanks Dorrene German 09/04/2015 10:53 AM

## 2015-09-04 NOTE — ED Notes (Signed)
Pt ambulated to restroom with assistance from tech. Pt was a little wobbly and CO of being weak when walking back to room from restroom

## 2015-09-05 DIAGNOSIS — M25552 Pain in left hip: Secondary | ICD-10-CM

## 2015-09-05 DIAGNOSIS — D72829 Elevated white blood cell count, unspecified: Secondary | ICD-10-CM

## 2015-09-05 DIAGNOSIS — N179 Acute kidney failure, unspecified: Secondary | ICD-10-CM

## 2015-09-05 DIAGNOSIS — E119 Type 2 diabetes mellitus without complications: Secondary | ICD-10-CM

## 2015-09-05 DIAGNOSIS — I10 Essential (primary) hypertension: Secondary | ICD-10-CM

## 2015-09-05 DIAGNOSIS — R6510 Systemic inflammatory response syndrome (SIRS) of non-infectious origin without acute organ dysfunction: Secondary | ICD-10-CM

## 2015-09-05 LAB — BASIC METABOLIC PANEL
ANION GAP: 8 (ref 5–15)
BUN: 32 mg/dL — AB (ref 6–20)
CALCIUM: 8.9 mg/dL (ref 8.9–10.3)
CO2: 26 mmol/L (ref 22–32)
Chloride: 107 mmol/L (ref 101–111)
Creatinine, Ser: 1.41 mg/dL — ABNORMAL HIGH (ref 0.44–1.00)
GFR calc Af Amer: 41 mL/min — ABNORMAL LOW (ref >60)
GFR, EST NON AFRICAN AMERICAN: 35 mL/min — AB (ref >60)
GLUCOSE: 115 mg/dL — AB (ref 65–99)
Potassium: 3.3 mmol/L — ABNORMAL LOW (ref 3.5–5.1)
Sodium: 141 mmol/L (ref 135–145)

## 2015-09-05 LAB — GLUCOSE, CAPILLARY
GLUCOSE-CAPILLARY: 104 mg/dL — AB (ref 65–99)
Glucose-Capillary: 355 mg/dL — ABNORMAL HIGH (ref 65–99)
Glucose-Capillary: 80 mg/dL (ref 65–99)
Glucose-Capillary: 193 mg/dL — ABNORMAL HIGH (ref 65–99)

## 2015-09-05 LAB — CBC WITH DIFFERENTIAL/PLATELET
BASOS ABS: 0 10*3/uL (ref 0.0–0.1)
Basophils Relative: 0 %
EOS PCT: 5 %
Eosinophils Absolute: 0.5 10*3/uL (ref 0.0–0.7)
HEMATOCRIT: 34.5 % — AB (ref 36.0–46.0)
Hemoglobin: 10.8 g/dL — ABNORMAL LOW (ref 12.0–15.0)
Lymphocytes Relative: 16 %
Lymphs Abs: 1.7 10*3/uL (ref 0.7–4.0)
MCH: 25 pg — AB (ref 26.0–34.0)
MCHC: 31.3 g/dL (ref 30.0–36.0)
MCV: 79.9 fL (ref 78.0–100.0)
Monocytes Absolute: 0.8 10*3/uL (ref 0.1–1.0)
Monocytes Relative: 7 %
Neutro Abs: 7.6 10*3/uL (ref 1.7–7.7)
Neutrophils Relative %: 72 %
PLATELETS: 303 10*3/uL (ref 150–400)
RBC: 4.32 MIL/uL (ref 3.87–5.11)
RDW: 16.3 % — AB (ref 11.5–15.5)
WBC: 10.6 10*3/uL — AB (ref 4.0–10.5)

## 2015-09-05 LAB — URINE CULTURE: Culture: NO GROWTH

## 2015-09-05 LAB — CBC
HCT: 34.8 % — ABNORMAL LOW (ref 36.0–46.0)
HEMOGLOBIN: 10.7 g/dL — AB (ref 12.0–15.0)
MCH: 24.4 pg — ABNORMAL LOW (ref 26.0–34.0)
MCHC: 30.5 g/dL (ref 30.0–36.0)
MCV: 80.2 fL (ref 78.0–100.0)
PLATELETS: 313 10*3/uL (ref 150–400)
RBC: 4.34 MIL/uL (ref 3.87–5.11)
RDW: 16.4 % — AB (ref 11.5–15.5)
WBC: 11.5 10*3/uL — ABNORMAL HIGH (ref 4.0–10.5)

## 2015-09-05 LAB — MRSA PCR SCREENING: MRSA by PCR: NEGATIVE

## 2015-09-05 MED ORDER — PIPERACILLIN-TAZOBACTAM 3.375 G IVPB
3.3750 g | Freq: Three times a day (TID) | INTRAVENOUS | Status: DC
  Administered 2015-09-05: 3.375 g via INTRAVENOUS

## 2015-09-05 MED ORDER — INSULIN ASPART 100 UNIT/ML ~~LOC~~ SOLN
0.0000 [IU] | Freq: Three times a day (TID) | SUBCUTANEOUS | Status: DC
  Administered 2015-09-05: 15 [IU] via SUBCUTANEOUS
  Administered 2015-09-06 (×2): 3 [IU] via SUBCUTANEOUS

## 2015-09-05 MED ORDER — ENOXAPARIN SODIUM 40 MG/0.4ML ~~LOC~~ SOLN
40.0000 mg | Freq: Every day | SUBCUTANEOUS | Status: DC
  Administered 2015-09-05 – 2015-09-06 (×2): 40 mg via SUBCUTANEOUS
  Filled 2015-09-05 (×2): qty 0.4

## 2015-09-05 MED ORDER — VANCOMYCIN HCL 10 G IV SOLR
1250.0000 mg | INTRAVENOUS | Status: DC
  Filled 2015-09-05: qty 1250

## 2015-09-05 MED ORDER — POTASSIUM CHLORIDE CRYS ER 20 MEQ PO TBCR
40.0000 meq | EXTENDED_RELEASE_TABLET | Freq: Once | ORAL | Status: AC
  Administered 2015-09-05: 40 meq via ORAL
  Filled 2015-09-05: qty 2

## 2015-09-05 NOTE — Progress Notes (Addendum)
PROGRESS NOTE  Lindsey Frost OFB:[id number] DOB: 1941-03-21 DOA: 09/04/2015 PCP: Laverna Peace, NP  HPI/Recap of past 24 hours:  Reported feeling better,  Reported has n/v at home, feeling dehydrated, n/v has resolved since being admitted to the hospital Denies diarrhea, denies ab pain  Assessment/Plan: Principal Problem:   SIRS (systemic inflammatory response syndrome) (HCC) Active Problems:   Coronary atherosclerosis of native coronary artery   Mixed hyperlipidemia   Essential hypertension, benign   Chronic diastolic CHF (congestive heart failure) (HCC)   Type 2 diabetes mellitus with circulatory disorder (HCC)   Hypercalcemia   Acute kidney injury ([place])   Leukocytosis   Left hip pain  Sepsis? No source, ua unremarkable, cxr no acute findings, no fever, patient does not look septic. Will d/c abx for now. If spike fever, will restart abx. Blood culture pending.  Leukocytosis: patient was started on prednisone a few days ago on 1/25, she developed n/v after that, so leukocytosis would be from steroids and dehydration.  Lactic acidosis: from dehydration, hypoperfusion vs metformin  ARF: ua/ct ab unremarkable, better with ivf, continue.  Hypokalemia: replace k, check mag  Left hip pain, CT ab/pel unremarkable, doe has point tenderness at left SI joint, patient was seen by pmd for this and was started on prednisone, advised patient if she does not get better from this , will likely need steroid injection to left SI joint,  For now will get PT, if spike fever, will get MRI hip, but less likely infection, esr wnl.  noninsulin dependent diabetes: a1c pending, hold oral meds, on ssi here.  Code Status: full  Family Communication: patient and husband in room (patient reported her daughter is a Software engineer at [employer])  Disposition Plan: home in 1-2 days   Consultants:  none  Procedures:  none  Antibiotics:  Vanc/zosyn from admission to 1/29   Objective: BP 143/54  mmHg  Pulse 60  Temp(Src) 98.3 F (36.8 C) (Oral)  Resp 16  Ht 5' 7"  (1.702 m)  Wt 80.287 kg (177 lb)  BMI 27.72 kg/m2  SpO2 93%  Intake/Output Summary (Last 24 hours) at 09/05/15 0827 Last data filed at 09/05/15 7824  Gross per 24 hour  Intake 2168.4 ml  Output   1200 ml  Net  968.4 ml   Filed Weights   09/04/15 0747  Weight: 80.287 kg (177 lb)    Exam:   General:  NAD  Cardiovascular: RRR  Respiratory: CTABL  Abdomen: Soft/ND/NT, positive BS  Musculoskeletal: No Edema, point tenderness left buttuck, no erythema, no edema.   Neuro: aaox3  Data Reviewed: Basic Metabolic Panel:  Recent Labs Lab 09/04/15 0630 09/05/15 0408  NA 140 141  K 4.4 3.3*  CL 102 107  CO2 20* 26  GLUCOSE 186* 115*  BUN 45* 32*  CREATININE 2.72* 1.41*  CALCIUM 10.4* 8.9   Liver Function Tests:  Recent Labs Lab 09/04/15 0630  AST 23  ALT 9*  ALKPHOS 76  BILITOT 0.5  PROT 8.2*  ALBUMIN 4.5   No results for input(s): LIPASE, AMYLASE in the last 168 hours. No results for input(s): AMMONIA in the last 168 hours. CBC:  Recent Labs Lab 09/04/15 0630 09/05/15 0408  WBC 19.9* 11.5*  HGB 13.6 10.7*  HCT 44.4 34.8*  MCV 82.7 80.2  PLT 394 313   Cardiac Enzymes:   No results for input(s): CKTOTAL, CKMB, CKMBINDEX, TROPONINI in the last 168 hours. BNP (last 3 results) No results for input(s): BNP in the last 8760  hours.  ProBNP (last 3 results) No results for input(s): PROBNP in the last 8760 hours.  CBG:  Recent Labs Lab 09/04/15 0639 09/04/15 1230 09/04/15 1736 09/04/15 2124 09/05/15 0808  GLUCAP 173* 206* 176* 189* 104*    Recent Results (from the past 240 hour(s))  Blood culture (routine x 2)     Status: None (Preliminary result)   Collection Time: 09/04/15  7:50 AM  Result Value Ref Range Status   Specimen Description   Final    BLOOD LEFT ANTECUBITAL Performed at [facility]   Final   Culture PENDING  Incomplete   Report Status PENDING  Incomplete     Studies: Ct Renal Stone Study  09/04/2015  CLINICAL DATA:  Flank pain EXAM: CT ABDOMEN AND PELVIS WITHOUT CONTRAST TECHNIQUE: Multidetector CT imaging of the abdomen and pelvis was performed following the standard protocol without IV contrast. COMPARISON:  02/08/2012 FINDINGS: Lung bases are free of acute infiltrate or sizable effusion. A tiny subpleural nodule is noted in the right lower lobe best seen on image number 10 of series 4. This is stable from the prior exam and was shown previously to represent a calcified granuloma. The liver, spleen, gallbladder and pancreas are all normal in their CT appearance. The right adrenal gland is within normal limits. The left adrenal gland demonstrates a focal 1 cm hypodensity best seen on image number 24 series 2 likely representing a small adenoma. The appendix is within normal limits. The bladder is well distended. No pelvic mass lesion or sidewall abnormality is noted. The uterus has been surgically removed. Degenerative changes of the lumbar spine are noted. IMPRESSION: Stable granuloma in the right lung base. Left adrenal lesion likely representing an adenoma. No renal calculi or obstructive changes are noted. Electronically Signed   By: Inez Catalina M.D.   On: 09/04/2015 08:41    Scheduled Meds: . amLODipine  10 mg Oral q morning - 10a  . aspirin EC  162 mg Oral Daily  . enoxaparin (LOVENOX) injection  40 mg Subcutaneous Daily  . ferrous sulfate  325 mg Oral TID PC  . glimepiride  4 mg Oral Q breakfast  . isosorbide mononitrate  60 mg Oral Daily  . metoprolol  50 mg Oral BID  . multivitamin with minerals  1 tablet Oral Daily  . pantoprazole  40 mg Oral Daily  . piperacillin-tazobactam (ZOSYN)  IV  3.375 g Intravenous 3 times per day  . pravastatin  40 mg Oral QHS  . vancomycin  1,250 mg Intravenous Q24H    Continuous Infusions: . sodium chloride 100 mL/hr at 09/05/15  0520     Time spent: 43mns  Tidus Upchurch MD, PhD  [facility] Pager [phone] If 7PM-7AM, please contact night-coverage at [email or web address], password [facility]1/29/2017, 8:27 AM  LOS: 1 day

## 2015-09-05 NOTE — Progress Notes (Signed)
Pharmacy - Brief Note (antibiotic adjustment for improved renal function)  Pharmacy Consult for vancomycin/zosyn Indication: sepsis  Assessment: 6 YOF presents with tremors and altered mental status. Seen in PCP office this week for N/V, thought to be related to back pain and started on prednisone. AKI, elevated lactic acid, and leukocytosis noted on admission. No fever and VSS. No obvious source of infection.  Renal function has improved overnight with MIVF, lactic acid improving.   Cultures still pending.  Plan:  Based on improved renal function, change zosyn to 3.375gm IV q8h over 4h infusion  Change vancomycin to 1250mg  IV q24h  Continue to monitor renal function and check vancomycin trough if remains on vancomycin >= 4 days  Will adjust lovenox (per order comments for Rx may adjust) to 40mg  SQ q24h for CrCl > [place], PharmD, BCPS.   Pager: [phone] 09/05/2015 6:47 AM

## 2015-09-06 ENCOUNTER — Encounter (HOSPITAL_COMMUNITY): Payer: Self-pay | Admitting: *Deleted

## 2015-09-06 DIAGNOSIS — I5032 Chronic diastolic (congestive) heart failure: Secondary | ICD-10-CM

## 2015-09-06 DIAGNOSIS — E872 Acidosis: Secondary | ICD-10-CM

## 2015-09-06 LAB — GLUCOSE, CAPILLARY
GLUCOSE-CAPILLARY: 189 mg/dL — AB (ref 65–99)
Glucose-Capillary: 157 mg/dL — ABNORMAL HIGH (ref 65–99)

## 2015-09-06 LAB — BASIC METABOLIC PANEL
Anion gap: 10 (ref 5–15)
BUN: 15 mg/dL (ref 6–20)
CALCIUM: 9.3 mg/dL (ref 8.9–10.3)
CHLORIDE: 106 mmol/L (ref 101–111)
CO2: 26 mmol/L (ref 22–32)
CREATININE: 0.81 mg/dL (ref 0.44–1.00)
GFR calc Af Amer: >60 mL/min (ref >60)
GFR calc non Af Amer: >60 mL/min (ref >60)
GLUCOSE: 155 mg/dL — AB (ref 65–99)
Potassium: 3.8 mmol/L (ref 3.5–5.1)
Sodium: 142 mmol/L (ref 135–145)

## 2015-09-06 LAB — C-REACTIVE PROTEIN: CRP: <0.5 mg/dL (ref <1.0)

## 2015-09-06 LAB — MAGNESIUM: Magnesium: 1.7 mg/dL (ref 1.7–2.4)

## 2015-09-06 LAB — SEDIMENTATION RATE: SED RATE: 22 mm/h (ref 0–22)

## 2015-09-06 MED ORDER — MAGNESIUM SULFATE 2 GM/50ML IV SOLN
2.0000 g | Freq: Once | INTRAVENOUS | Status: AC
  Administered 2015-09-06: 2 g via INTRAVENOUS
  Filled 2015-09-06: qty 50

## 2015-09-06 NOTE — Progress Notes (Signed)
Nursing Discharge Summary  Patient ID: Niger Louise Moncur MRN: [id number] DOB/AGE: 1940-10-18 75 y.o.  Admit date: 09/04/2015 Discharge date: 09/06/2015  Discharged Condition: good  Disposition: 01-Home or Self Care  Follow-up Information    Follow up with ALTHEIMER,MICHAEL D, MD In 1 month.   Specialty:  Endocrinology   Why:  diabetes control , repeat lactic acid level, to decide about metformin use   Contact information:   [address] [place] [address] [phone]       Follow up with [facility], NP On 09/09/2015.   Specialty:  Internal Medicine   Why:  hospital discharge follow up, repeat cbc/bmp at follow up, to discuss changing HCTZ to as needed base.   Contact information:   [place] [place]  [address] [phone]       Prescriptions Given: No new medications given. Patient follow up appointments and medications discussed with patient and husband.  Both verbalized understanding without further questions.  Means of Discharge: Patient taken downstairs via wheelchair to be discharged home.   Signed: Buel Ream 09/06/2015, 2:40 PM

## 2015-09-06 NOTE — Evaluation (Signed)
Physical Therapy One Time Evaluation Patient Details Name: Lindsey Frost MRN: [id number] DOB: April 23, 1941 Today's Date: 09/06/2015   History of Present Illness  75 y.o. female diabetes, hypertension, CAD, OSA noncompliant with CPAP, CHF, MI, arthritis admitted for SIRS.  Pt also with recent L hip pain: CT ab/pel unremarkable, point tenderness at left SI joint per MD note.  Clinical Impression  Patient evaluated by Physical Therapy with no further acute PT needs identified. All education has been completed and the patient has no further questions.  Pt reports L sacral area pain (one point, nonradiating) for about a week.  Pt also presents with abnormal gait pattern (see below).  Recommended pt f/u with outpatient PT after ortho MD visit. See below for any follow-up Physical Therapy or equipment needs. PT is signing off. Thank you for this referral.     Follow Up Recommendations Outpatient PT    Equipment Recommendations  None recommended by PT    Recommendations for Other Services       Precautions / Restrictions Precautions Precautions: Fall      Mobility  Bed Mobility Overal bed mobility: Independent                Transfers Overall transfer level: Needs assistance Equipment used: None Transfers: Sit to/from Stand Sit to Stand: Supervision            Ambulation/Gait Ambulation/Gait assistance: Min guard;Supervision Ambulation Distance (Feet): 120 Feet Assistive device: None Gait Pattern/deviations: Step-through pattern;Decreased stance time - left     General Gait Details: increased right lateral trunk lean, pt reports "limp" since L TKA surgery, L sacral area pain 5/10 with ambulation, nonradiating.  Stairs            Wheelchair Mobility    Modified Rankin (Stroke Patients Only)       Balance                                             Pertinent Vitals/Pain Pain Assessment: 0-10 Pain Score: 5  Pain Location: L  sacral area with ambulation Pain Descriptors / Indicators: Sharp Pain Intervention(s): Limited activity within patient's tolerance;Monitored during session;Repositioned    Home Living Family/patient expects to be discharged to:: Private residence Living Arrangements: Spouse/significant other   Type of Home: House Home Access: Stairs to enter Entrance Stairs-Rails: None Entrance Stairs-Number of Steps: 2 Home Layout: One level Home Equipment: Environmental consultant - 2 wheels;Cane - single point      Prior Function Level of Independence: Independent with assistive device(s)         Comments: uses a SPC as needed     Hand Dominance        Extremity/Trunk Assessment               Lower Extremity Assessment: LLE deficits/detail   LLE Deficits / Details: grossly at least 3+/5 throughout, pain with hip IR     Communication   Communication: No difficulties  Cognition Arousal/Alertness: Awake/alert Behavior During Therapy: WFL for tasks assessed/performed Overall Cognitive Status: Within Functional Limits for tasks assessed                      General Comments      Exercises        Assessment/Plan    PT Assessment All further PT needs can be met in the next venue of  care  PT Diagnosis Abnormality of gait   PT Problem List Pain;Decreased mobility  PT Treatment Interventions     PT Goals (Current goals can be found in the Care Plan section) Acute Rehab PT Goals PT Goal Formulation: All assessment and education complete, DC therapy    Frequency     Barriers to discharge        Co-evaluation               End of Session   Activity Tolerance: Patient tolerated treatment well Patient left: in bed;with call bell/phone within reach;with family/visitor present           Time: 4259-5638 PT Time Calculation (min) (ACUTE ONLY): 17 min   Charges:   PT Evaluation $PT Eval Low Complexity: 1 Procedure     PT G Codes:        Donal Lynam,KATHrine  E 09/06/2015, 12:31 PM Carmelia Bake, PT, DPT 09/06/2015 Pager: [phone]

## 2015-09-06 NOTE — Discharge Summary (Signed)
Discharge Summary  Lindsey Frost LTJ:[id number] DOB: 04/12/1941  PCP: Laverna Peace, NP  Admit date: 09/04/2015 Discharge date: 09/06/2015  Time spent: <72mns  Recommendations for Outpatient Follow-up:  1. F/u with PMD on 2/2 for hospital follow up, repeat cbc/bmp at follow up 2. F/u with endocrinology for diabetes control, check lactic acidosis, endocrinology to decide on metformin use  Discharge Diagnoses:  Active Hospital Problems   Diagnosis Date Noted  . SIRS (systemic inflammatory response syndrome) ([facility] 09/04/2015  . Chronic diastolic CHF (congestive heart failure) ([facility] 09/04/2015  . Type 2 diabetes mellitus with circulatory disorder ([facility] 09/04/2015  . Hypercalcemia 09/04/2015  . Acute kidney injury ([facility] 09/04/2015  . Leukocytosis 09/04/2015  . Left hip pain 09/04/2015  . Coronary atherosclerosis of native coronary artery 02/08/2012  . Essential hypertension, benign 02/08/2012  . Mixed hyperlipidemia 02/08/2012    Resolved Hospital Problems   Diagnosis Date Noted Date Resolved  No resolved problems to display.    Discharge Condition: stable  Diet recommendation: heart healthy/carb modified  Filed Weights   09/04/15 0747  Weight: 80.287 kg (177 lb)    History of present illness:  INigerLXavia Kniskernis an 75y.o. female diabetes, hypertension, CAD, OSA noncompliant with C Pap who presents with the sudden onset of shaking chills this morning. There was no associated fever or diaphoresis at the time. Saw PCP 3 days ago for evaluation of left hip pain, diagnosed with "muscle spasms" and was treated with Tramadol, prednisone and baclofen, which helped the hip pain. Had some N/V prior to having the hip pain, but this has resolved. Family confided to the ED physician that she has been more repetitive verbally within normal. Of concern, an initial lactate was 5.5 however the patient did not have any frank source of infection. Chest x-ray was clear. Urinalysis was  negative. CT with renal protocol did not show any evidence of nephrolithiasis or pyelonephritis. WBC elevated to 19.9.  Hospital Course:  Principal Problem:   SIRS (systemic inflammatory response syndrome) (HCC) Active Problems:   Coronary atherosclerosis of native coronary artery   Mixed hyperlipidemia   Essential hypertension, benign   Chronic diastolic CHF (congestive heart failure) (HCC)   Type 2 diabetes mellitus with circulatory disorder (HCC)   Hypercalcemia   Acute kidney injury ([facility]   Leukocytosis   Left hip pain  Sepsis? No source, ua unremarkable, cxr no acute findings, no fever, patient does not look septic. She received vanc/cefepime empirically on admission, these was stopped the next day, patient continue to be afebrile, all lab works has normalized.. Blood culture no growth, mrsa screening negative.  Leukocytosis:  patient was started on prednisone a few days ago on 1/25, she developed n/v after that, so leukocytosis would be from steroids and dehydration. Resolved with hydration, pcp to repeat labs at follow up.  Lactic acidosis: from dehydration, hypoperfusion vs metformin. Metformin held during hospitalization, patient received fluids, improved.  ARF: ua/ct ab unremarkable, resolved with ivf  Hypokalemia: replaced k, mag wnl  Left hip pain, CT ab/pel unremarkable, dose has point tenderness at left SI joint, patient was seen by pmd for this and was started on prednisone, advised patient if she does not get better, she possibly will benefit from steroid injection to left SI joint,  no fever, esr /crp wnl. PT cleared patient to discharge with outpatient PT.  noninsulin dependent diabetes: a1c pending, hold oral meds, on ssi here. Metformin resumed at discharge, Patient is to follow up with endocrinology to repeat lactic  acid and decide on metformin use.  Code Status: full  Family Communication: patient and husband in room (patient reported her daughter is a  Software engineer at [employer])  Disposition Plan: home on 1/30   Consultants:  none  Procedures:  none  Antibiotics:  Vanc/zosyn from admission to 1/29   Discharge Exam: BP 137/68 mmHg  Pulse 62  Temp(Src) 97.5 F (36.4 C) (Oral)  Resp 18  Ht _0  (1.702 m)  Wt 80.287 kg (177 lb)  BMI 27.72 kg/m2  SpO2 97%    General: NAD  Cardiovascular: RRR  Respiratory: CTABL  Abdomen: Soft/ND/NT, positive BS  Musculoskeletal: No Edema, point tenderness left buttock has improved, no erythema, no edema.   Neuro: aaox3   Discharge Instructions You were cared for by a hospitalist during your hospital stay. If you have any questions about your discharge medications or the care you received while you were in the hospital after you are discharged, you can call the unit and asked to speak with the hospitalist on call if the hospitalist that took care of you is not available. Once you are discharged, your primary care physician will handle any further medical issues. Please note that NO REFILLS for any discharge medications will be authorized once you are discharged, as it is imperative that you return to your primary care physician (or establish a relationship with a primary care physician if you do not have one) for your aftercare needs so that they can reassess your need for medications and monitor your lab values.      Discharge Instructions    Diet - low sodium heart healthy    Complete by:  As directed   Carb modified     Increase activity slowly    Complete by:  As directed             Medication List    STOP taking these medications        PNEUMOVAX 23 25 MCG/0.5ML injection  Generic drug:  pneumococcal 23 valent vaccine     predniSONE 20 MG tablet  Commonly known as:  DELTASONE      TAKE these medications        amLODipine 10 MG tablet  Commonly known as:  NORVASC  Take 10 mg by mouth every morning.     aspirin EC 81 MG tablet  Take 162 mg by mouth every  morning.     baclofen 10 MG tablet  Commonly known as:  LIORESAL  take 1 tablet by mouth every 4 to 6 hours if needed     CALTRATE 600 PLUS-VIT D PO  Take 1 tablet by mouth every morning.     glimepiride 4 MG tablet  Commonly known as:  AMARYL  Take 4 mg by mouth daily with breakfast.     GLUCOSAMINE CHONDR COMPLEX PO  Take 1 tablet by mouth 2 (two) times daily. 1500 GLUCOSAMINE AND 1200 CHONDROITIN     isosorbide mononitrate 60 MG 24 hr tablet  Commonly known as:  IMDUR  Take 60 mg by mouth daily.     losartan-hydrochlorothiazide 100-25 MG tablet  Commonly known as:  HYZAAR  Take 1 tablet by mouth daily.     metFORMIN 1000 MG tablet  Commonly known as:  GLUCOPHAGE  Take 1,000 mg by mouth 2 (two) times daily with a meal.     metoprolol 50 MG tablet  Commonly known as:  LOPRESSOR  Take 50 mg by mouth 2 (two) times daily.  multivitamin with minerals Tabs tablet  Take 1 tablet by mouth daily.     pantoprazole 40 MG tablet  Commonly known as:  PROTONIX  Take 40 mg by mouth daily.     pravastatin 40 MG tablet  Commonly known as:  PRAVACHOL  Take 40 mg by mouth at bedtime.     traMADol 50 MG tablet  Commonly known as:  ULTRAM  Take 50-100 mg by mouth every 4 (four) hours as needed for moderate pain.     triamcinolone cream 0.1 %  Commonly known as:  KENALOG  apply to affected area ON FACE twice a day for 5 to 7 days       No Known Allergies Follow-up Information    Follow up with ALTHEIMER,MICHAEL D, MD In 1 month.   Specialty:  Endocrinology   Why:  diabetes control , repeat lactic acid level, to decide about metformin use   Contact information:   [address] [place] [address] [phone]       Follow up with [facility], NP On 09/09/2015.   Specialty:  Internal Medicine   Why:  hospital discharge follow up, repeat cbc/bmp at follow up, to discuss changing HCTZ to as needed base.   Contact information:   [place], [place] [address] [phone]        The results of significant diagnostics from this hospitalization (including imaging, microbiology, ancillary and laboratory) are listed below for reference.    Significant Diagnostic Studies: Dg Chest 1 View  09/04/2015  CLINICAL DATA:  Possible sepsis EXAM: CHEST 1 VIEW COMPARISON:  10/19/2014 FINDINGS: Cardiac shadow is mildly enlarged. The lungs are well aerated bilaterally. No focal bony abnormality is seen. IMPRESSION: No acute abnormality noted. Electronically Signed   By: Inez Catalina M.D.   On: 09/04/2015 07:38   Ct Renal Stone Study  09/04/2015  CLINICAL DATA:  Flank pain EXAM: CT ABDOMEN AND PELVIS WITHOUT CONTRAST TECHNIQUE: Multidetector CT imaging of the abdomen and pelvis was performed following the standard protocol without IV contrast. COMPARISON:  02/08/2012 FINDINGS: Lung bases are free of acute infiltrate or sizable effusion. A tiny subpleural nodule is noted in the right lower lobe best seen on image number 10 of series 4. This is stable from the prior exam and was shown previously to represent a calcified granuloma. The liver, spleen, gallbladder and pancreas are all normal in their CT appearance. The right adrenal gland is within normal limits. The left adrenal gland demonstrates a focal 1 cm hypodensity best seen on image number 24 series 2 likely representing a small adenoma. The appendix is within normal limits. The bladder is well distended. No pelvic mass lesion or sidewall abnormality is noted. The uterus has been surgically removed. Degenerative changes of the lumbar spine are noted. IMPRESSION: Stable granuloma in the right lung base. Left adrenal lesion likely representing an adenoma. No renal calculi or obstructive changes are noted. Electronically Signed   By: Inez Catalina M.D.   On: 09/04/2015 08:41    Microbiology: Recent Results (from the past 240 hour(s))  Urine culture     Status: None   Collection Time: 09/04/15  6:57  AM  Result Value Ref Range Status   Specimen Description URINE, CATHETERIZED  Final   Special Requests NONE  Final   Culture   Final    NO GROWTH 1 DAY Performed at [facility]    Report Status 09/05/2015 FINAL  Final  Blood culture (  routine x 2)     Status: None (Preliminary result)   Collection Time: 09/04/15  7:50 AM  Result Value Ref Range Status   Specimen Description BLOOD LEFT ANTECUBITAL  Final   Special Requests BOTTLES DRAWN AEROBIC AND ANAEROBIC 5CC  Final   Culture   Final    NO GROWTH 2 DAYS Performed at [facility]    Report Status PENDING  Incomplete  Blood culture (routine x 2)     Status: None (Preliminary result)   Collection Time: 09/04/15  7:58 AM  Result Value Ref Range Status   Specimen Description BLOOD LEFT HAND  Final   Special Requests IN PEDIATRIC BOTTLE 3CC  Final   Culture   Final    NO GROWTH 2 DAYS Performed at [facility]    Report Status PENDING  Incomplete  MRSA PCR Screening     Status: None   Collection Time: 09/05/15 11:25 AM  Result Value Ref Range Status   MRSA by PCR NEGATIVE NEGATIVE Final    Comment:        The GeneXpert MRSA Assay (FDA approved for NASAL specimens only), is one component of a comprehensive MRSA colonization surveillance program. It is not intended to diagnose MRSA infection nor to guide or monitor treatment for MRSA infections.      Labs: Basic Metabolic Panel:  Recent Labs Lab 09/04/15 0630 09/05/15 0408 09/06/15 0504  NA 140 141 142  K 4.4 3.3* 3.8  CL 102 107 106  CO2 20* 26 26  GLUCOSE 186* 115* 155*  BUN 45* 32* 15  CREATININE 2.72* 1.41* 0.81  CALCIUM 10.4* 8.9 9.3  MG  --   --  1.7   Liver Function Tests:  Recent Labs Lab 09/04/15 0630  AST 23  ALT 9*  ALKPHOS 76  BILITOT 0.5  PROT 8.2*  ALBUMIN 4.5   No results for input(s): LIPASE, AMYLASE in the last 168 hours. No results for input(s): AMMONIA in the last 168 hours. CBC:  Recent Labs Lab  09/04/15 0630 09/05/15 0408 09/05/15 0837  WBC 19.9* 11.5* 10.6*  NEUTROABS  --   --  7.6  HGB 13.6 10.7* 10.8*  HCT 44.4 34.8* 34.5*  MCV 82.7 80.2 79.9  PLT 394 313 303   Cardiac Enzymes: No results for input(s): CKTOTAL, CKMB, CKMBINDEX, TROPONINI in the last 168 hours. BNP: BNP (last 3 results) No results for input(s): BNP in the last 8760 hours.  ProBNP (last 3 results) No results for input(s): PROBNP in the last 8760 hours.  CBG:  Recent Labs Lab 09/05/15 1204 09/05/15 1727 09/05/15 2137 09/06/15 0808 09/06/15 1243  GLUCAP 355* 80 193* 157* 189*       Signed:  Vihana Kydd MD, PhD  [facility] 09/06/2015, 10:01 PM

## 2015-09-06 NOTE — Care Management Note (Signed)
Case Management Note  Patient Details  Name: Lindsey Frost MRN: [id number] Date of Birth: 05-19-1941  Subjective/Objective:       75 yo admitted with SIRS             Action/Plan: From home with husband.  PT recommendations are for Outpatient PT at discharge.  This CM met with pt and husband at bedside to discuss PT recommendations. Pt states she is unsure if she needs PT but pt was given a list of Outpatient PT locations, and encouraged to get a prescription from the MD for PT.  No other CM needs communicated.  Expected Discharge Date:                  Expected Discharge Plan:  Home/Self Care  In-House Referral:     Discharge planning Services  CM Consult  Post Acute Care Choice:    Choice offered to:     DME Arranged:    DME Agency:     HH Arranged:    [place] Agency:     Status of Service:  Completed, signed off  Medicare Important Message Given:    Date Medicare IM Given:    Medicare IM give by:    Date Additional Medicare IM Given:    Additional Medicare Important Message give by:     If discussed at [place] of Stay Meetings, dates discussed:    Additional Comments:  Lynnell Catalan, RN 09/06/2015, 10:01 AM

## 2015-09-07 LAB — HEMOGLOBIN A1C
HEMOGLOBIN A1C: 7.2 % — AB (ref 4.8–5.6)
MEAN PLASMA GLUCOSE: 160 mg/dL

## 2015-09-09 LAB — CULTURE, BLOOD (ROUTINE X 2)
Culture: NO GROWTH
Culture: NO GROWTH

## 2015-12-19 DIAGNOSIS — I110 Hypertensive heart disease with heart failure: Secondary | ICD-10-CM

## 2015-12-19 HISTORY — DX: Hypertensive heart disease with heart failure: I11.0

## 2016-03-27 ENCOUNTER — Other Ambulatory Visit: Payer: Self-pay | Admitting: Obstetrics and Gynecology

## 2016-03-27 DIAGNOSIS — R928 Other abnormal and inconclusive findings on diagnostic imaging of breast: Secondary | ICD-10-CM

## 2016-04-04 ENCOUNTER — Ambulatory Visit
Admission: RE | Admit: 2016-04-04 | Discharge: 2016-04-04 | Disposition: A | Discharge status: Home / Self Care | Payer: Medicare Other | Source: Ambulatory Visit | Attending: Obstetrics and Gynecology | Admitting: Obstetrics and Gynecology

## 2016-04-04 DIAGNOSIS — R928 Other abnormal and inconclusive findings on diagnostic imaging of breast: Secondary | ICD-10-CM

## 2016-09-18 DIAGNOSIS — I10 Essential (primary) hypertension: Secondary | ICD-10-CM | POA: Insufficient documentation

## 2016-09-18 DIAGNOSIS — G4730 Sleep apnea, unspecified: Secondary | ICD-10-CM | POA: Insufficient documentation

## 2016-09-18 DIAGNOSIS — E1165 Type 2 diabetes mellitus with hyperglycemia: Secondary | ICD-10-CM | POA: Diagnosis not present

## 2016-09-18 DIAGNOSIS — I2510 Atherosclerotic heart disease of native coronary artery without angina pectoris: Secondary | ICD-10-CM | POA: Diagnosis not present

## 2016-09-18 DIAGNOSIS — E1159 Type 2 diabetes mellitus with other circulatory complications: Secondary | ICD-10-CM | POA: Diagnosis not present

## 2016-09-18 DIAGNOSIS — E782 Mixed hyperlipidemia: Secondary | ICD-10-CM | POA: Diagnosis not present

## 2016-09-18 HISTORY — DX: Type 2 diabetes mellitus with hyperglycemia: E11.65

## 2016-09-19 DIAGNOSIS — Z1389 Encounter for screening for other disorder: Secondary | ICD-10-CM | POA: Diagnosis not present

## 2016-09-19 DIAGNOSIS — E785 Hyperlipidemia, unspecified: Secondary | ICD-10-CM | POA: Diagnosis not present

## 2016-09-19 DIAGNOSIS — Z9181 History of falling: Secondary | ICD-10-CM | POA: Diagnosis not present

## 2016-09-19 DIAGNOSIS — E119 Type 2 diabetes mellitus without complications: Secondary | ICD-10-CM | POA: Diagnosis not present

## 2016-09-19 DIAGNOSIS — K219 Gastro-esophageal reflux disease without esophagitis: Secondary | ICD-10-CM | POA: Diagnosis not present

## 2016-09-19 DIAGNOSIS — E1159 Type 2 diabetes mellitus with other circulatory complications: Secondary | ICD-10-CM | POA: Diagnosis not present

## 2016-11-17 DIAGNOSIS — D539 Nutritional anemia, unspecified: Secondary | ICD-10-CM | POA: Diagnosis not present

## 2016-11-17 DIAGNOSIS — R531 Weakness: Secondary | ICD-10-CM | POA: Diagnosis not present

## 2016-11-17 DIAGNOSIS — I10 Essential (primary) hypertension: Secondary | ICD-10-CM | POA: Diagnosis not present

## 2016-11-17 DIAGNOSIS — D509 Iron deficiency anemia, unspecified: Secondary | ICD-10-CM | POA: Diagnosis not present

## 2016-11-27 DIAGNOSIS — D510 Vitamin B12 deficiency anemia due to intrinsic factor deficiency: Secondary | ICD-10-CM | POA: Diagnosis not present

## 2016-12-07 DIAGNOSIS — R195 Other fecal abnormalities: Secondary | ICD-10-CM | POA: Diagnosis not present

## 2016-12-07 DIAGNOSIS — R0600 Dyspnea, unspecified: Secondary | ICD-10-CM | POA: Diagnosis not present

## 2016-12-07 DIAGNOSIS — I110 Hypertensive heart disease with heart failure: Secondary | ICD-10-CM | POA: Diagnosis not present

## 2016-12-07 DIAGNOSIS — G4733 Obstructive sleep apnea (adult) (pediatric): Secondary | ICD-10-CM | POA: Diagnosis not present

## 2016-12-07 DIAGNOSIS — I509 Heart failure, unspecified: Secondary | ICD-10-CM | POA: Diagnosis not present

## 2016-12-07 DIAGNOSIS — D649 Anemia, unspecified: Secondary | ICD-10-CM | POA: Diagnosis not present

## 2016-12-07 DIAGNOSIS — Z7982 Long term (current) use of aspirin: Secondary | ICD-10-CM | POA: Diagnosis not present

## 2016-12-07 DIAGNOSIS — I10 Essential (primary) hypertension: Secondary | ICD-10-CM | POA: Diagnosis not present

## 2016-12-07 DIAGNOSIS — R001 Bradycardia, unspecified: Secondary | ICD-10-CM | POA: Diagnosis not present

## 2016-12-07 DIAGNOSIS — Z7984 Long term (current) use of oral hypoglycemic drugs: Secondary | ICD-10-CM | POA: Diagnosis not present

## 2016-12-07 DIAGNOSIS — Z79899 Other long term (current) drug therapy: Secondary | ICD-10-CM | POA: Diagnosis not present

## 2016-12-07 DIAGNOSIS — I499 Cardiac arrhythmia, unspecified: Secondary | ICD-10-CM | POA: Diagnosis not present

## 2016-12-07 DIAGNOSIS — Z90710 Acquired absence of both cervix and uterus: Secondary | ICD-10-CM | POA: Diagnosis not present

## 2016-12-07 DIAGNOSIS — I2510 Atherosclerotic heart disease of native coronary artery without angina pectoris: Secondary | ICD-10-CM | POA: Diagnosis not present

## 2016-12-07 DIAGNOSIS — R609 Edema, unspecified: Secondary | ICD-10-CM | POA: Diagnosis not present

## 2016-12-07 DIAGNOSIS — E785 Hyperlipidemia, unspecified: Secondary | ICD-10-CM | POA: Diagnosis not present

## 2016-12-07 DIAGNOSIS — I493 Ventricular premature depolarization: Secondary | ICD-10-CM | POA: Diagnosis not present

## 2016-12-07 DIAGNOSIS — Z9849 Cataract extraction status, unspecified eye: Secondary | ICD-10-CM | POA: Diagnosis not present

## 2016-12-07 DIAGNOSIS — E119 Type 2 diabetes mellitus without complications: Secondary | ICD-10-CM | POA: Diagnosis not present

## 2016-12-07 DIAGNOSIS — I071 Rheumatic tricuspid insufficiency: Secondary | ICD-10-CM | POA: Diagnosis not present

## 2016-12-07 DIAGNOSIS — R0602 Shortness of breath: Secondary | ICD-10-CM | POA: Diagnosis not present

## 2016-12-07 DIAGNOSIS — D509 Iron deficiency anemia, unspecified: Secondary | ICD-10-CM | POA: Diagnosis not present

## 2016-12-07 DIAGNOSIS — I252 Old myocardial infarction: Secondary | ICD-10-CM | POA: Diagnosis not present

## 2016-12-08 DIAGNOSIS — I493 Ventricular premature depolarization: Secondary | ICD-10-CM

## 2016-12-08 DIAGNOSIS — I517 Cardiomegaly: Secondary | ICD-10-CM | POA: Diagnosis not present

## 2016-12-08 DIAGNOSIS — D649 Anemia, unspecified: Secondary | ICD-10-CM | POA: Insufficient documentation

## 2016-12-08 DIAGNOSIS — I361 Nonrheumatic tricuspid (valve) insufficiency: Secondary | ICD-10-CM | POA: Diagnosis not present

## 2016-12-08 DIAGNOSIS — R195 Other fecal abnormalities: Secondary | ICD-10-CM

## 2016-12-08 DIAGNOSIS — R609 Edema, unspecified: Secondary | ICD-10-CM

## 2016-12-08 DIAGNOSIS — R600 Localized edema: Secondary | ICD-10-CM

## 2016-12-08 DIAGNOSIS — G4733 Obstructive sleep apnea (adult) (pediatric): Secondary | ICD-10-CM

## 2016-12-08 DIAGNOSIS — I110 Hypertensive heart disease with heart failure: Secondary | ICD-10-CM

## 2016-12-08 HISTORY — DX: Obstructive sleep apnea (adult) (pediatric): G47.33

## 2016-12-08 HISTORY — DX: Edema, unspecified: R60.9

## 2016-12-08 HISTORY — DX: Anemia, unspecified: D64.9

## 2016-12-08 HISTORY — DX: Hypertensive heart disease with heart failure: I11.0

## 2016-12-08 HISTORY — DX: Other fecal abnormalities: R19.5

## 2016-12-08 HISTORY — DX: Ventricular premature depolarization: I49.3

## 2016-12-08 HISTORY — DX: Localized edema: R60.0

## 2016-12-11 DIAGNOSIS — E538 Deficiency of other specified B group vitamins: Secondary | ICD-10-CM | POA: Diagnosis not present

## 2016-12-11 DIAGNOSIS — I10 Essential (primary) hypertension: Secondary | ICD-10-CM | POA: Diagnosis not present

## 2016-12-11 DIAGNOSIS — R0600 Dyspnea, unspecified: Secondary | ICD-10-CM | POA: Diagnosis not present

## 2016-12-11 DIAGNOSIS — I509 Heart failure, unspecified: Secondary | ICD-10-CM | POA: Diagnosis not present

## 2016-12-11 DIAGNOSIS — Z139 Encounter for screening, unspecified: Secondary | ICD-10-CM | POA: Diagnosis not present

## 2016-12-11 DIAGNOSIS — D509 Iron deficiency anemia, unspecified: Secondary | ICD-10-CM | POA: Diagnosis not present

## 2016-12-12 DIAGNOSIS — L918 Other hypertrophic disorders of the skin: Secondary | ICD-10-CM | POA: Diagnosis not present

## 2016-12-12 DIAGNOSIS — E1165 Type 2 diabetes mellitus with hyperglycemia: Secondary | ICD-10-CM | POA: Diagnosis not present

## 2016-12-12 DIAGNOSIS — L718 Other rosacea: Secondary | ICD-10-CM | POA: Diagnosis not present

## 2016-12-12 DIAGNOSIS — E782 Mixed hyperlipidemia: Secondary | ICD-10-CM | POA: Diagnosis not present

## 2016-12-12 DIAGNOSIS — L821 Other seborrheic keratosis: Secondary | ICD-10-CM | POA: Diagnosis not present

## 2016-12-18 DIAGNOSIS — I10 Essential (primary) hypertension: Secondary | ICD-10-CM | POA: Diagnosis not present

## 2016-12-18 DIAGNOSIS — E1165 Type 2 diabetes mellitus with hyperglycemia: Secondary | ICD-10-CM | POA: Diagnosis not present

## 2016-12-18 DIAGNOSIS — I2510 Atherosclerotic heart disease of native coronary artery without angina pectoris: Secondary | ICD-10-CM | POA: Diagnosis not present

## 2016-12-18 DIAGNOSIS — E1159 Type 2 diabetes mellitus with other circulatory complications: Secondary | ICD-10-CM | POA: Diagnosis not present

## 2016-12-18 DIAGNOSIS — G4730 Sleep apnea, unspecified: Secondary | ICD-10-CM | POA: Diagnosis not present

## 2016-12-18 DIAGNOSIS — E782 Mixed hyperlipidemia: Secondary | ICD-10-CM | POA: Diagnosis not present

## 2016-12-20 DIAGNOSIS — I10 Essential (primary) hypertension: Secondary | ICD-10-CM | POA: Diagnosis not present

## 2016-12-20 DIAGNOSIS — I25119 Atherosclerotic heart disease of native coronary artery with unspecified angina pectoris: Secondary | ICD-10-CM | POA: Diagnosis not present

## 2016-12-20 DIAGNOSIS — I5032 Chronic diastolic (congestive) heart failure: Secondary | ICD-10-CM | POA: Diagnosis not present

## 2016-12-20 DIAGNOSIS — E785 Hyperlipidemia, unspecified: Secondary | ICD-10-CM | POA: Diagnosis not present

## 2016-12-25 DIAGNOSIS — Z79899 Other long term (current) drug therapy: Secondary | ICD-10-CM | POA: Diagnosis not present

## 2016-12-27 DIAGNOSIS — D500 Iron deficiency anemia secondary to blood loss (chronic): Secondary | ICD-10-CM | POA: Diagnosis not present

## 2017-01-04 DIAGNOSIS — K921 Melena: Secondary | ICD-10-CM | POA: Diagnosis not present

## 2017-01-04 DIAGNOSIS — Z1211 Encounter for screening for malignant neoplasm of colon: Secondary | ICD-10-CM | POA: Diagnosis not present

## 2017-01-04 DIAGNOSIS — K635 Polyp of colon: Secondary | ICD-10-CM | POA: Diagnosis not present

## 2017-01-04 DIAGNOSIS — D125 Benign neoplasm of sigmoid colon: Secondary | ICD-10-CM | POA: Diagnosis not present

## 2017-01-04 DIAGNOSIS — D120 Benign neoplasm of cecum: Secondary | ICD-10-CM | POA: Diagnosis not present

## 2017-01-04 DIAGNOSIS — D500 Iron deficiency anemia secondary to blood loss (chronic): Secondary | ICD-10-CM | POA: Diagnosis not present

## 2017-01-04 DIAGNOSIS — R195 Other fecal abnormalities: Secondary | ICD-10-CM | POA: Diagnosis not present

## 2017-01-04 DIAGNOSIS — Z86010 Personal history of colonic polyps: Secondary | ICD-10-CM | POA: Diagnosis not present

## 2017-01-04 DIAGNOSIS — D509 Iron deficiency anemia, unspecified: Secondary | ICD-10-CM | POA: Diagnosis not present

## 2017-01-04 DIAGNOSIS — D124 Benign neoplasm of descending colon: Secondary | ICD-10-CM | POA: Diagnosis not present

## 2017-01-16 DIAGNOSIS — D509 Iron deficiency anemia, unspecified: Secondary | ICD-10-CM | POA: Diagnosis not present

## 2017-01-16 DIAGNOSIS — I509 Heart failure, unspecified: Secondary | ICD-10-CM | POA: Diagnosis not present

## 2017-01-16 DIAGNOSIS — R0600 Dyspnea, unspecified: Secondary | ICD-10-CM | POA: Diagnosis not present

## 2017-01-17 IMAGING — DX DG CHEST 1V
1 series · 1 of 1 positions shown · non-contrast
Comparison: 10/19/2014

CLINICAL DATA: Possible sepsis

EXAM:
CHEST 1 VIEW

[chest ap]
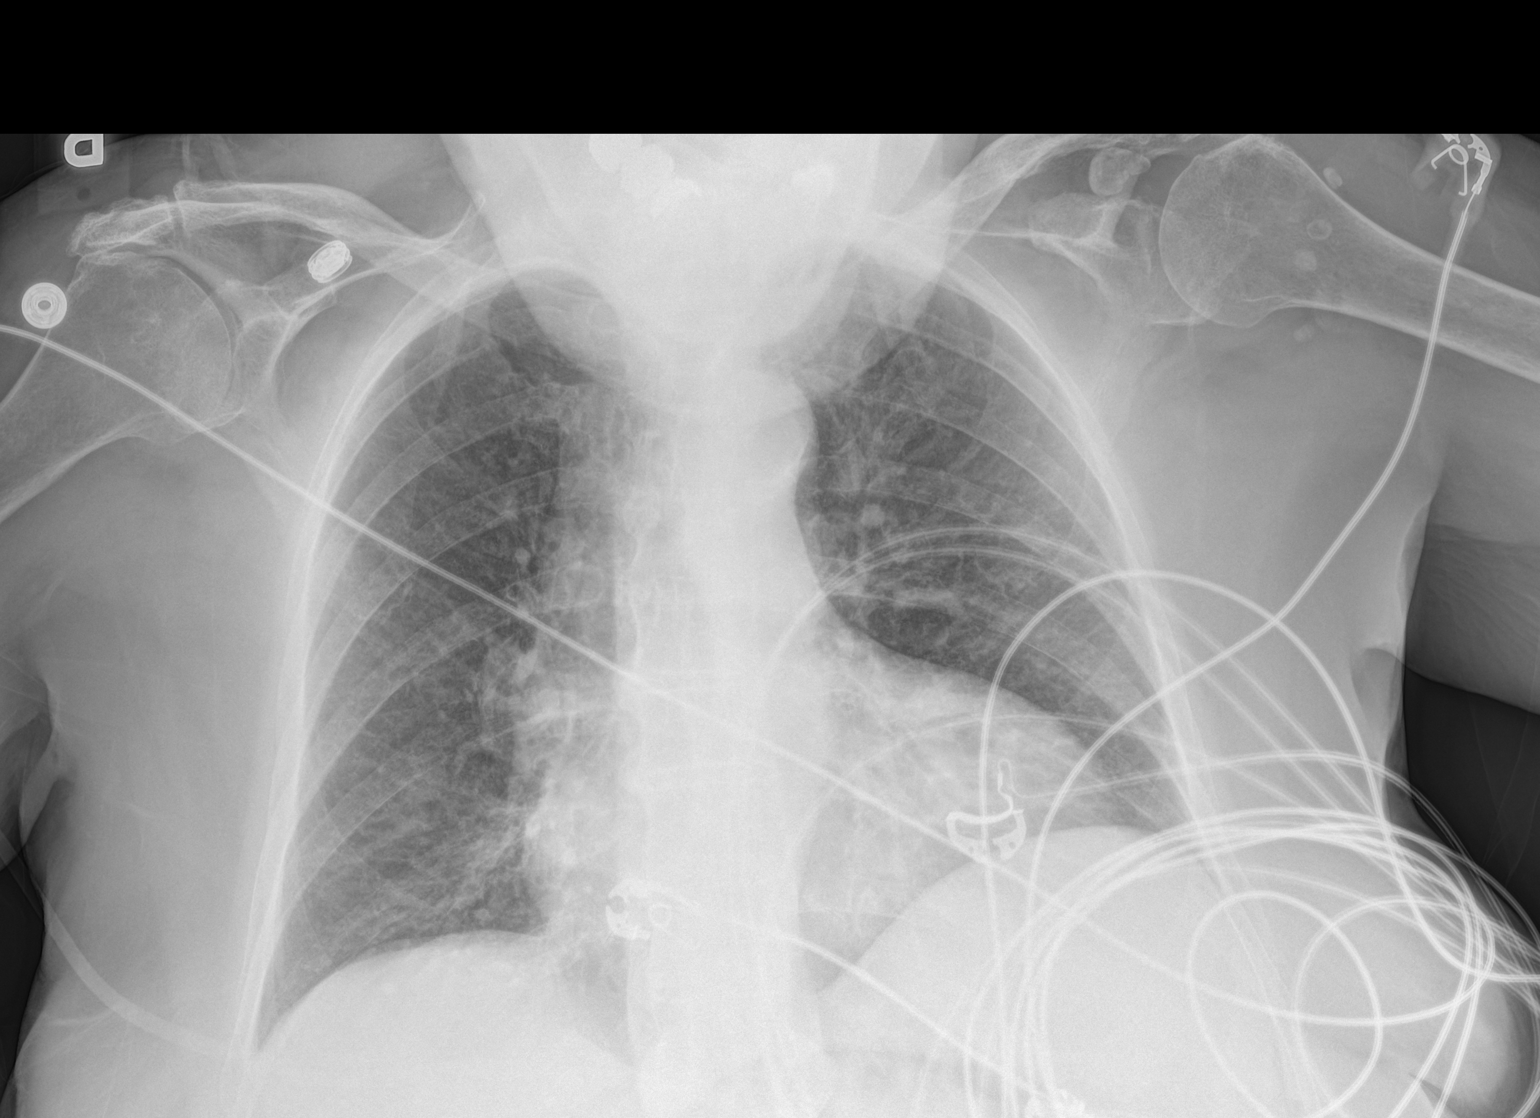

[1 of 1 positions shown; findings below may reference images not displayed]

FINDINGS: Cardiac shadow is mildly enlarged. The lungs are well aerated
bilaterally. No focal bony abnormality is seen.
IMPRESSION: No acute abnormality noted.

## 2017-02-15 ENCOUNTER — Encounter: Payer: Self-pay | Admitting: Cardiology

## 2017-02-19 ENCOUNTER — Ambulatory Visit (INDEPENDENT_AMBULATORY_CARE_PROVIDER_SITE_OTHER): Payer: PPO | Admitting: Cardiology

## 2017-02-19 ENCOUNTER — Encounter: Payer: Self-pay | Admitting: Cardiology

## 2017-02-19 VITALS — BP 158/78 | HR 74 | Ht 61.0 in | Wt 180.4 lb

## 2017-02-19 DIAGNOSIS — I110 Hypertensive heart disease with heart failure: Secondary | ICD-10-CM

## 2017-02-19 DIAGNOSIS — E782 Mixed hyperlipidemia: Secondary | ICD-10-CM | POA: Diagnosis not present

## 2017-02-19 DIAGNOSIS — I5032 Chronic diastolic (congestive) heart failure: Secondary | ICD-10-CM

## 2017-02-19 DIAGNOSIS — I25119 Atherosclerotic heart disease of native coronary artery with unspecified angina pectoris: Secondary | ICD-10-CM | POA: Diagnosis not present

## 2017-02-19 MED ORDER — NITROGLYCERIN 0.4 MG SL SUBL: 0.4000 mg | SUBLINGUAL_TABLET | SUBLINGUAL | 11 refills | Status: DC | PRN

## 2017-02-19 NOTE — Progress Notes (Signed)
Cardiology Office Note:    Date:  02/19/2017   ID:  Lindsey Frost, DOB April 28, 1941, MRN [id number]  PCP:  Lindsey Dandy, NP  Cardiologist:  Lindsey More, MD    Referring MD: Lindsey Dandy, NP    ASSESSMENT:    1. Coronary artery disease involving native coronary artery of native heart with angina pectoris (Lindsey Frost)   2. Chronic diastolic CHF (congestive heart failure) (Lindsey Frost)   3. Hypertensive heart failure (Lindsey Frost)   4. Mixed hyperlipidemia    PLAN:    In order of problems listed above:  1. Stable continue treatment with aspirin beta blocker Isordil statin 2. Stable compensated continue current loop diuretic home self management and sodium restriction 3. Stable blood pressure target home generally runs less than 140 and continue current treatment as she was intolerant of hydralazine with itching 4. Stable continue her statin recent lipids requested from her PCP Next appointment: 3 months   Medication Adjustments/Labs and Tests Ordered: Current medicines are reviewed at length with the patient today.  Concerns regarding medicines are outlined above.  No orders of the defined types were placed in this encounter.  No orders of the defined types were placed in this encounter.   Chief Complaint  Patient presents with  . Follow-up    Routine office visit for CAD and heart failure    History of Present Illness:    Lindsey Frost is a 76 y.o. female with a hx of CAD and PCI and Taxus stent to LAD 2006 , Dyslipidemia, HTN iron deficiency anemia and diastolic heart failure   last seen 2 months ago. Compliance with diet, lifestyle and medications: Yes She is pleased with the quality of her life her weight is down 1-2 pounds and is not experiencing edema shortness of breath chest pain palpitation or syncope and her hemoglobin is improving with oral iron therapy. Recent labs requested from her PCP office Past Medical History:  Diagnosis Date  . Acute kidney injury ([place]) 09/04/2015    . Altered awareness, transient 02/08/2012  . Arthritis    OA AND PAIN LEFT KNEE  AND ARTHRITIS IN FINGERS  . Cancer (HCC)    UTERINE CANCER - S/P HYSTERECTOMY  . CHF exacerbation ([place]) 02/08/2012  . Chronic diastolic CHF (congestive heart failure) ([place]) 09/04/2015  . Coronary artery disease    Followed by Dr.Alvilda Frost  . Coronary atherosclerosis of native coronary artery 02/08/2012  . Diabetes mellitus   . Essential hypertension, benign 02/08/2012  . GERD (gastroesophageal reflux disease)   . Hypercalcemia 09/04/2015  . Hyperlipidemia   . Hypertension   . Left hip pain 09/04/2015  . Leukocytosis 09/04/2015  . Mixed hyperlipidemia 02/08/2012  . Myocardial infarction ([place]) JULY 2006   [place]  . SIRS (systemic inflammatory response syndrome) ([place]) 09/04/2015  . Sleep apnea    PT HAS CPAP MASK AND MACHINE AT HOME--BUT DOES NOT USE-COULD NOT TOLERATE  . Type 2 diabetes mellitus with circulatory disorder ([place]) 09/04/2015    Past Surgical History:  Procedure Laterality Date  . ABDOMINAL HYSTERECTOMY  2000   UTERINE CANCER  . BILATERAL CATARACT EXTRACTIONS  2005  . CORONARY ANGIOPLASTY    . LEFT KNEE ARTHROSCOPY  2012  . TOTAL KNEE ARTHROPLASTY  02/05/2012   Procedure: TOTAL KNEE ARTHROPLASTY;  Surgeon: Mauri Pole, MD;  Location: [facility];  Service: Orthopedics;  Laterality: Left;    Current Medications: Current Meds  Medication Sig  . aspirin EC 81 MG tablet  Take 162 mg by mouth every morning.  . Calcium-Vitamin D (CALTRATE 600 PLUS-VIT D PO) Take 1 tablet by mouth every morning.   . Cyanocobalamin (VITAMIN B-12) 5000 MCG SUBL Place 1 mcg under the tongue daily.  . furosemide (LASIX) 20 MG tablet Take 20 mg by mouth daily.  Marland Kitchen glimepiride (AMARYL) 4 MG tablet Take 4 mg by mouth daily with breakfast.   . Iron-FA-B Cmp-C-Biot-Probiotic (FUSION PLUS) CAPS Take 1 capsule by mouth daily.  . isosorbide mononitrate (IMDUR) 60 MG 24 hr tablet Take 60 mg by mouth daily.  Marland Kitchen  losartan-hydrochlorothiazide (HYZAAR) 100-25 MG per tablet Take 1 tablet by mouth daily.  . metFORMIN (GLUCOPHAGE) 1000 MG tablet Take 1,000 mg by mouth 2 (two) times daily with a meal.  . metoprolol tartrate (LOPRESSOR) 50 MG tablet Take 25 mg by mouth 2 (two) times daily.  . Multiple Vitamin (MULTIVITAMIN WITH MINERALS) TABS tablet Take 1 tablet by mouth daily.  . pantoprazole (PROTONIX) 40 MG tablet Take 40 mg by mouth daily.  . pravastatin (PRAVACHOL) 40 MG tablet Take 40 mg by mouth at bedtime.  . triamcinolone cream (KENALOG) 0.1 % apply to affected area ON FACE twice a day for 5 to 7 days     Allergies:   Prednisone and Hydralazine hcl   Social History   Social History  . Marital status: Married    Spouse name: N/A  . Number of children: N/A  . Years of education: N/A   Social History Main Topics  . Smoking status: Never Smoker  . Smokeless tobacco: Never Used  . Alcohol use No  . Drug use: No  . Sexual activity: Not Currently   Other Topics Concern  . None   Social History Narrative  . None     Family History: The patient's family history includes Atrial fibrillation in her unknown relative; Hypertension in her unknown relative. ROS:   Please see the history of present illness.    All other systems reviewed and are negative.  EKGs/Labs/Other Studies Reviewed:    The following studies were reviewed today:  Recent Labs: Hgb 10.3 01/22/17 01/16/2017 hemoglobin 10.3 iron saturation 13% ferritin level low at 7 No results found for requested labs within last 8760 hours.  Recent Lipid Panel No results found for: CHOL, TRIG, HDL, CHOLHDL, VLDL, LDLCALC, LDLDIRECT  Physical Exam:    VS:  BP (!) 158/78   Pulse 74   Ht 5\' 1"  (1.549 m)   Wt 180 lb 6.4 oz (81.8 kg)   SpO2 96%   BMI 34.09 kg/m     Wt Readings from Last 3 Encounters:  02/19/17 180 lb 6.4 oz (81.8 kg)  09/04/15 177 lb (80.3 kg)  02/09/12 177 lb 11.1 oz (80.6 kg)     GEN:  Well nourished, well  developed in no acute distress HEENT: Normal NECK: No JVD; No carotid bruits LYMPHATICS: No lymphadenopathy CARDIAC: RRR, no murmurs, rubs, gallops RESPIRATORY:  Clear to auscultation without rales, wheezing or rhonchi  ABDOMEN: Soft, non-tender, non-distended MUSCULOSKELETAL:  No edema; No deformity  SKIN: Warm and dry NEUROLOGIC:  Alert and oriented x 3 PSYCHIATRIC:  Normal affect    Signed, Lindsey More, MD  02/19/2017 9:22 AM    [facility]

## 2017-02-19 NOTE — Patient Instructions (Addendum)
Medication Instructions:  Your physician recommends that you continue on your current medications as directed. Please refer to the Current Medication list given to you today.   Labwork: None.  Testing/Procedures: None.  Follow-Up: Your physician wants you to follow-up in: 3 months. You will receive a reminder letter in the mail two months in advance. If you don't receive a letter, please call our office to schedule the follow-up appointment.   Any Other Special Instructions Will Be Listed Below (If Applicable).     If you need a refill on your cardiac medications before your next appointment, please call your pharmacy.       KNOW YOUR HEART FAILURE ZONES  Green Zone (Your Goal):  . No shortness of breath or trouble bleeding . No weight gain of more than 3 pounds in one day or 5 pounds in a week . No swelling in your ankles, feet, stomach, or hands . No chest discomfort, heaviness or pain  Yellow Zone (Call the Doctor to get help!): Having 1 or more of the following: . Weight gain of 3 pounds in 1 day or 5 pounds in 1 week . More swelling of your feet, ankles, stomach, or hands . It is harder to breath when lying down. You need to sit up . Chest discomfort, heaviness, or pain . You feel more tired or have less energy than normal . New or worsening dizziness . Dry hacking cough . You feel uneasy and you know something is not right  Red Zone (Call 911-EMERGENCY): . Struggling to breathe. This does not go away when you sit up. . Stronger and more regular amounts of chest discomfort . New confusion or can't think clearly . Fainting or near fainting   Resources form the American heart Association: http://www.heart.org/HEARTORG/Conditions/HeartFailure/Rise-Above-Heart-Failure-Toolkit_UCM_492391_SubHomePage.jsp 

## 2017-03-13 DIAGNOSIS — E1165 Type 2 diabetes mellitus with hyperglycemia: Secondary | ICD-10-CM | POA: Diagnosis not present

## 2017-03-13 DIAGNOSIS — E782 Mixed hyperlipidemia: Secondary | ICD-10-CM | POA: Diagnosis not present

## 2017-03-19 DIAGNOSIS — I10 Essential (primary) hypertension: Secondary | ICD-10-CM | POA: Diagnosis not present

## 2017-03-19 DIAGNOSIS — Z6833 Body mass index (BMI) 33.0-33.9, adult: Secondary | ICD-10-CM | POA: Diagnosis not present

## 2017-03-19 DIAGNOSIS — D509 Iron deficiency anemia, unspecified: Secondary | ICD-10-CM | POA: Diagnosis not present

## 2017-03-20 DIAGNOSIS — E1165 Type 2 diabetes mellitus with hyperglycemia: Secondary | ICD-10-CM | POA: Diagnosis not present

## 2017-03-20 DIAGNOSIS — E782 Mixed hyperlipidemia: Secondary | ICD-10-CM | POA: Diagnosis not present

## 2017-03-20 DIAGNOSIS — I10 Essential (primary) hypertension: Secondary | ICD-10-CM | POA: Diagnosis not present

## 2017-03-20 DIAGNOSIS — E1159 Type 2 diabetes mellitus with other circulatory complications: Secondary | ICD-10-CM | POA: Diagnosis not present

## 2017-04-16 DIAGNOSIS — R0600 Dyspnea, unspecified: Secondary | ICD-10-CM | POA: Diagnosis not present

## 2017-04-16 DIAGNOSIS — Z139 Encounter for screening, unspecified: Secondary | ICD-10-CM | POA: Diagnosis not present

## 2017-04-16 DIAGNOSIS — I509 Heart failure, unspecified: Secondary | ICD-10-CM | POA: Diagnosis not present

## 2017-04-16 DIAGNOSIS — R0602 Shortness of breath: Secondary | ICD-10-CM | POA: Diagnosis not present

## 2017-04-16 DIAGNOSIS — Z9181 History of falling: Secondary | ICD-10-CM | POA: Diagnosis not present

## 2017-04-16 DIAGNOSIS — D509 Iron deficiency anemia, unspecified: Secondary | ICD-10-CM | POA: Diagnosis not present

## 2017-04-16 DIAGNOSIS — R531 Weakness: Secondary | ICD-10-CM | POA: Diagnosis not present

## 2017-05-01 DIAGNOSIS — D509 Iron deficiency anemia, unspecified: Secondary | ICD-10-CM | POA: Diagnosis not present

## 2017-05-08 DIAGNOSIS — D509 Iron deficiency anemia, unspecified: Secondary | ICD-10-CM | POA: Diagnosis not present

## 2017-05-11 ENCOUNTER — Other Ambulatory Visit: Payer: Self-pay

## 2017-05-11 MED ORDER — PRAVASTATIN SODIUM 40 MG PO TABS: 40.0000 mg | ORAL_TABLET | Freq: Every day | ORAL | 2 refills | Status: DC

## 2017-05-15 DIAGNOSIS — D509 Iron deficiency anemia, unspecified: Secondary | ICD-10-CM | POA: Diagnosis not present

## 2017-05-17 DIAGNOSIS — Z0000 Encounter for general adult medical examination without abnormal findings: Secondary | ICD-10-CM | POA: Diagnosis not present

## 2017-05-17 DIAGNOSIS — Z9181 History of falling: Secondary | ICD-10-CM | POA: Diagnosis not present

## 2017-05-17 DIAGNOSIS — Z1389 Encounter for screening for other disorder: Secondary | ICD-10-CM | POA: Diagnosis not present

## 2017-05-17 DIAGNOSIS — E785 Hyperlipidemia, unspecified: Secondary | ICD-10-CM | POA: Diagnosis not present

## 2017-05-17 DIAGNOSIS — Z6830 Body mass index (BMI) 30.0-30.9, adult: Secondary | ICD-10-CM | POA: Diagnosis not present

## 2017-05-17 DIAGNOSIS — Z136 Encounter for screening for cardiovascular disorders: Secondary | ICD-10-CM | POA: Diagnosis not present

## 2017-05-17 DIAGNOSIS — E669 Obesity, unspecified: Secondary | ICD-10-CM | POA: Diagnosis not present

## 2017-05-21 ENCOUNTER — Encounter: Payer: Self-pay | Admitting: *Deleted

## 2017-05-21 DIAGNOSIS — D509 Iron deficiency anemia, unspecified: Secondary | ICD-10-CM | POA: Diagnosis not present

## 2017-05-21 DIAGNOSIS — I10 Essential (primary) hypertension: Secondary | ICD-10-CM | POA: Diagnosis not present

## 2017-05-21 DIAGNOSIS — Z6834 Body mass index (BMI) 34.0-34.9, adult: Secondary | ICD-10-CM | POA: Diagnosis not present

## 2017-06-01 NOTE — Progress Notes (Signed)
Cardiology Office Note:    Date:  06/04/2017   ID:  Lindsey Frost, DOB Jul 13, 1941, MRN [id number]  PCP:  Lindsey Dandy, NP  Cardiologist:  Lindsey More, MD    Referring MD: Lindsey Dandy, NP    ASSESSMENT:    1. Chronic diastolic CHF (congestive heart failure) ([place])   2. Coronary artery disease involving native coronary artery of native heart with angina pectoris ([place])   3. Hypertensive heart failure ([place])   4. Frequent PVCs   5. Mixed dyslipidemia   6. Anemia, unspecified type    PLAN:    In order of problems listed above:  1. Stable continue current loop diuretic and sodium restriction 2. Stable continue current medical treatment, at this time I do not feel she requires an ischemia evaluation. 3. Stable blood pressure target is beta-blocker and ARB 4. Stable asymptomatic continue beta-blocker 5. Stable continue her statin checked with her PCP 6. Stable receiving IV iron from hematology   Next appointment: 3 months   Medication Adjustments/Labs and Tests Ordered: Current medicines are reviewed at length with the patient today.  Concerns regarding medicines are outlined above.  No orders of the defined types were placed in this encounter.  No orders of the defined types were placed in this encounter.   Chief Complaint  Patient presents with  . Congestive Heart Failure  . Coronary Artery Disease  . Hypertension  . Hyperlipidemia    History of Present Illness:    Lindsey Frost is a 76 y.o. female with a hx of CAD and PCI and Taxus stent to LAD 2006 , Dyslipidemia, HTN iron deficiency anemia and diastolic heart failure last seen July 2018. Recently received IV iron at [facility]. Compliance with diet, lifestyle and medications: Yes Past Medical History:  Diagnosis Date  . Acute kidney injury ([place]) 09/04/2015  . Altered awareness, transient 02/08/2012  . Arthritis    OA AND PAIN LEFT KNEE  AND ARTHRITIS IN FINGERS  . Cancer (HCC)    UTERINE CANCER - S/P  HYSTERECTOMY  . CHF exacerbation ([place]) 02/08/2012  . Chronic diastolic CHF (congestive heart failure) ([place]) 09/04/2015  . Coronary artery disease    Followed by Dr.Emmilee Frost  . Coronary atherosclerosis of native coronary artery 02/08/2012  . Diabetes mellitus   . Essential hypertension, benign 02/08/2012  . GERD (gastroesophageal reflux disease)   . Hypercalcemia 09/04/2015  . Hyperlipidemia   . Hypertension   . Left hip pain 09/04/2015  . Leukocytosis 09/04/2015  . Mixed hyperlipidemia 02/08/2012  . Myocardial infarction ([place]) JULY 2006   [place]  . SIRS (systemic inflammatory response syndrome) ([place]) 09/04/2015  . Sleep apnea    PT HAS CPAP MASK AND MACHINE AT HOME--BUT DOES NOT USE-COULD NOT TOLERATE  . Type 2 diabetes mellitus with circulatory disorder ([place]) 09/04/2015    Past Surgical History:  Procedure Laterality Date  . ABDOMINAL HYSTERECTOMY  2000   UTERINE CANCER  . BILATERAL CATARACT EXTRACTIONS  2005  . CORONARY ANGIOPLASTY    . LEFT KNEE ARTHROSCOPY  2012  . TOTAL KNEE ARTHROPLASTY  02/05/2012   Procedure: TOTAL KNEE ARTHROPLASTY;  Surgeon: Lindsey Pole, MD;  Location: [facility];  Service: Orthopedics;  Laterality: Left;    Current Medications: Current Meds  Medication Sig  . acetaminophen (TYLENOL) 500 MG tablet Take 1,000 mg by mouth.  Marland Kitchen aspirin EC 81 MG tablet Take 162 mg by mouth every morning.  . Calcium-Vitamin D (CALTRATE 600 PLUS-VIT D  PO) Take 1 tablet by mouth every morning.   . Cyanocobalamin (VITAMIN B-12) 5000 MCG SUBL Place 1 mcg under the tongue daily.  . furosemide (LASIX) 20 MG tablet Take 20 mg by mouth every other day.   Marland Kitchen glimepiride (AMARYL) 4 MG tablet Take 4 mg by mouth daily with breakfast.   . Iron-FA-B Cmp-C-Biot-Probiotic (FUSION PLUS) CAPS Take 1 capsule by mouth daily.  . isosorbide mononitrate (IMDUR) 60 MG 24 hr tablet Take 60 mg by mouth daily.  Marland Kitchen losartan-hydrochlorothiazide (HYZAAR) 100-25 MG per tablet Take 1 tablet  by mouth daily.  . metFORMIN (GLUCOPHAGE) 1000 MG tablet Take 1,000 mg by mouth 2 (two) times daily with a meal.  . metoprolol tartrate (LOPRESSOR) 50 MG tablet Take 12.5 mg by mouth 2 (two) times daily.  . Multiple Vitamin (MULTIVITAMIN WITH MINERALS) TABS tablet Take 1 tablet by mouth daily.  . nitroGLYCERIN (NITROSTAT) 0.4 MG SL tablet Place 1 tablet (0.4 mg total) under the tongue every 5 (five) minutes as needed for chest pain.  [name] DELICA LANCETS FINE MISC   . pantoprazole (PROTONIX) 40 MG tablet Take 40 mg by mouth daily.  . pravastatin (PRAVACHOL) 40 MG tablet Take 1 tablet (40 mg total) by mouth at bedtime.  . sitaGLIPtin (JANUVIA) 50 MG tablet Take 1 tablet once a day for diabetes  . triamcinolone cream (KENALOG) 0.1 % apply to affected area ON FACE twice a day for 5 to 7 days     Allergies:   Prednisone and Hydralazine hcl   Social History   Social History  . Marital status: Married    Spouse name: N/A  . Number of children: N/A  . Years of education: N/A   Social History Main Topics  . Smoking status: Never Smoker  . Smokeless tobacco: Never Used  . Alcohol use No  . Drug use: No  . Sexual activity: Not Currently   Other Topics Concern  . None   Social History Narrative  . None     Family History: The patient's family history includes Atrial fibrillation in her unknown relative; Hypertension in her unknown relative. ROS:   Please see the history of present illness.    All other systems reviewed and are negative.  EKGs/Labs/Other Studies Reviewed:    The following studies were reviewed today  Recent Labs: 12/25/16 CMP normal, BNP 275 No results found for requested labs within last 8760 hours.  Recent Lipid Panel 03/13/17: Chol131, HDL 38 LDL 80 No results found for: CHOL, TRIG, HDL, CHOLHDL, VLDL, LDLCALC, LDLDIRECT  Physical Exam:    VS:  BP 140/80 (BP Location: Right Arm, Patient Position: Sitting, Cuff Size: Normal)   Pulse 63   Ht 5\' 1"   (1.549 m)   Wt 176 lb (79.8 kg)   SpO2 96%   BMI 33.25 kg/m     Wt Readings from Last 3 Encounters:  06/04/17 176 lb (79.8 kg)  02/19/17 180 lb 6.4 oz (81.8 kg)  09/04/15 177 lb (80.3 kg)     GEN:  Well nourished, well developed in no acute distress HEENT: Normal NECK: No JVD; No carotid bruits LYMPHATICS: No lymphadenopathy CARDIAC: RRR, no murmurs, rubs, gallops RESPIRATORY:  Clear to auscultation without rales, wheezing or rhonchi  ABDOMEN: Soft, non-tender, non-distended MUSCULOSKELETAL:  No edema; No deformity  SKIN: Warm and dry NEUROLOGIC:  Alert and oriented x 3 PSYCHIATRIC:  Normal affect    Signed, Lindsey More, MD  06/04/2017 12:58 PM    [facility]

## 2017-06-04 ENCOUNTER — Ambulatory Visit (INDEPENDENT_AMBULATORY_CARE_PROVIDER_SITE_OTHER): Payer: PPO | Admitting: Cardiology

## 2017-06-04 ENCOUNTER — Encounter: Payer: Self-pay | Admitting: Cardiology

## 2017-06-04 VITALS — BP 140/80 | HR 63 | Ht 61.0 in | Wt 176.0 lb

## 2017-06-04 DIAGNOSIS — I25119 Atherosclerotic heart disease of native coronary artery with unspecified angina pectoris: Secondary | ICD-10-CM

## 2017-06-04 DIAGNOSIS — D649 Anemia, unspecified: Secondary | ICD-10-CM | POA: Diagnosis not present

## 2017-06-04 DIAGNOSIS — I493 Ventricular premature depolarization: Secondary | ICD-10-CM | POA: Diagnosis not present

## 2017-06-04 DIAGNOSIS — I110 Hypertensive heart disease with heart failure: Secondary | ICD-10-CM | POA: Diagnosis not present

## 2017-06-04 DIAGNOSIS — E782 Mixed hyperlipidemia: Secondary | ICD-10-CM | POA: Diagnosis not present

## 2017-06-04 DIAGNOSIS — I5032 Chronic diastolic (congestive) heart failure: Secondary | ICD-10-CM | POA: Diagnosis not present

## 2017-06-04 NOTE — Patient Instructions (Signed)

## 2017-06-05 DIAGNOSIS — D509 Iron deficiency anemia, unspecified: Secondary | ICD-10-CM | POA: Diagnosis not present

## 2017-06-11 DIAGNOSIS — E782 Mixed hyperlipidemia: Secondary | ICD-10-CM | POA: Diagnosis not present

## 2017-06-11 DIAGNOSIS — E1165 Type 2 diabetes mellitus with hyperglycemia: Secondary | ICD-10-CM | POA: Diagnosis not present

## 2017-06-14 DIAGNOSIS — Z779 Other contact with and (suspected) exposures hazardous to health: Secondary | ICD-10-CM | POA: Diagnosis not present

## 2017-06-14 DIAGNOSIS — Z124 Encounter for screening for malignant neoplasm of cervix: Secondary | ICD-10-CM | POA: Diagnosis not present

## 2017-06-14 DIAGNOSIS — M8588 Other specified disorders of bone density and structure, other site: Secondary | ICD-10-CM | POA: Diagnosis not present

## 2017-06-14 DIAGNOSIS — Z1231 Encounter for screening mammogram for malignant neoplasm of breast: Secondary | ICD-10-CM | POA: Diagnosis not present

## 2017-06-14 DIAGNOSIS — Z6834 Body mass index (BMI) 34.0-34.9, adult: Secondary | ICD-10-CM | POA: Diagnosis not present

## 2017-06-14 DIAGNOSIS — N958 Other specified menopausal and perimenopausal disorders: Secondary | ICD-10-CM | POA: Diagnosis not present

## 2017-06-18 DIAGNOSIS — E782 Mixed hyperlipidemia: Secondary | ICD-10-CM | POA: Diagnosis not present

## 2017-06-18 DIAGNOSIS — E1159 Type 2 diabetes mellitus with other circulatory complications: Secondary | ICD-10-CM | POA: Diagnosis not present

## 2017-06-18 DIAGNOSIS — E1165 Type 2 diabetes mellitus with hyperglycemia: Secondary | ICD-10-CM | POA: Diagnosis not present

## 2017-06-18 DIAGNOSIS — I10 Essential (primary) hypertension: Secondary | ICD-10-CM | POA: Diagnosis not present

## 2017-06-22 DIAGNOSIS — D225 Melanocytic nevi of trunk: Secondary | ICD-10-CM | POA: Diagnosis not present

## 2017-06-22 DIAGNOSIS — L918 Other hypertrophic disorders of the skin: Secondary | ICD-10-CM | POA: Diagnosis not present

## 2017-06-22 DIAGNOSIS — L821 Other seborrheic keratosis: Secondary | ICD-10-CM | POA: Diagnosis not present

## 2017-06-22 DIAGNOSIS — D1801 Hemangioma of skin and subcutaneous tissue: Secondary | ICD-10-CM | POA: Diagnosis not present

## 2017-06-22 DIAGNOSIS — L718 Other rosacea: Secondary | ICD-10-CM | POA: Diagnosis not present

## 2017-06-22 DIAGNOSIS — D2239 Melanocytic nevi of other parts of face: Secondary | ICD-10-CM | POA: Diagnosis not present

## 2017-07-23 DIAGNOSIS — D509 Iron deficiency anemia, unspecified: Secondary | ICD-10-CM | POA: Diagnosis not present

## 2017-07-23 DIAGNOSIS — R51 Headache: Secondary | ICD-10-CM | POA: Diagnosis not present

## 2017-07-23 DIAGNOSIS — M19041 Primary osteoarthritis, right hand: Secondary | ICD-10-CM | POA: Diagnosis not present

## 2017-07-23 DIAGNOSIS — Z139 Encounter for screening, unspecified: Secondary | ICD-10-CM | POA: Diagnosis not present

## 2017-08-13 DIAGNOSIS — J069 Acute upper respiratory infection, unspecified: Secondary | ICD-10-CM | POA: Diagnosis not present

## 2017-08-23 DIAGNOSIS — E119 Type 2 diabetes mellitus without complications: Secondary | ICD-10-CM | POA: Diagnosis not present

## 2017-08-23 DIAGNOSIS — H5213 Myopia, bilateral: Secondary | ICD-10-CM | POA: Diagnosis not present

## 2017-08-23 DIAGNOSIS — Z7984 Long term (current) use of oral hypoglycemic drugs: Secondary | ICD-10-CM | POA: Diagnosis not present

## 2017-08-23 DIAGNOSIS — H52203 Unspecified astigmatism, bilateral: Secondary | ICD-10-CM | POA: Diagnosis not present

## 2017-08-23 DIAGNOSIS — Z961 Presence of intraocular lens: Secondary | ICD-10-CM | POA: Diagnosis not present

## 2017-08-23 DIAGNOSIS — H524 Presbyopia: Secondary | ICD-10-CM | POA: Diagnosis not present

## 2017-09-03 DIAGNOSIS — E782 Mixed hyperlipidemia: Secondary | ICD-10-CM | POA: Diagnosis not present

## 2017-09-03 DIAGNOSIS — E1165 Type 2 diabetes mellitus with hyperglycemia: Secondary | ICD-10-CM | POA: Diagnosis not present

## 2017-09-10 DIAGNOSIS — J069 Acute upper respiratory infection, unspecified: Secondary | ICD-10-CM | POA: Diagnosis not present

## 2017-09-10 DIAGNOSIS — H1033 Unspecified acute conjunctivitis, bilateral: Secondary | ICD-10-CM | POA: Diagnosis not present

## 2017-09-17 DIAGNOSIS — G4730 Sleep apnea, unspecified: Secondary | ICD-10-CM | POA: Diagnosis not present

## 2017-09-17 DIAGNOSIS — E1159 Type 2 diabetes mellitus with other circulatory complications: Secondary | ICD-10-CM | POA: Diagnosis not present

## 2017-09-17 DIAGNOSIS — E782 Mixed hyperlipidemia: Secondary | ICD-10-CM | POA: Diagnosis not present

## 2017-09-17 DIAGNOSIS — I119 Hypertensive heart disease without heart failure: Secondary | ICD-10-CM | POA: Diagnosis not present

## 2017-09-17 DIAGNOSIS — I2510 Atherosclerotic heart disease of native coronary artery without angina pectoris: Secondary | ICD-10-CM | POA: Diagnosis not present

## 2017-09-17 DIAGNOSIS — E1165 Type 2 diabetes mellitus with hyperglycemia: Secondary | ICD-10-CM | POA: Diagnosis not present

## 2017-09-17 DIAGNOSIS — Z7984 Long term (current) use of oral hypoglycemic drugs: Secondary | ICD-10-CM | POA: Diagnosis not present

## 2017-09-17 NOTE — Progress Notes (Signed)
Cardiology Office Note:    Date:  09/18/2017   ID:  Lindsey Frost, [place] 1940-12-04, MRN [id number]  PCP:  Lowella Dandy, NP  Cardiologist:  Shirlee More, MD    Referring MD: Lowella Dandy, NP    ASSESSMENT:    1. Coronary artery disease involving native coronary artery of native heart with angina pectoris ([place])   2. Chronic diastolic CHF (congestive heart failure) ([place])   3. Hypertensive heart failure ([place])   4. Mixed dyslipidemia    PLAN:    In order of problems listed above:  1. Stable continue medical therapy aspirin oral nitrate beta-blocker and statin. 2. Stable blood pressure target continue treatment including ARB diuretic 3. Stable continue with statin, recent labs requested from PCP to review renal function and LDL cholesterol   Next appointment: 6 months   Medication Adjustments/Labs and Tests Ordered: Current medicines are reviewed at length with the patient today.  Concerns regarding medicines are outlined above.  Orders Placed This Encounter  Procedures  . EKG 12-Lead   No orders of the defined types were placed in this encounter.   Chief Complaint  Patient presents with  . Follow-up  . Congestive Heart Failure    History of Present Illness:    Lindsey Frost is a 77 y.o. female with a hx of CAD and PCI and Taxus stent to LAD 2006 , Dyslipidemia, HTN iron deficiency anemia and diastolic heart failure  last seen 06/04/18.Marland Kitchen Compliance with diet, lifestyle and medications: Yes She had a recent upper respiratory tract infection treated with antibiotics still feels fatigued but has had no chest pain shortness of breath palpitations syncope or TIA.  She relates she no longer requires iron therapy and has plans to follow-up with hematology. Past Medical History:  Diagnosis Date  . Acute kidney injury ([place]) 09/04/2015  . Altered awareness, transient 02/08/2012  . Arthritis    OA AND PAIN LEFT KNEE  AND ARTHRITIS IN FINGERS  . Cancer (HCC)    UTERINE  CANCER - S/P HYSTERECTOMY  . CHF exacerbation ([place]) 02/08/2012  . Chronic diastolic CHF (congestive heart failure) ([place]) 09/04/2015  . Coronary artery disease    Followed by Dr.Munley  . Coronary atherosclerosis of native coronary artery 02/08/2012  . Diabetes mellitus   . Essential hypertension, benign 02/08/2012  . GERD (gastroesophageal reflux disease)   . Hypercalcemia 09/04/2015  . Hyperlipidemia   . Hypertension   . Left hip pain 09/04/2015  . Leukocytosis 09/04/2015  . Mixed hyperlipidemia 02/08/2012  . Myocardial infarction ([place]) JULY 2006   [place]  . SIRS (systemic inflammatory response syndrome) ([place]) 09/04/2015  . Sleep apnea    PT HAS CPAP MASK AND MACHINE AT HOME--BUT DOES NOT USE-COULD NOT TOLERATE  . Type 2 diabetes mellitus with circulatory disorder ([place]) 09/04/2015    Past Surgical History:  Procedure Laterality Date  . ABDOMINAL HYSTERECTOMY  2000   UTERINE CANCER  . BILATERAL CATARACT EXTRACTIONS  2005  . CORONARY ANGIOPLASTY    . LEFT KNEE ARTHROSCOPY  2012  . TOTAL KNEE ARTHROPLASTY  02/05/2012   Procedure: TOTAL KNEE ARTHROPLASTY;  Surgeon: Mauri Pole, MD;  Location: [facility];  Service: Orthopedics;  Laterality: Left;    Current Medications: Current Meds  Medication Sig  . acetaminophen (TYLENOL) 500 MG tablet Take 1,000 mg by mouth.  Marland Kitchen aspirin EC 81 MG tablet Take 162 mg by mouth every morning.  . Calcium-Vitamin D (CALTRATE 600 PLUS-VIT D PO)  Take 1 tablet by mouth every morning.   . Cyanocobalamin (VITAMIN B-12) 5000 MCG SUBL Place 1 mcg under the tongue daily.  . furosemide (LASIX) 20 MG tablet Take 20 mg by mouth every other day.   Marland Kitchen glimepiride (AMARYL) 4 MG tablet Take 4 mg by mouth daily with breakfast.   . isosorbide mononitrate (IMDUR) 60 MG 24 hr tablet Take 60 mg by mouth daily.  Marland Kitchen losartan-hydrochlorothiazide (HYZAAR) 100-25 MG per tablet Take 1 tablet by mouth daily.  . metFORMIN (GLUCOPHAGE) 1000 MG tablet Take 1,000 mg  by mouth 2 (two) times daily with a meal.  . metoprolol tartrate (LOPRESSOR) 50 MG tablet Take 12.5 mg by mouth 2 (two) times daily.  . Multiple Vitamin (MULTIVITAMIN WITH MINERALS) TABS tablet Take 1 tablet by mouth daily.  . nitroGLYCERIN (NITROSTAT) 0.4 MG SL tablet Place 1 tablet (0.4 mg total) under the tongue every 5 (five) minutes as needed for chest pain.  [name] DELICA LANCETS FINE MISC   . pantoprazole (PROTONIX) 40 MG tablet Take 40 mg by mouth daily.  . pravastatin (PRAVACHOL) 40 MG tablet Take 1 tablet (40 mg total) by mouth at bedtime.  . sitaGLIPtin (JANUVIA) 50 MG tablet Take 1 tablet once a day for diabetes  . triamcinolone cream (KENALOG) 0.1 % apply to affected area ON FACE twice a day for 5 to 7 days     Allergies:   Prednisone and Hydralazine hcl   Social History   Socioeconomic History  . Marital status: Married    Spouse name: None  . Number of children: None  . Years of education: None  . Highest education level: None  Social Needs  . Financial resource strain: None  . Food insecurity - worry: None  . Food insecurity - inability: None  . Transportation needs - medical: None  . Transportation needs - non-medical: None  Occupational History  . None  Tobacco Use  . Smoking status: Never Smoker  . Smokeless tobacco: Never Used  Substance and Sexual Activity  . Alcohol use: No  . Drug use: No  . Sexual activity: Not Currently  Other Topics Concern  . None  Social History Narrative  . None     Family History: The patient's family history includes Atrial fibrillation in her unknown relative; Hypertension in her unknown relative. ROS:   Please see the history of present illness.    All other systems reviewed and are negative.  EKGs/Labs/Other Studies Reviewed:    The following studies were reviewed today:  EKG:  EKG ordered today.  The ekg ordered today demonstrates sinus rhythm LVH repolarization  Recent Labs: No results found for requested  labs within last 8760 hours.  Recent Lipid Panel No results found for: CHOL, TRIG, HDL, CHOLHDL, VLDL, LDLCALC, LDLDIRECT  Physical Exam:    VS:  BP 128/72 (BP Location: Right Arm, Patient Position: Sitting, Cuff Size: Normal)   Pulse 91   Ht 5\' 1"  (1.549 m)   Wt 167 lb (75.8 kg)   SpO2 98%   BMI 31.55 kg/m     Wt Readings from Last 3 Encounters:  09/18/17 167 lb (75.8 kg)  06/04/17 176 lb (79.8 kg)  02/19/17 180 lb 6.4 oz (81.8 kg)     GEN:  Well nourished, well developed in no acute distress HEENT: Normal NECK: No JVD; No carotid bruits LYMPHATICS: No lymphadenopathy CARDIAC: RRR, no murmurs, rubs, gallops RESPIRATORY:  Clear to auscultation without rales, wheezing or rhonchi  ABDOMEN: Soft, non-tender,  non-distended MUSCULOSKELETAL:  No edema; No deformity  SKIN: Warm and dry NEUROLOGIC:  Alert and oriented x 3 PSYCHIATRIC:  Normal affect    Signed, Shirlee More, MD  09/18/2017 12:36 PM    [facility]

## 2017-09-18 ENCOUNTER — Encounter: Payer: Self-pay | Admitting: Cardiology

## 2017-09-18 ENCOUNTER — Ambulatory Visit: Payer: Medicare HMO | Admitting: Cardiology

## 2017-09-18 VITALS — BP 128/72 | HR 91 | Ht 61.0 in | Wt 167.0 lb

## 2017-09-18 DIAGNOSIS — E782 Mixed hyperlipidemia: Secondary | ICD-10-CM

## 2017-09-18 DIAGNOSIS — I110 Hypertensive heart disease with heart failure: Secondary | ICD-10-CM

## 2017-09-18 DIAGNOSIS — I5032 Chronic diastolic (congestive) heart failure: Secondary | ICD-10-CM

## 2017-09-18 DIAGNOSIS — I25119 Atherosclerotic heart disease of native coronary artery with unspecified angina pectoris: Secondary | ICD-10-CM | POA: Diagnosis not present

## 2017-09-18 NOTE — Patient Instructions (Signed)

## 2017-10-02 DIAGNOSIS — H938X1 Other specified disorders of right ear: Secondary | ICD-10-CM | POA: Diagnosis not present

## 2017-10-02 DIAGNOSIS — J069 Acute upper respiratory infection, unspecified: Secondary | ICD-10-CM | POA: Diagnosis not present

## 2017-10-29 DIAGNOSIS — D509 Iron deficiency anemia, unspecified: Secondary | ICD-10-CM | POA: Diagnosis not present

## 2017-12-03 DIAGNOSIS — E1165 Type 2 diabetes mellitus with hyperglycemia: Secondary | ICD-10-CM | POA: Diagnosis not present

## 2017-12-03 DIAGNOSIS — E782 Mixed hyperlipidemia: Secondary | ICD-10-CM | POA: Diagnosis not present

## 2017-12-04 DIAGNOSIS — E1159 Type 2 diabetes mellitus with other circulatory complications: Secondary | ICD-10-CM | POA: Diagnosis not present

## 2017-12-04 DIAGNOSIS — Z1331 Encounter for screening for depression: Secondary | ICD-10-CM | POA: Diagnosis not present

## 2017-12-04 DIAGNOSIS — D509 Iron deficiency anemia, unspecified: Secondary | ICD-10-CM | POA: Diagnosis not present

## 2017-12-04 DIAGNOSIS — I10 Essential (primary) hypertension: Secondary | ICD-10-CM | POA: Diagnosis not present

## 2017-12-04 DIAGNOSIS — H9121 Sudden idiopathic hearing loss, right ear: Secondary | ICD-10-CM | POA: Diagnosis not present

## 2017-12-04 DIAGNOSIS — Z1389 Encounter for screening for other disorder: Secondary | ICD-10-CM | POA: Diagnosis not present

## 2017-12-04 DIAGNOSIS — I509 Heart failure, unspecified: Secondary | ICD-10-CM | POA: Diagnosis not present

## 2017-12-10 DIAGNOSIS — E782 Mixed hyperlipidemia: Secondary | ICD-10-CM | POA: Diagnosis not present

## 2017-12-10 DIAGNOSIS — E669 Obesity, unspecified: Secondary | ICD-10-CM | POA: Diagnosis not present

## 2017-12-10 DIAGNOSIS — I119 Hypertensive heart disease without heart failure: Secondary | ICD-10-CM | POA: Diagnosis not present

## 2017-12-10 DIAGNOSIS — I2510 Atherosclerotic heart disease of native coronary artery without angina pectoris: Secondary | ICD-10-CM | POA: Diagnosis not present

## 2017-12-10 DIAGNOSIS — G4730 Sleep apnea, unspecified: Secondary | ICD-10-CM | POA: Diagnosis not present

## 2017-12-10 DIAGNOSIS — H9121 Sudden idiopathic hearing loss, right ear: Secondary | ICD-10-CM | POA: Diagnosis not present

## 2017-12-10 DIAGNOSIS — E1159 Type 2 diabetes mellitus with other circulatory complications: Secondary | ICD-10-CM | POA: Diagnosis not present

## 2017-12-10 DIAGNOSIS — Z6834 Body mass index (BMI) 34.0-34.9, adult: Secondary | ICD-10-CM | POA: Diagnosis not present

## 2017-12-10 DIAGNOSIS — H7491 Unspecified disorder of right middle ear and mastoid: Secondary | ICD-10-CM | POA: Diagnosis not present

## 2017-12-10 DIAGNOSIS — E1165 Type 2 diabetes mellitus with hyperglycemia: Secondary | ICD-10-CM | POA: Diagnosis not present

## 2018-01-01 DIAGNOSIS — H90A31 Mixed conductive and sensorineural hearing loss, unilateral, right ear with restricted hearing on the contralateral side: Secondary | ICD-10-CM | POA: Diagnosis not present

## 2018-01-01 DIAGNOSIS — H6501 Acute serous otitis media, right ear: Secondary | ICD-10-CM | POA: Diagnosis not present

## 2018-01-01 DIAGNOSIS — H6981 Other specified disorders of Eustachian tube, right ear: Secondary | ICD-10-CM

## 2018-01-01 DIAGNOSIS — H6991 Unspecified Eustachian tube disorder, right ear: Secondary | ICD-10-CM

## 2018-01-01 HISTORY — DX: Other specified disorders of eustachian tube, right ear: H69.81

## 2018-01-01 HISTORY — DX: Unspecified eustachian tube disorder, right ear: H69.91

## 2018-01-21 DIAGNOSIS — H90A31 Mixed conductive and sensorineural hearing loss, unilateral, right ear with restricted hearing on the contralateral side: Secondary | ICD-10-CM | POA: Diagnosis not present

## 2018-01-21 DIAGNOSIS — H6981 Other specified disorders of Eustachian tube, right ear: Secondary | ICD-10-CM | POA: Diagnosis not present

## 2018-03-18 DIAGNOSIS — H6521 Chronic serous otitis media, right ear: Secondary | ICD-10-CM | POA: Diagnosis not present

## 2018-03-18 DIAGNOSIS — H6981 Other specified disorders of Eustachian tube, right ear: Secondary | ICD-10-CM | POA: Diagnosis not present

## 2018-03-18 DIAGNOSIS — H90A31 Mixed conductive and sensorineural hearing loss, unilateral, right ear with restricted hearing on the contralateral side: Secondary | ICD-10-CM | POA: Diagnosis not present

## 2018-04-01 DIAGNOSIS — E782 Mixed hyperlipidemia: Secondary | ICD-10-CM | POA: Diagnosis not present

## 2018-04-01 DIAGNOSIS — E1165 Type 2 diabetes mellitus with hyperglycemia: Secondary | ICD-10-CM | POA: Diagnosis not present

## 2018-04-09 DIAGNOSIS — Z6833 Body mass index (BMI) 33.0-33.9, adult: Secondary | ICD-10-CM | POA: Diagnosis not present

## 2018-04-09 DIAGNOSIS — I10 Essential (primary) hypertension: Secondary | ICD-10-CM | POA: Diagnosis not present

## 2018-04-09 DIAGNOSIS — E785 Hyperlipidemia, unspecified: Secondary | ICD-10-CM | POA: Diagnosis not present

## 2018-04-09 DIAGNOSIS — K219 Gastro-esophageal reflux disease without esophagitis: Secondary | ICD-10-CM | POA: Diagnosis not present

## 2018-04-09 DIAGNOSIS — E1159 Type 2 diabetes mellitus with other circulatory complications: Secondary | ICD-10-CM | POA: Diagnosis not present

## 2018-04-09 DIAGNOSIS — H9121 Sudden idiopathic hearing loss, right ear: Secondary | ICD-10-CM | POA: Diagnosis not present

## 2018-04-09 DIAGNOSIS — Z1339 Encounter for screening examination for other mental health and behavioral disorders: Secondary | ICD-10-CM | POA: Diagnosis not present

## 2018-04-15 DIAGNOSIS — E782 Mixed hyperlipidemia: Secondary | ICD-10-CM | POA: Diagnosis not present

## 2018-04-15 DIAGNOSIS — Z794 Long term (current) use of insulin: Secondary | ICD-10-CM | POA: Diagnosis not present

## 2018-04-15 DIAGNOSIS — I10 Essential (primary) hypertension: Secondary | ICD-10-CM | POA: Diagnosis not present

## 2018-04-15 DIAGNOSIS — E1159 Type 2 diabetes mellitus with other circulatory complications: Secondary | ICD-10-CM | POA: Diagnosis not present

## 2018-04-15 DIAGNOSIS — E669 Obesity, unspecified: Secondary | ICD-10-CM | POA: Diagnosis not present

## 2018-04-15 DIAGNOSIS — G4730 Sleep apnea, unspecified: Secondary | ICD-10-CM | POA: Diagnosis not present

## 2018-04-15 DIAGNOSIS — I2510 Atherosclerotic heart disease of native coronary artery without angina pectoris: Secondary | ICD-10-CM | POA: Diagnosis not present

## 2018-05-01 NOTE — Progress Notes (Signed)
Cardiology Office Note:    Date:  05/02/2018   ID:  Lindsey Frost, DOB 1941-01-06, MRN [id number]  PCP:  Lindsey Dandy, NP  Cardiologist:  Lindsey More, MD    Referring MD: Lindsey Dandy, NP    ASSESSMENT:    1. Chronic diastolic CHF (congestive heart failure) ([place])   2. Hypertensive heart failure ([place])   3. Coronary artery disease involving native coronary artery of native heart with angina pectoris ([place])   4. Anemia, unspecified type   5. Mixed hyperlipidemia    PLAN:    In order of problems listed above:  1. Heart failure is stable New York Heart Association class I compensated she has no edema we will continue her minimum diuretic along with sodium restriction at home self-management.  The real precipitant for deterioration was iron deficiency anemia this been corrected. 2. Stable continue current antihypertensives including ARB 3. Stable continue medical treatment New York Heart Association class I she has had no angina and I would not advise an ischemia evaluation at this time 4. Stable iron deficiency has been corrected she continues to follow with hematology. 5. Stable continue statin lipids are ideal   Next appointment: 6 months   Medication Adjustments/Labs and Tests Ordered: Current medicines are reviewed at length with the patient today.  Concerns regarding medicines are outlined above.  No orders of the defined types were placed in this encounter.  No orders of the defined types were placed in this encounter.   Chief Complaint  Patient presents with  . Follow-up    History of Present Illness:      Lindsey Frost is a 77 y.o. female with a hx of CAD and PCI and Taxus stent to LAD 2006 , Dyslipidemia, HTN iron deficiency anemia and diastolic heart failure last seen 09/18/17. Compliance with diet, lifestyle and medications: yes  Her last hemoglobin was in the range of 12 and is due to be seen by hematology in the next 1 to 2 weeks.  With control of  anemia sodium restriction diuretic she has no edema orthopnea and with CAD she has had no angina.  No palpitations syncope or TIA.  She is seeing ENT for hearing loss and was advised myringotomy tubes. Past Medical History:  Diagnosis Date  . Acute kidney injury ([place]) 09/04/2015  . Altered awareness, transient 02/08/2012  . Arthritis    OA AND PAIN LEFT KNEE  AND ARTHRITIS IN FINGERS  . Cancer (HCC)    UTERINE CANCER - S/P HYSTERECTOMY  . CHF exacerbation ([place]) 02/08/2012  . Chronic diastolic CHF (congestive heart failure) ([place]) 09/04/2015  . Coronary artery disease    Followed by Dr.Munley  . Coronary atherosclerosis of native coronary artery 02/08/2012  . Diabetes mellitus   . Essential hypertension, benign 02/08/2012  . GERD (gastroesophageal reflux disease)   . Hypercalcemia 09/04/2015  . Hyperlipidemia   . Hypertension   . Left hip pain 09/04/2015  . Leukocytosis 09/04/2015  . Mixed hyperlipidemia 02/08/2012  . Myocardial infarction ([place]) JULY 2006   [place]  . SIRS (systemic inflammatory response syndrome) ([place]) 09/04/2015  . Sleep apnea    PT HAS CPAP MASK AND MACHINE AT HOME--BUT DOES NOT USE-COULD NOT TOLERATE  . Type 2 diabetes mellitus with circulatory disorder ([place]) 09/04/2015    Past Surgical History:  Procedure Laterality Date  . ABDOMINAL HYSTERECTOMY  2000   UTERINE CANCER  . BILATERAL CATARACT EXTRACTIONS  2005  . CORONARY ANGIOPLASTY    .  LEFT KNEE ARTHROSCOPY  2012  . TOTAL KNEE ARTHROPLASTY  02/05/2012   Procedure: TOTAL KNEE ARTHROPLASTY;  Surgeon: Lindsey Pole, MD;  Location: [facility];  Service: Orthopedics;  Laterality: Left;    Current Medications: Current Meds  Medication Sig  . acetaminophen (TYLENOL) 500 MG tablet Take 1,000 mg by mouth.  Marland Kitchen aspirin EC 81 MG tablet Take 162 mg by mouth every morning.  . Calcium-Vitamin D (CALTRATE 600 PLUS-VIT D PO) Take 1 tablet by mouth every morning.   . Candesartan Cilexetil-HCTZ 32-25 MG TABS  Take 1 tablet once a day for blood pressure and heart  . Cyanocobalamin (VITAMIN B-12) 5000 MCG SUBL Place 1 mcg under the tongue daily.  . furosemide (LASIX) 20 MG tablet Take 20 mg by mouth every other day.   Marland Kitchen glimepiride (AMARYL) 4 MG tablet Take 4 mg by mouth daily with breakfast.   . isosorbide mononitrate (IMDUR) 60 MG 24 hr tablet Take 60 mg by mouth daily.  Marland Kitchen losartan-hydrochlorothiazide (HYZAAR) 100-25 MG per tablet Take 1 tablet by mouth daily.  . metFORMIN (GLUCOPHAGE) 1000 MG tablet Take 1,000 mg by mouth 2 (two) times daily with a meal.  . metoprolol tartrate (LOPRESSOR) 50 MG tablet Take 12.5 mg by mouth 2 (two) times daily.  . Multiple Vitamin (MULTIVITAMIN WITH MINERALS) TABS tablet Take 1 tablet by mouth daily.  . nitroGLYCERIN (NITROSTAT) 0.4 MG SL tablet Place 1 tablet (0.4 mg total) under the tongue every 5 (five) minutes as needed for chest pain.  [name] DELICA LANCETS FINE MISC   . pantoprazole (PROTONIX) 40 MG tablet Take 40 mg by mouth daily.  . pravastatin (PRAVACHOL) 40 MG tablet Take 1 tablet (40 mg total) by mouth at bedtime.  . sitaGLIPtin (JANUVIA) 50 MG tablet Take 1 tablet once a day for diabetes  . triamcinolone cream (KENALOG) 0.1 % apply to affected area ON FACE twice a day for 5 to 7 days     Allergies:   Prednisone and Hydralazine hcl   Social History   Socioeconomic History  . Marital status: Married    Spouse name: Not on file  . Number of children: Not on file  . Years of education: Not on file  . Highest education level: Not on file  Occupational History  . Not on file  Social Needs  . Financial resource strain: Not on file  . Food insecurity:    Worry: Not on file    Inability: Not on file  . Transportation needs:    Medical: Not on file    Non-medical: Not on file  Tobacco Use  . Smoking status: Never Smoker  . Smokeless tobacco: Never Used  Substance and Sexual Activity  . Alcohol use: No  . Drug use: No  . Sexual activity:  Not Currently  Lifestyle  . Physical activity:    Days per week: Not on file    Minutes per session: Not on file  . Stress: Not on file  Relationships  . Social connections:    Talks on phone: Not on file    Gets together: Not on file    Attends religious service: Not on file    Active member of club or organization: Not on file    Attends meetings of clubs or organizations: Not on file    Relationship status: Not on file  Other Topics Concern  . Not on file  Social History Narrative  . Not on file     Family History:  The patient's family history includes Atrial fibrillation in her unknown relative; Hypertension in her unknown relative. ROS:   Please see the history of present illness.    All other systems reviewed and are negative.  EKGs/Labs/Other Studies Reviewed:    The following studies were reviewed today:  Recent Labs: 04/01/2018 CMP was normal cholesterol 126 LDL 70 HDL 34 No results found for requested labs within last 8760 hours.  Recent Lipid Panel No results found for: CHOL, TRIG, HDL, CHOLHDL, VLDL, LDLCALC, LDLDIRECT  Physical Exam:    VS:  BP (!) 160/70 (BP Location: Right Arm, Patient Position: Sitting, Cuff Size: Normal)   Pulse 67   Ht 5\' 1"  (1.549 m)   Wt 172 lb (78 kg)   SpO2 96%   BMI 32.50 kg/m     Wt Readings from Last 3 Encounters:  05/02/18 172 lb (78 kg)  09/18/17 167 lb (75.8 kg)  06/04/17 176 lb (79.8 kg)     GEN:  Well nourished, well developed in no acute distress HEENT: Normal NECK: No JVD; No carotid bruits LYMPHATICS: No lymphadenopathy CARDIAC: RRR, no murmurs, rubs, gallops RESPIRATORY:  Clear to auscultation without rales, wheezing or rhonchi  ABDOMEN: Soft, non-tender, non-distended MUSCULOSKELETAL:  No edema; No deformity  SKIN: Warm and dry NEUROLOGIC:  Alert and oriented x 3 PSYCHIATRIC:  Normal affect    Signed, Lindsey More, MD  05/02/2018 9:46 AM    [place]

## 2018-05-02 ENCOUNTER — Encounter: Payer: Self-pay | Admitting: Cardiology

## 2018-05-02 ENCOUNTER — Ambulatory Visit: Payer: Medicare HMO | Admitting: Cardiology

## 2018-05-02 VITALS — BP 160/70 | HR 67 | Ht 61.0 in | Wt 172.0 lb

## 2018-05-02 DIAGNOSIS — I110 Hypertensive heart disease with heart failure: Secondary | ICD-10-CM | POA: Diagnosis not present

## 2018-05-02 DIAGNOSIS — I25119 Atherosclerotic heart disease of native coronary artery with unspecified angina pectoris: Secondary | ICD-10-CM

## 2018-05-02 DIAGNOSIS — E782 Mixed hyperlipidemia: Secondary | ICD-10-CM

## 2018-05-02 DIAGNOSIS — D649 Anemia, unspecified: Secondary | ICD-10-CM | POA: Diagnosis not present

## 2018-05-02 DIAGNOSIS — I5032 Chronic diastolic (congestive) heart failure: Secondary | ICD-10-CM

## 2018-05-02 NOTE — Patient Instructions (Signed)
Medication Instructions:  Your physician recommends that you continue on your current medications as directed. Please refer to the Current Medication list given to you today.   Labwork: None  Testing/Procedures: None  Follow-Up: Your physician wants you to follow-up in: 6 months. You will receive a reminder letter in the mail two months in advance. If you don't receive a letter, please call our office to schedule the follow-up appointment.   If you need a refill on your cardiac medications before your next appointment, please call your pharmacy.   Thank you for choosing [facility]! Lorn Butcher, RN [phone]    

## 2018-05-20 ENCOUNTER — Encounter: Payer: Self-pay | Admitting: Oncology

## 2018-05-20 DIAGNOSIS — Z862 Personal history of diseases of the blood and blood-forming organs and certain disorders involving the immune mechanism: Secondary | ICD-10-CM | POA: Diagnosis not present

## 2018-05-20 DIAGNOSIS — D508 Other iron deficiency anemias: Secondary | ICD-10-CM | POA: Diagnosis not present

## 2018-07-15 DIAGNOSIS — H90A31 Mixed conductive and sensorineural hearing loss, unilateral, right ear with restricted hearing on the contralateral side: Secondary | ICD-10-CM | POA: Diagnosis not present

## 2018-07-15 DIAGNOSIS — H6981 Other specified disorders of Eustachian tube, right ear: Secondary | ICD-10-CM | POA: Diagnosis not present

## 2018-07-15 DIAGNOSIS — H6521 Chronic serous otitis media, right ear: Secondary | ICD-10-CM | POA: Diagnosis not present

## 2018-07-22 DIAGNOSIS — H6981 Other specified disorders of Eustachian tube, right ear: Secondary | ICD-10-CM | POA: Diagnosis not present

## 2018-07-22 HISTORY — PX: TYMPANOSTOMY TUBE PLACEMENT: SHX32

## 2018-07-23 DIAGNOSIS — L821 Other seborrheic keratosis: Secondary | ICD-10-CM | POA: Diagnosis not present

## 2018-07-23 DIAGNOSIS — L718 Other rosacea: Secondary | ICD-10-CM | POA: Diagnosis not present

## 2018-08-01 ENCOUNTER — Other Ambulatory Visit: Payer: Self-pay | Admitting: Cardiology

## 2018-08-08 DIAGNOSIS — Z6833 Body mass index (BMI) 33.0-33.9, adult: Secondary | ICD-10-CM | POA: Diagnosis not present

## 2018-08-08 DIAGNOSIS — R350 Frequency of micturition: Secondary | ICD-10-CM | POA: Diagnosis not present

## 2018-08-08 DIAGNOSIS — Z01419 Encounter for gynecological examination (general) (routine) without abnormal findings: Secondary | ICD-10-CM | POA: Diagnosis not present

## 2018-08-08 DIAGNOSIS — Z1231 Encounter for screening mammogram for malignant neoplasm of breast: Secondary | ICD-10-CM | POA: Diagnosis not present

## 2018-08-12 DIAGNOSIS — E1165 Type 2 diabetes mellitus with hyperglycemia: Secondary | ICD-10-CM | POA: Diagnosis not present

## 2018-08-12 DIAGNOSIS — D509 Iron deficiency anemia, unspecified: Secondary | ICD-10-CM | POA: Diagnosis not present

## 2018-08-12 DIAGNOSIS — E782 Mixed hyperlipidemia: Secondary | ICD-10-CM | POA: Diagnosis not present

## 2018-08-15 DIAGNOSIS — H9121 Sudden idiopathic hearing loss, right ear: Secondary | ICD-10-CM | POA: Diagnosis not present

## 2018-08-15 DIAGNOSIS — I10 Essential (primary) hypertension: Secondary | ICD-10-CM | POA: Diagnosis not present

## 2018-08-15 DIAGNOSIS — D509 Iron deficiency anemia, unspecified: Secondary | ICD-10-CM | POA: Diagnosis not present

## 2018-08-15 DIAGNOSIS — E1159 Type 2 diabetes mellitus with other circulatory complications: Secondary | ICD-10-CM | POA: Diagnosis not present

## 2018-08-15 DIAGNOSIS — Z6833 Body mass index (BMI) 33.0-33.9, adult: Secondary | ICD-10-CM | POA: Diagnosis not present

## 2018-08-19 ENCOUNTER — Telehealth: Payer: Self-pay | Admitting: Cardiology

## 2018-08-19 DIAGNOSIS — R001 Bradycardia, unspecified: Secondary | ICD-10-CM | POA: Diagnosis not present

## 2018-08-19 DIAGNOSIS — E782 Mixed hyperlipidemia: Secondary | ICD-10-CM | POA: Diagnosis not present

## 2018-08-19 DIAGNOSIS — Z6833 Body mass index (BMI) 33.0-33.9, adult: Secondary | ICD-10-CM | POA: Diagnosis not present

## 2018-08-19 DIAGNOSIS — I2510 Atherosclerotic heart disease of native coronary artery without angina pectoris: Secondary | ICD-10-CM | POA: Diagnosis not present

## 2018-08-19 DIAGNOSIS — E1159 Type 2 diabetes mellitus with other circulatory complications: Secondary | ICD-10-CM | POA: Diagnosis not present

## 2018-08-19 DIAGNOSIS — G4730 Sleep apnea, unspecified: Secondary | ICD-10-CM | POA: Diagnosis not present

## 2018-08-19 DIAGNOSIS — I10 Essential (primary) hypertension: Secondary | ICD-10-CM | POA: Diagnosis not present

## 2018-08-19 DIAGNOSIS — E1165 Type 2 diabetes mellitus with hyperglycemia: Secondary | ICD-10-CM | POA: Diagnosis not present

## 2018-08-19 NOTE — Telephone Encounter (Signed)
Left message to return call on home phone per DPR.

## 2018-08-19 NOTE — Telephone Encounter (Signed)
Pt walked in with heart rate of 40/states her doctor told her just to stop by and see BJM-took her off Metoprolol

## 2018-08-20 NOTE — Telephone Encounter (Signed)
Patient states she saw her endocrinologist yesterday, and her heart rate was in the 40's.  Patient did not have any complaints of lightheadedness or dizziness.  Patient states that her endocrinologist stopped her metoprolol at yesterday's visit and advised that she should follow up with Dr Bettina Gavia.  Patient does not have a blood pressure monitor at home to check her blood pressure or heart rate.    Patient was scheduled to see Dr Bettina Gavia on 08-22-2018.  Patient agreed to plan and verbalized understanding.

## 2018-08-21 NOTE — Progress Notes (Signed)
Cardiology Office Note:    Date:  08/22/2018   ID:  Lindsey Frost, DOB 1941-06-26, MRN [id number]  PCP:  Lindsey Dandy, NP  Cardiologist:  Lindsey More, MD    Referring MD: Lindsey Dandy, NP    ASSESSMENT:    1. Bradycardia with 41-50 beats per minute   2. Coronary artery disease involving native coronary artery of native heart with angina pectoris (Lindsey Frost)   3. Mixed hyperlipidemia   4. Hypertensive heart disease with heart failure (Lindsey Frost)    PLAN:    In order of problems listed above:  1. Transient asymptomatic she is quite hesitant to stop beta-blocker we negotiated should take a half dose once a day and contact me if she is getting heart rates at home less than 50-55 bpm.  I do not think she is an extended monitor and she has normal conduction 2. Stable continue medical therapy 3. Continue her statin 4. Poorly controlled systolics from 1 76-2 70 and I will put her on a small dose of hydralazine at home blood pressure monitoring   Next appointment: 3 months   Medication Adjustments/Labs and Tests Ordered: Current medicines are reviewed at length with the patient today.  Concerns regarding medicines are outlined above.  No orders of the defined types were placed in this encounter.  No orders of the defined types were placed in this encounter.   Chief Complaint  Patient presents with  . Bradycardia    HR < 50 BPM  . Follow-up    for  . Coronary Artery Disease  . Hypertension  . Hyperlipidemia  . Congestive Heart Failure    with anemia    History of Present Illness:    Lindsey Frost is a 78 y.o. female with a hx of bradycardia HR 40-50BPM,CAD and PCI and Taxus stent to LAD 2006 , Dyslipidemia, HTN iron deficiency anemia and diastolic heart failure  last seen 05/02/18. Compliance with diet, lifestyle and medications: yes Past Medical History:  Diagnosis Date  . Acute kidney injury ([place]) 09/04/2015  . Altered awareness, transient 02/08/2012  . Arthritis    OA AND PAIN LEFT KNEE  AND ARTHRITIS IN FINGERS  . Cancer (HCC)    UTERINE CANCER - S/P HYSTERECTOMY  . CHF exacerbation ([place]) 02/08/2012  . Chronic diastolic CHF (congestive heart failure) ([place]) 09/04/2015  . Coronary artery disease    Followed by Lindsey Frost  . Coronary atherosclerosis of native coronary artery 02/08/2012  . Diabetes mellitus   . Essential hypertension, benign 02/08/2012  . GERD (gastroesophageal reflux disease)   . Hypercalcemia 09/04/2015  . Hyperlipidemia   . Hypertension   . Left hip pain 09/04/2015  . Leukocytosis 09/04/2015  . Mixed hyperlipidemia 02/08/2012  . Myocardial infarction ([place]) JULY 2006   [place]  . SIRS (systemic inflammatory response syndrome) ([place]) 09/04/2015  . Sleep apnea    PT HAS CPAP MASK AND MACHINE AT HOME--BUT DOES NOT USE-COULD NOT TOLERATE  . Type 2 diabetes mellitus with circulatory disorder ([place]) 09/04/2015    Past Surgical History:  Procedure Laterality Date  . ABDOMINAL HYSTERECTOMY  2000   UTERINE CANCER  . BILATERAL CATARACT EXTRACTIONS  2005  . CORONARY ANGIOPLASTY    . LEFT KNEE ARTHROSCOPY  2012  . TOTAL KNEE ARTHROPLASTY  02/05/2012   Procedure: TOTAL KNEE ARTHROPLASTY;  Surgeon: Lindsey Pole, MD;  Location: [facility];  Service: Orthopedics;  Laterality: Left;    Current Medications: Current Meds  Medication Sig  .  acetaminophen (TYLENOL) 500 MG tablet Take 1,000 mg by mouth every 4 (four) hours as needed.   Marland Kitchen aspirin EC 81 MG tablet Take 162 mg by mouth every morning.  . Calcium-Vitamin D (CALTRATE 600 PLUS-VIT D PO) Take 1 tablet by mouth every morning.   . Candesartan Cilexetil-HCTZ 32-25 MG TABS Take 1 tablet once a day for blood pressure and heart  . Cyanocobalamin (VITAMIN B-12) 5000 MCG SUBL Place 1 mcg under the tongue daily.  . furosemide (LASIX) 20 MG tablet TAKE 1 TABLET EVERY OTHER DAY  . glimepiride (AMARYL) 4 MG tablet Take 4 mg by mouth daily with breakfast.   . isosorbide mononitrate  (IMDUR) 60 MG 24 hr tablet TAKE 1 TABLET EVERY DAY  . metFORMIN (GLUCOPHAGE) 1000 MG tablet Take 1,000 mg by mouth 2 (two) times daily with a meal.  . Multiple Vitamin (MULTIVITAMIN WITH MINERALS) TABS tablet Take 1 tablet by mouth daily.  . nitroGLYCERIN (NITROSTAT) 0.4 MG SL tablet Place 1 tablet (0.4 mg total) under the tongue every 5 (five) minutes as needed for chest pain.  [name] DELICA LANCETS FINE MISC   . pravastatin (PRAVACHOL) 40 MG tablet TAKE 1 TABLET EVERY DAY  . sitaGLIPtin (JANUVIA) 50 MG tablet Take 1 tablet once a day for diabetes  . triamcinolone cream (KENALOG) 0.1 % apply to affected area ON FACE twice a day for 5 to 7 days     Allergies:   Prednisone and Hydralazine hcl   Social History   Socioeconomic History  . Marital status: Married    Spouse name: Not on file  . Number of children: Not on file  . Years of education: Not on file  . Highest education level: Not on file  Occupational History  . Not on file  Social Needs  . Financial resource strain: Not on file  . Food insecurity:    Worry: Not on file    Inability: Not on file  . Transportation needs:    Medical: Not on file    Non-medical: Not on file  Tobacco Use  . Smoking status: Never Smoker  . Smokeless tobacco: Never Used  Substance and Sexual Activity  . Alcohol use: No  . Drug use: No  . Sexual activity: Not Currently  Lifestyle  . Physical activity:    Days per week: Not on file    Minutes per session: Not on file  . Stress: Not on file  Relationships  . Social connections:    Talks on phone: Not on file    Gets together: Not on file    Attends religious service: Not on file    Active member of club or organization: Not on file    Attends meetings of clubs or organizations: Not on file    Relationship status: Not on file  Other Topics Concern  . Not on file  Social History Narrative  . Not on file     Family History: The patient's family history includes Atrial  fibrillation in an other family member; Hypertension in an other family member. ROS:   Please see the history of present illness.    All other systems reviewed and are negative.  EKGs/Labs/Other Studies Reviewed:    The following studies were reviewed today:  EKG:  EKG ordered today.  The ekg ordered today demonstrates [place] nonspecific T waves  Recent Labs: No results found for requested labs within last 8760 hours.  Recent Lipid Panel No results found for: CHOL, TRIG, HDL, CHOLHDL,  VLDL, LDLCALC, LDLDIRECT  Physical Exam:    VS:  BP (!) 190/84 (BP Location: Right Arm, Patient Position: Sitting, Cuff Size: Normal)   Pulse 88   Ht 5\' 1"  (1.549 m)   Wt 173 lb (78.5 kg)   SpO2 96%   BMI 32.69 kg/m     Wt Readings from Last 3 Encounters:  08/22/18 173 lb (78.5 kg)  05/02/18 172 lb (78 kg)  09/18/17 167 lb (75.8 kg)     GEN:  Well nourished, well developed in no acute distress HEENT: Normal NECK: No JVD; No carotid bruits LYMPHATICS: No lymphadenopathy CARDIAC: RRR, no murmurs, rubs, gallops RESPIRATORY:  Clear to auscultation without rales, wheezing or rhonchi  ABDOMEN: Soft, non-tender, non-distended MUSCULOSKELETAL:  No edema; No deformity  SKIN: Warm and dry NEUROLOGIC:  Alert and oriented x 3 PSYCHIATRIC:  Normal affect    Signed, Lindsey More, MD  08/22/2018 1:36 PM    [facility]

## 2018-08-22 ENCOUNTER — Encounter: Payer: Self-pay | Admitting: Cardiology

## 2018-08-22 ENCOUNTER — Ambulatory Visit (INDEPENDENT_AMBULATORY_CARE_PROVIDER_SITE_OTHER): Payer: Self-pay | Admitting: Cardiology

## 2018-08-22 VITALS — BP 190/84 | HR 88 | Ht 61.0 in | Wt 173.0 lb

## 2018-08-22 DIAGNOSIS — I25119 Atherosclerotic heart disease of native coronary artery with unspecified angina pectoris: Secondary | ICD-10-CM

## 2018-08-22 DIAGNOSIS — I110 Hypertensive heart disease with heart failure: Secondary | ICD-10-CM

## 2018-08-22 DIAGNOSIS — E782 Mixed hyperlipidemia: Secondary | ICD-10-CM

## 2018-08-22 DIAGNOSIS — R001 Bradycardia, unspecified: Secondary | ICD-10-CM | POA: Insufficient documentation

## 2018-08-22 HISTORY — DX: Bradycardia, unspecified: R00.1

## 2018-08-22 MED ORDER — METOPROLOL TARTRATE 50 MG PO TABS: 25.0000 mg | ORAL_TABLET | Freq: Every day | ORAL | 1 refills | Status: DC

## 2018-08-22 NOTE — Patient Instructions (Signed)
Medication Instructions:  Your physician has recommended you make the following change in your medication:   RESTART metoprolol tartrate (lopressor) 50 mg: Take 0.5 tablet (25 mg) daily   If you need a refill on your cardiac medications before your next appointment, please call your pharmacy.   Lab work: None  If you have labs (blood work) drawn today and your tests are completely normal, you will receive your results only by: Marland Kitchen MyChart Message (if you have MyChart) OR . A paper copy in the mail If you have any lab test that is abnormal or we need to change your treatment, we will call you to review the results.  Testing/Procedures: You had an EKG today.   Follow-Up: At [facility], you and your health needs are our priority.  As part of our continuing mission to provide you with exceptional heart care, we have created designated Provider Care Teams.  These Care Teams include your primary Cardiologist (physician) and Advanced Practice Providers (APPs -  Physician Assistants and Nurse Practitioners) who all work together to provide you with the care you need, when you need it. . You will need a follow up appointment in 3 months.  Please call our office 2 months in advance to schedule this appointment.    Any Other Special Instructions Will Be Listed Below (If Applicable). **Please check your blood pressure and heart rate at the same time every day and record these readings. Keep a log of these readings and bring to your next appointment for Dr. Bettina Gavia to review.

## 2018-08-23 DIAGNOSIS — H5213 Myopia, bilateral: Secondary | ICD-10-CM | POA: Diagnosis not present

## 2018-08-23 DIAGNOSIS — Z7984 Long term (current) use of oral hypoglycemic drugs: Secondary | ICD-10-CM | POA: Diagnosis not present

## 2018-08-23 DIAGNOSIS — E119 Type 2 diabetes mellitus without complications: Secondary | ICD-10-CM | POA: Diagnosis not present

## 2018-08-23 DIAGNOSIS — H52203 Unspecified astigmatism, bilateral: Secondary | ICD-10-CM | POA: Diagnosis not present

## 2018-08-23 DIAGNOSIS — Z961 Presence of intraocular lens: Secondary | ICD-10-CM | POA: Diagnosis not present

## 2018-08-23 DIAGNOSIS — H524 Presbyopia: Secondary | ICD-10-CM | POA: Diagnosis not present

## 2018-09-03 DIAGNOSIS — I10 Essential (primary) hypertension: Secondary | ICD-10-CM | POA: Diagnosis not present

## 2018-09-03 DIAGNOSIS — R001 Bradycardia, unspecified: Secondary | ICD-10-CM | POA: Diagnosis not present

## 2018-09-03 DIAGNOSIS — Z6833 Body mass index (BMI) 33.0-33.9, adult: Secondary | ICD-10-CM | POA: Diagnosis not present

## 2018-09-19 DIAGNOSIS — I10 Essential (primary) hypertension: Secondary | ICD-10-CM | POA: Diagnosis not present

## 2018-09-19 DIAGNOSIS — Z6833 Body mass index (BMI) 33.0-33.9, adult: Secondary | ICD-10-CM | POA: Diagnosis not present

## 2018-09-19 DIAGNOSIS — R001 Bradycardia, unspecified: Secondary | ICD-10-CM | POA: Diagnosis not present

## 2018-10-04 DIAGNOSIS — I10 Essential (primary) hypertension: Secondary | ICD-10-CM | POA: Diagnosis not present

## 2018-10-04 DIAGNOSIS — R001 Bradycardia, unspecified: Secondary | ICD-10-CM | POA: Diagnosis not present

## 2018-10-04 DIAGNOSIS — Z139 Encounter for screening, unspecified: Secondary | ICD-10-CM | POA: Diagnosis not present

## 2018-10-04 DIAGNOSIS — Z6833 Body mass index (BMI) 33.0-33.9, adult: Secondary | ICD-10-CM | POA: Diagnosis not present

## 2018-10-04 DIAGNOSIS — Z9181 History of falling: Secondary | ICD-10-CM | POA: Diagnosis not present

## 2018-10-04 DIAGNOSIS — E669 Obesity, unspecified: Secondary | ICD-10-CM | POA: Diagnosis not present

## 2018-10-10 DIAGNOSIS — I10 Essential (primary) hypertension: Secondary | ICD-10-CM | POA: Diagnosis not present

## 2018-10-10 DIAGNOSIS — R001 Bradycardia, unspecified: Secondary | ICD-10-CM | POA: Diagnosis not present

## 2018-10-10 DIAGNOSIS — Z6833 Body mass index (BMI) 33.0-33.9, adult: Secondary | ICD-10-CM | POA: Diagnosis not present

## 2018-10-17 DIAGNOSIS — Z6833 Body mass index (BMI) 33.0-33.9, adult: Secondary | ICD-10-CM | POA: Diagnosis not present

## 2018-10-17 DIAGNOSIS — E785 Hyperlipidemia, unspecified: Secondary | ICD-10-CM | POA: Diagnosis not present

## 2018-10-17 DIAGNOSIS — Z1331 Encounter for screening for depression: Secondary | ICD-10-CM | POA: Diagnosis not present

## 2018-10-17 DIAGNOSIS — Z136 Encounter for screening for cardiovascular disorders: Secondary | ICD-10-CM | POA: Diagnosis not present

## 2018-10-17 DIAGNOSIS — E669 Obesity, unspecified: Secondary | ICD-10-CM | POA: Diagnosis not present

## 2018-10-17 DIAGNOSIS — Z9181 History of falling: Secondary | ICD-10-CM | POA: Diagnosis not present

## 2018-10-17 DIAGNOSIS — Z0000 Encounter for general adult medical examination without abnormal findings: Secondary | ICD-10-CM | POA: Diagnosis not present

## 2018-10-17 DIAGNOSIS — I10 Essential (primary) hypertension: Secondary | ICD-10-CM | POA: Diagnosis not present

## 2018-10-17 DIAGNOSIS — R001 Bradycardia, unspecified: Secondary | ICD-10-CM | POA: Diagnosis not present

## 2018-10-28 DIAGNOSIS — Z6833 Body mass index (BMI) 33.0-33.9, adult: Secondary | ICD-10-CM | POA: Diagnosis not present

## 2018-10-28 DIAGNOSIS — I10 Essential (primary) hypertension: Secondary | ICD-10-CM | POA: Diagnosis not present

## 2018-11-08 ENCOUNTER — Telehealth: Payer: Self-pay | Admitting: Cardiology

## 2018-11-08 NOTE — Telephone Encounter (Signed)
Patient agreed on [employer] Pre-Appointment Phone Call  Steps For Call:  1. Confirm consent - "In the setting of the current Covid19 crisis, you are scheduled for a (phone or video) visit with your provider on (date) at (time).  Just as we do with many in-office visits, in order for you to participate in this visit, we must obtain consent.  If you'd like, I can send this to your mychart (if signed up) or email for you to review.  Otherwise, I can obtain your verbal consent now.  All virtual visits are billed to your insurance company just like a normal visit would be.  By agreeing to a virtual visit, we'd like you to understand that the technology does not allow for your provider to perform an examination, and thus may limit your provider's ability to fully assess your condition.  Finally, though the technology is pretty good, we cannot assure that it will always work on either your or our end, and in the setting of a video visit, we may have to convert it to a phone-only visit.  In either situation, we cannot ensure that we have a secure connection.  Are you willing to proceed?"  2. Give patient instructions for WebEx download to smartphone as below if video visit  3. Advise patient to be prepared with any vital sign or heart rhythm information, their current medicines, and a piece of paper and pen handy for any instructions they may receive the day of their visit  4. Inform patient they will receive a phone call 15 minutes prior to their appointment time (may be from unknown caller ID) so they should be prepared to answer  5. Confirm that appointment type is correct in Epic appointment notes (video vs telephone)    TELEPHONE CALL NOTE  Lindsey Frost has been deemed a candidate for a follow-up tele-health visit to limit community exposure during the Covid-19 pandemic. I spoke with the patient via phone to ensure availability of phone/video source, confirm preferred  email & phone number, and discuss instructions and expectations.  I reminded Lindsey Frost to be prepared with any vital sign and/or heart rhythm information that could potentially be obtained via home monitoring, at the time of her visit. I reminded Lindsey Frost to expect a phone call at the time of her visit if her visit.  Did the patient verbally acknowledge consent to treatment? YES  Frederic Jericho 11/08/2018 4:06 PM   DOWNLOADING THE [place], go to [employer] and type in WebEx in the search bar. [place] [employer], the blue/green circle. The app is free but as with any other app downloads, their phone may require them to verify saved payment information or Apple password. The patient does NOT have to create an account.  - If Android, ask patient to go to [employer] and type in WebEx in the search bar. [place] [employer], the blue/green circle. The app is free but as with any other app downloads, their phone may require them to verify saved payment information or Android password. The patient does NOT have to create an account.   CONSENT FOR TELE-HEALTH VISIT - PLEASE REVIEW  I hereby voluntarily request, consent and authorize [facility] and its employed or contracted physicians, physician assistants, nurse practitioners or other licensed health care professionals (the Practitioner), to provide me with telemedicine health care services (the Services") as deemed  necessary by the treating Practitioner. I acknowledge and consent to receive the Services by the Practitioner via telemedicine. I understand that the telemedicine visit will involve communicating with the Practitioner through live audiovisual communication technology and the disclosure of certain medical information by electronic transmission. I acknowledge that I have been given the opportunity to request an in-person assessment or other available alternative  prior to the telemedicine visit and am voluntarily participating in the telemedicine visit.  I understand that I have the right to withhold or withdraw my consent to the use of telemedicine in the course of my care at any time, without affecting my right to future care or treatment, and that the Practitioner or I may terminate the telemedicine visit at any time. I understand that I have the right to inspect all information obtained and/or recorded in the course of the telemedicine visit and may receive copies of available information for a reasonable fee.  I understand that some of the potential risks of receiving the Services via telemedicine include:   Delay or interruption in medical evaluation due to technological equipment failure or disruption;  Information transmitted may not be sufficient (e.g. poor resolution of images) to allow for appropriate medical decision making by the Practitioner; and/or   In rare instances, security protocols could fail, causing a breach of personal health information.  Furthermore, I acknowledge that it is my responsibility to provide information about my medical history, conditions and care that is complete and accurate to the best of my ability. I acknowledge that Practitioner's advice, recommendations, and/or decision may be based on factors not within their control, such as incomplete or inaccurate data provided by me or distortions of diagnostic images or specimens that may result from electronic transmissions. I understand that the practice of medicine is not an exact science and that Practitioner makes no warranties or guarantees regarding treatment outcomes. I acknowledge that I will receive a copy of this consent concurrently upon execution via email to the email address I last provided but may also request a printed copy by calling the office of [facility].    I understand that my insurance will be billed for this visit.   I have read or had this  consent read to me.  I understand the contents of this consent, which adequately explains the benefits and risks of the Services being provided via telemedicine.   I have been provided ample opportunity to ask questions regarding this consent and the Services and have had my questions answered to my satisfaction.  I give my informed consent for the services to be provided through the use of telemedicine in my medical care  By participating in this telemedicine visit I agree to the above.

## 2018-11-15 DIAGNOSIS — E1165 Type 2 diabetes mellitus with hyperglycemia: Secondary | ICD-10-CM | POA: Diagnosis not present

## 2018-11-15 DIAGNOSIS — E782 Mixed hyperlipidemia: Secondary | ICD-10-CM | POA: Diagnosis not present

## 2018-11-15 DIAGNOSIS — I10 Essential (primary) hypertension: Secondary | ICD-10-CM | POA: Diagnosis not present

## 2018-11-15 DIAGNOSIS — G4730 Sleep apnea, unspecified: Secondary | ICD-10-CM | POA: Diagnosis not present

## 2018-11-15 DIAGNOSIS — E1159 Type 2 diabetes mellitus with other circulatory complications: Secondary | ICD-10-CM | POA: Diagnosis not present

## 2018-11-15 DIAGNOSIS — I2510 Atherosclerotic heart disease of native coronary artery without angina pectoris: Secondary | ICD-10-CM | POA: Diagnosis not present

## 2018-11-19 ENCOUNTER — Other Ambulatory Visit: Payer: Self-pay | Admitting: *Deleted

## 2018-11-19 ENCOUNTER — Encounter: Payer: Self-pay | Admitting: *Deleted

## 2018-11-19 NOTE — Patient Outreach (Signed)
[employer]A HRA follow up call. Spoke with Lindsey Frost today. Confirmed identity. Advised reason for the call is to follow up on her [employer] HRA. She was reluctant to speak on the phone but after explaining [facility] relationship with [facility], she did talk with me about her health concerns.  She is a diabetic, thinks her last HgbA1C was 7.0. Sees [facility] for endocrinology. Medication was just changed.  Hx CAD with MI and HF. She does weigh daily and says her weight is stable. She did not acknowledge that she follows a low salt diet. Provided explanation to do so.  She and her husband have advanced directives but they are not in the Epic chart. Requested that she and her husband provide their doctors with copies of these documents to be placed in their medical record.  Lindsey Frost has consented to participate in complex case management with a focus on diabetes, CAD and HF.  Lindsey Frost is also an [employer] member but was not at home today. I have asked that Lindsey Frost request that he call me.  [name]. [name], MSN, [facility] Gerontological Nurse Practitioner [facility] Care Management [phone]

## 2018-11-21 ENCOUNTER — Encounter: Payer: Self-pay | Admitting: Cardiology

## 2018-11-21 ENCOUNTER — Other Ambulatory Visit: Payer: Self-pay

## 2018-11-21 ENCOUNTER — Telehealth (INDEPENDENT_AMBULATORY_CARE_PROVIDER_SITE_OTHER): Payer: Self-pay | Admitting: Cardiology

## 2018-11-21 VITALS — BP 161/85 | HR 66 | Ht 61.0 in | Wt 169.3 lb

## 2018-11-21 DIAGNOSIS — E782 Mixed hyperlipidemia: Secondary | ICD-10-CM

## 2018-11-21 DIAGNOSIS — I110 Hypertensive heart disease with heart failure: Secondary | ICD-10-CM

## 2018-11-21 DIAGNOSIS — I25119 Atherosclerotic heart disease of native coronary artery with unspecified angina pectoris: Secondary | ICD-10-CM

## 2018-11-21 DIAGNOSIS — Z7189 Other specified counseling: Secondary | ICD-10-CM

## 2018-11-21 DIAGNOSIS — I5032 Chronic diastolic (congestive) heart failure: Secondary | ICD-10-CM

## 2018-11-21 DIAGNOSIS — R001 Bradycardia, unspecified: Secondary | ICD-10-CM

## 2018-11-21 NOTE — Progress Notes (Signed)
Virtual Visit via Video Note   This visit type was conducted due to national recommendations for restrictions regarding the COVID-19 Pandemic (e.g. social distancing) in an effort to limit this patient's exposure and mitigate transmission in our community.  Due to her co-morbid illnesses, this patient is at least at moderate risk for complications without adequate follow up.  This format is felt to be most appropriate for this patient at this time.  All issues noted in this document were discussed and addressed.  A limited physical exam was performed with this format.  Please refer to the patient's chart for her consent to telehealth for [facility].   Evaluation Performed:  Follow-up visit  Date:  11/21/2018   ID:  Lindsey Frost, DOB 1940-08-09, MRN [id number]  Patient Location: Home Provider Location: Office  PCP:  Lowella Dandy, NP  Cardiologist:  No primary care provider on file. Dr Bettina Gavia Electrophysiologist:  None   Chief Complaint:  FU for heart failure  History of Present Illness:    Lindsey Heloise Gordan is a 78 y.o. female with a hx of bradycardia HR 40-50BPM,CAD and PCI and Taxus stent to LAD 2006 , Dyslipidemia, HTN iron deficiency anemia and diastolic heart failure  last seen 01/06/20202. At her last visit she had bradycardia rates less than 50 bpm and will reduce the dose of her beta-blocker by 50%.  Her hypertension was poorly controlled and hydralazine was initiated.  She developed a rash with hydralazine was discontinued was placed on amlodipine by her PCP and tells me her blood pressures generally runs in the range of 150/60-70.  She also was seen by endocrinology put on Invokana and her diuretic was decreased to every other day.  She has had no edema and her weight is stable but I have asked her to weigh daily and to take it on the off days if her weight exceeds 171 pounds.  She has had no angina edema orthopnea shortness of breath palpitation or syncope.  I reviewed  her recent labs from January that were all reassuring.  I think her cholesterol is at target and she will continue her current statin.  She practices social distancing is isolated in her home wears a mask outdoors and has good handwashing and hand sanitation technique  The patient does not have symptoms concerning for COVID-19 infection (fever, chills, cough, or new shortness of breath).    Past Medical History:  Diagnosis Date  . Acute kidney injury ([place]) 09/04/2015  . Altered awareness, transient 02/08/2012  . Arthritis    OA AND PAIN LEFT KNEE  AND ARTHRITIS IN FINGERS  . Cancer (HCC)    UTERINE CANCER - S/P HYSTERECTOMY  . CHF exacerbation ([place]) 02/08/2012  . Chronic diastolic CHF (congestive heart failure) ([place]) 09/04/2015  . Coronary artery disease    Followed by Dr.Emmalin Jaquess  . Coronary atherosclerosis of native coronary artery 02/08/2012  . Diabetes mellitus   . Essential hypertension, benign 02/08/2012  . GERD (gastroesophageal reflux disease)   . Hypercalcemia 09/04/2015  . Hyperlipidemia   . Hypertension   . Left hip pain 09/04/2015  . Leukocytosis 09/04/2015  . Mixed hyperlipidemia 02/08/2012  . Myocardial infarction ([place]) JULY 2006   [place]  . SIRS (systemic inflammatory response syndrome) ([place]) 09/04/2015  . Sleep apnea    PT HAS CPAP MASK AND MACHINE AT HOME--BUT DOES NOT USE-COULD NOT TOLERATE  . Type 2 diabetes mellitus with circulatory disorder ([employer]) 09/04/2015   Past Surgical  History:  Procedure Laterality Date  . ABDOMINAL HYSTERECTOMY  2000   UTERINE CANCER  . BILATERAL CATARACT EXTRACTIONS  2005  . CORONARY ANGIOPLASTY    . LEFT KNEE ARTHROSCOPY  2012  . TOTAL KNEE ARTHROPLASTY  02/05/2012   Procedure: TOTAL KNEE ARTHROPLASTY;  Surgeon: Mauri Pole, MD;  Location: [facility];  Service: Orthopedics;  Laterality: Left;     Current Meds  Medication Sig  . acetaminophen (TYLENOL) 500 MG tablet Take 1,000 mg by mouth every 4 (four) hours as  needed.   Marland Kitchen amLODipine (NORVASC) 10 MG tablet TK 1 T PO D  . aspirin EC 81 MG tablet Take 162 mg by mouth every morning.  . Calcium-Vitamin D (CALTRATE 600 PLUS-VIT D PO) Take 1 tablet by mouth every morning.   . canagliflozin (INVOKANA) 100 MG TABS tablet Take 50 mg by mouth daily.  . Candesartan Cilexetil-HCTZ 32-25 MG TABS Take 1 tablet once a day for blood pressure and heart  . cloNIDine (CATAPRES) 0.1 MG tablet Only take if systolic BP is >161  . Cyanocobalamin (VITAMIN B-12) 5000 MCG SUBL Place 1 mcg under the tongue daily.  . furosemide (LASIX) 20 MG tablet Take 10 mg by mouth every other day.  Marland Kitchen glimepiride (AMARYL) 4 MG tablet Take 4 mg by mouth daily with breakfast.   . isosorbide mononitrate (IMDUR) 60 MG 24 hr tablet TAKE 1 TABLET EVERY DAY  . metFORMIN (GLUCOPHAGE) 1000 MG tablet Take 1,000 mg by mouth 2 (two) times daily with a meal.  . metoprolol tartrate (LOPRESSOR) 50 MG tablet Take 50 mg by mouth at bedtime.  . Multiple Vitamin (MULTIVITAMIN WITH MINERALS) TABS tablet Take 1 tablet by mouth daily.  . nitroGLYCERIN (NITROSTAT) 0.4 MG SL tablet Place 1 tablet (0.4 mg total) under the tongue every 5 (five) minutes as needed for chest pain.  [name] DELICA LANCETS FINE MISC   . pantoprazole (PROTONIX) 40 MG tablet Take 40 mg by mouth daily.  . pravastatin (PRAVACHOL) 40 MG tablet TAKE 1 TABLET EVERY DAY  . sitaGLIPtin (JANUVIA) 50 MG tablet Take 1 tablet once a day for diabetes  . triamcinolone cream (KENALOG) 0.1 % apply to affected area ON FACE twice a day for 5 to 7 days     Allergies:   Prednisone and Hydralazine hcl   Social History   Tobacco Use  . Smoking status: Never Smoker  . Smokeless tobacco: Never Used  Substance Use Topics  . Alcohol use: No  . Drug use: No     Family Hx: The patient's family history includes Atrial fibrillation in an other family member; Hypertension in an other family member.  ROS:   Please see the history of present illness.      All other systems reviewed and are negative.   Prior CV studies:   The following studies were reviewed today:    Labs/Other Tests and Data Reviewed:     Recent Labs:  08/12/2018 hemoglobin 13.4 hematocrit 47.5 hemoglobin  A1c 7.5  CMP normal creatinine 1.17 potassium 4.2  cholesterol 144 LDL 97 HDL 33  Recent Lipid Panel No results found for: CHOL, TRIG, HDL, CHOLHDL, LDLCALC, LDLDIRECT  Wt Readings from Last 3 Encounters:  11/21/18 169 lb 4.8 oz (76.8 kg)  08/22/18 173 lb (78.5 kg)  05/02/18 172 lb (78 kg)     Objective:    Vital Signs:  BP (!) 161/85 (BP Location: Left Arm, Patient Position: Sitting)   Pulse 66   Ht 5'  1" (1.549 m)   Wt 169 lb 4.8 oz (76.8 kg)   BMI 31.99 kg/m    She is alert oriented thought and cognition are normal mood and affect normal well developed female in no acute distress.   ASSESSMENT & PLAN:    1. Congestive heart failure stable New York Heart Association class I I am somewhat concerned with adding a calcium channel blocker although she has no edema and with decreasing her diuretic although her weight is stable.  I asked her to take a dose of diuretic on the off days if she has a weight gain. 2. Stable continue her current treatment including diuretic ARB calcium channel blocker beta-blocker and clonidine as needed. 3. Stable CAD continue medical treatment including aspirin statin beta-blocker calcium channel blocker.  At this time she does not require an ischemia evaluation 4. Bradycardia improved she tolerates the minimum dose beta-blocker home heart rates are running 60 or greater in next office visit we will do an EKG.  I would really like to see her continue the beta-blocker with her CAD and hypertension if at all possible 5. Stable dyslipidemia her lipids are at target continue a low intensity statin  COVID-19 Education: The signs and symptoms of COVID-19 were discussed with the patient and how to seek care for testing (follow up  with PCP or arrange E-visit).  The importance of social distancing was discussed today.  Time:   Today, I have spent 30 minutes with the patient with telehealth technology discussing the above problems.     Medication Adjustments/Labs and Tests Ordered: Current medicines are reviewed at length with the patient today.  Concerns regarding medicines are outlined above.   Tests Ordered: No orders of the defined types were placed in this encounter.   Medication Changes: No orders of the defined types were placed in this encounter.   Disposition:  Follow up Septmeber  Signed, Shirlee More, MD  11/21/2018 8:51 AM    [place]

## 2018-11-21 NOTE — Patient Instructions (Addendum)
Medication Instructions:  Your physician has recommended you make the following change in your medication:  START LASIX 10mg  every other day. Take additional dose if weight greater than 171lb  If you need a refill on your cardiac medications before your next appointment, please call your pharmacy.   Lab work: NONE If you have labs (blood work) drawn today and your tests are completely normal, you will receive your results only by: Marland Kitchen MyChart Message (if you have MyChart) OR . A paper copy in the mail If you have any lab test that is abnormal or we need to change your treatment, we will call you to review the results.  Testing/Procedures: None  Follow-Up: At [facility], you and your health needs are our priority.  As part of our continuing mission to provide you with exceptional heart care, we have created designated Provider Care Teams.  These Care Teams include your primary Cardiologist (physician) and Advanced Practice Providers (APPs -  Physician Assistants and Nurse Practitioners) who all work together to provide you with the care you need, when you need it. You will need a follow up appointment in 6 months.

## 2018-11-22 ENCOUNTER — Other Ambulatory Visit: Payer: Self-pay

## 2018-11-22 NOTE — Patient Outreach (Signed)
New referral: Dx: HF and DM  Placed call to patient and explained reason for call. Patient reports that she has talked to her cardiologist and endocrinologist this week by phone after talking to [facility] and she feels like she is doing well and does not need help at this time.   PLAN: patient denies needs today. Patient agreed to me mailing a letter and she would call me in the future if she has needs.  Tomasa Rand, RN, BSN, CEN [facility] [employer] [phone]

## 2018-11-29 DIAGNOSIS — K219 Gastro-esophageal reflux disease without esophagitis: Secondary | ICD-10-CM | POA: Diagnosis not present

## 2018-11-29 DIAGNOSIS — D509 Iron deficiency anemia, unspecified: Secondary | ICD-10-CM | POA: Diagnosis not present

## 2018-11-29 DIAGNOSIS — E785 Hyperlipidemia, unspecified: Secondary | ICD-10-CM | POA: Diagnosis not present

## 2018-11-29 DIAGNOSIS — I10 Essential (primary) hypertension: Secondary | ICD-10-CM | POA: Diagnosis not present

## 2018-11-29 DIAGNOSIS — E1159 Type 2 diabetes mellitus with other circulatory complications: Secondary | ICD-10-CM | POA: Diagnosis not present

## 2018-11-29 DIAGNOSIS — I509 Heart failure, unspecified: Secondary | ICD-10-CM | POA: Diagnosis not present

## 2019-01-13 DIAGNOSIS — R001 Bradycardia, unspecified: Secondary | ICD-10-CM | POA: Diagnosis not present

## 2019-01-13 DIAGNOSIS — R531 Weakness: Secondary | ICD-10-CM | POA: Diagnosis not present

## 2019-01-13 DIAGNOSIS — D509 Iron deficiency anemia, unspecified: Secondary | ICD-10-CM | POA: Diagnosis not present

## 2019-01-13 DIAGNOSIS — Z6833 Body mass index (BMI) 33.0-33.9, adult: Secondary | ICD-10-CM | POA: Diagnosis not present

## 2019-01-13 DIAGNOSIS — I10 Essential (primary) hypertension: Secondary | ICD-10-CM | POA: Diagnosis not present

## 2019-01-15 ENCOUNTER — Other Ambulatory Visit: Payer: Self-pay | Admitting: *Deleted

## 2019-01-15 DIAGNOSIS — R001 Bradycardia, unspecified: Secondary | ICD-10-CM

## 2019-01-20 ENCOUNTER — Other Ambulatory Visit (INDEPENDENT_AMBULATORY_CARE_PROVIDER_SITE_OTHER): Payer: Self-pay

## 2019-01-20 DIAGNOSIS — R001 Bradycardia, unspecified: Secondary | ICD-10-CM

## 2019-02-11 DIAGNOSIS — R001 Bradycardia, unspecified: Secondary | ICD-10-CM | POA: Diagnosis not present

## 2019-02-18 DIAGNOSIS — H6981 Other specified disorders of Eustachian tube, right ear: Secondary | ICD-10-CM | POA: Diagnosis not present

## 2019-02-25 DIAGNOSIS — I509 Heart failure, unspecified: Secondary | ICD-10-CM | POA: Diagnosis not present

## 2019-02-25 DIAGNOSIS — I10 Essential (primary) hypertension: Secondary | ICD-10-CM | POA: Diagnosis not present

## 2019-02-25 DIAGNOSIS — K219 Gastro-esophageal reflux disease without esophagitis: Secondary | ICD-10-CM | POA: Diagnosis not present

## 2019-02-25 DIAGNOSIS — Z6832 Body mass index (BMI) 32.0-32.9, adult: Secondary | ICD-10-CM | POA: Diagnosis not present

## 2019-02-25 DIAGNOSIS — E785 Hyperlipidemia, unspecified: Secondary | ICD-10-CM | POA: Diagnosis not present

## 2019-02-25 DIAGNOSIS — D509 Iron deficiency anemia, unspecified: Secondary | ICD-10-CM | POA: Diagnosis not present

## 2019-02-25 DIAGNOSIS — E1159 Type 2 diabetes mellitus with other circulatory complications: Secondary | ICD-10-CM | POA: Diagnosis not present

## 2019-03-04 ENCOUNTER — Other Ambulatory Visit: Payer: Self-pay | Admitting: Cardiology

## 2019-03-04 MED ORDER — ISOSORBIDE MONONITRATE ER 60 MG PO TB24: 60.0000 mg | ORAL_TABLET | Freq: Every day | ORAL | 1 refills | Status: DC

## 2019-03-04 NOTE — Telephone Encounter (Signed)
°*  STAT* If patient is at the pharmacy, call can be transferred to refill team.   1. Which medications need to be refilled? (please list name of each medication and dose if known) isisorbide 60mg  tablet  2. Which pharmacy/location (including street and city if local pharmacy) is medication to be sent to?[facility] on [address] [place]  3. Do they need a 30 day or 90 day supply? [place]

## 2019-03-05 MED ORDER — ISOSORBIDE MONONITRATE ER 60 MG PO TB24: 60.0000 mg | ORAL_TABLET | Freq: Every day | ORAL | 0 refills | Status: DC

## 2019-03-05 NOTE — Telephone Encounter (Signed)
Rx for isosorbide sent to [facility] as requested.

## 2019-03-05 NOTE — Addendum Note (Signed)
Addended by: Stevan Born on: 03/05/2019 08:29 AM   Modules accepted: Orders

## 2019-03-18 DIAGNOSIS — E782 Mixed hyperlipidemia: Secondary | ICD-10-CM | POA: Diagnosis not present

## 2019-03-18 DIAGNOSIS — E1165 Type 2 diabetes mellitus with hyperglycemia: Secondary | ICD-10-CM | POA: Diagnosis not present

## 2019-03-20 ENCOUNTER — Other Ambulatory Visit: Payer: Self-pay | Admitting: Cardiology

## 2019-03-24 DIAGNOSIS — I129 Hypertensive chronic kidney disease with stage 1 through stage 4 chronic kidney disease, or unspecified chronic kidney disease: Secondary | ICD-10-CM | POA: Diagnosis not present

## 2019-03-24 DIAGNOSIS — I2510 Atherosclerotic heart disease of native coronary artery without angina pectoris: Secondary | ICD-10-CM | POA: Diagnosis not present

## 2019-03-24 DIAGNOSIS — N183 Chronic kidney disease, stage 3 (moderate): Secondary | ICD-10-CM | POA: Diagnosis not present

## 2019-03-24 DIAGNOSIS — E1122 Type 2 diabetes mellitus with diabetic chronic kidney disease: Secondary | ICD-10-CM | POA: Diagnosis not present

## 2019-03-24 DIAGNOSIS — E782 Mixed hyperlipidemia: Secondary | ICD-10-CM | POA: Diagnosis not present

## 2019-03-24 DIAGNOSIS — G4730 Sleep apnea, unspecified: Secondary | ICD-10-CM | POA: Diagnosis not present

## 2019-03-31 ENCOUNTER — Telehealth: Payer: Self-pay | Admitting: Cardiology

## 2019-03-31 NOTE — Telephone Encounter (Signed)
Patient is having a low heartrate and states it would not even register on her BP monitor, she eels really bad. Please call her.

## 2019-04-01 MED ORDER — METOPROLOL SUCCINATE ER 25 MG PO TB24: 25.0000 mg | ORAL_TABLET | Freq: Every day | ORAL | Status: DC

## 2019-04-01 NOTE — Addendum Note (Signed)
Addended by: Austin Miles on: 04/01/2019 10:26 AM   Modules accepted: Orders

## 2019-04-01 NOTE — Telephone Encounter (Signed)
Patient informed to stop taking metoprolol succinate 25 mg daily. Advised patient to continue monitoring her heart rate and blood pressure daily and contact our office with any further questions or concerns. Patient is agreeable and verbalized understanding. No further questions.

## 2019-04-01 NOTE — Addendum Note (Signed)
Addended by: Austin Miles on: 04/01/2019 09:13 AM   Modules accepted: Orders

## 2019-04-01 NOTE — Telephone Encounter (Signed)
Patient states that yesterday morning when she checked her blood pressure, her heart rate was too low for the BP machine to register. When she checked later yesterday morning, her HR was 50. Then at 9:45 am it was 42 and her HR was 77 around 11:40 am. Patient monitors her heart rate daily and she has had three other episodes this month. When she has bradycardia, she states she feels weak and tired. This morning her BP was 156/79 and her HR was 64 before taking her morning medications. She is taking all her cardiac medications as prescribed. Please advise. Thanks!

## 2019-04-01 NOTE — Telephone Encounter (Signed)
Stop metoprolol

## 2019-05-03 NOTE — Progress Notes (Signed)
Cardiology Office Note:    Date:  05/05/2019   ID:  Lindsey Frost, [place] 1941-03-18, MRN [id number]  PCP:  Lowella Dandy, NP  Cardiologist:  Shirlee More, MD    Referring MD: Lowella Dandy, NP    ASSESSMENT:    1. Coronary artery disease involving native coronary artery of native heart with angina pectoris ([place])   2. Mixed hyperlipidemia   3. Hypertensive heart disease with heart failure ([place])   4. Bradycardia   5. Other iron deficiency anemia    PLAN:    In order of problems listed above:  1. Stable CAD continue medical treatment at this time I would not advise an ischemia evaluation as she is having no angina New York Heart Association class I.  She is no longer on a beta-blocker with symptomatic sinus bradycardia 2. Stable continue her high intensity statin lipids at target she has minimal nonspecific elevation in liver function test 3. Mildly decompensated increase the dose of furosemide should help to get her blood pressure to target and I cautioned her to stop adding salt to her diet 4. Improved I encouraged her to reassured and I do not see any indication to consider pacemaker continue avoid beta-blocker 5. Improved hemoglobin is normal   Next appointment: 6 months   Medication Adjustments/Labs and Tests Ordered: Current medicines are reviewed at length with the patient today.  Concerns regarding medicines are outlined above.  No orders of the defined types were placed in this encounter.  No orders of the defined types were placed in this encounter.   No chief complaint on file.   History of Present Illness:    Lindsey Frost is a 78 y.o. female with a hx of sinus bradycardia ,CAD and PCI and Taxus stent to LAD 2006 , Dyslipidemia, HTN iron deficiency anemia and diastolic heart failure  last seen virtual visit 11/21/2018. Compliance with diet, lifestyle and medications: Yes, however she did add salt to her diet she coughs when she is supine at night her  weight is up 3 pounds and will increase the dose of her diuretic.  She is unaware she has trace edema no exertional shortness of breath chest pain palpitation or syncope.  Once or twice a month she notices resting heart rate in the mid 40s feels weak with it but is much improved since beta-blocker is discontinued.  She wore a 14-day monitor through her PCP was told the results were good and I will request to review what it was done this summer.  I reassured her at this time I do not think she needs a pacemaker. Past Medical History:  Diagnosis Date  . Acute kidney injury ([place]) 09/04/2015  . Altered awareness, transient 02/08/2012  . Arthritis    OA AND PAIN LEFT KNEE  AND ARTHRITIS IN FINGERS  . Cancer (HCC)    UTERINE CANCER - S/P HYSTERECTOMY  . CHF exacerbation ([place]) 02/08/2012  . Chronic diastolic CHF (congestive heart failure) ([place]) 09/04/2015  . Coronary artery disease    Followed by Dr.Munley  . Coronary atherosclerosis of native coronary artery 02/08/2012  . Diabetes mellitus   . Essential hypertension, benign 02/08/2012  . GERD (gastroesophageal reflux disease)   . Hypercalcemia 09/04/2015  . Hyperlipidemia   . Hypertension   . Left hip pain 09/04/2015  . Leukocytosis 09/04/2015  . Mixed hyperlipidemia 02/08/2012  . Myocardial infarction ([place]) JULY 2006   [place]  . SIRS (systemic inflammatory response syndrome) (HCC)  09/04/2015  . Sleep apnea    PT HAS CPAP MASK AND MACHINE AT HOME--BUT DOES NOT USE-COULD NOT TOLERATE  . Type 2 diabetes mellitus with circulatory disorder ([place]) 09/04/2015    Past Surgical History:  Procedure Laterality Date  . ABDOMINAL HYSTERECTOMY  2000   UTERINE CANCER  . BILATERAL CATARACT EXTRACTIONS  2005  . CORONARY ANGIOPLASTY    . LEFT KNEE ARTHROSCOPY  2012  . TOTAL KNEE ARTHROPLASTY  02/05/2012   Procedure: TOTAL KNEE ARTHROPLASTY;  Surgeon: Mauri Pole, MD;  Location: [facility];  Service: Orthopedics;  Laterality: Left;     Current Medications: Current Meds  Medication Sig  . acetaminophen (TYLENOL) 500 MG tablet Take 1,000 mg by mouth every 4 (four) hours as needed.   Marland Kitchen amLODipine (NORVASC) 10 MG tablet TK 1 T PO D  . aspirin EC 81 MG tablet Take 162 mg by mouth every morning.  . Calcium-Vitamin D (CALTRATE 600 PLUS-VIT D PO) Take 1 tablet by mouth every morning.   . canagliflozin (INVOKANA) 100 MG TABS tablet Take 50 mg by mouth daily.  . Candesartan Cilexetil-HCTZ 32-25 MG TABS Take 1 tablet once a day for blood pressure and heart  . Cyanocobalamin (VITAMIN B-12) 5000 MCG SUBL Place 1 mcg under the tongue daily.  . furosemide (LASIX) 20 MG tablet Take 10 mg by mouth every other day. Take additional dose on days weight is greater than 171lbs  . glimepiride (AMARYL) 4 MG tablet Take 4 mg by mouth daily with breakfast.   . isosorbide mononitrate (IMDUR) 60 MG 24 hr tablet Take 1 tablet (60 mg total) by mouth daily.  . metFORMIN (GLUCOPHAGE) 1000 MG tablet Take 1,000 mg by mouth 2 (two) times daily with a meal.  . Multiple Vitamin (MULTIVITAMIN WITH MINERALS) TABS tablet Take 1 tablet by mouth daily.  . nitroGLYCERIN (NITROSTAT) 0.4 MG SL tablet Place 1 tablet (0.4 mg total) under the tongue every 5 (five) minutes as needed for chest pain.  [name] DELICA LANCETS FINE MISC   . pantoprazole (PROTONIX) 40 MG tablet Take 40 mg by mouth daily.  . pravastatin (PRAVACHOL) 40 MG tablet TAKE 1 TABLET BY MOUTH EVERY DAY  . sitaGLIPtin (JANUVIA) 50 MG tablet Take 1 tablet once a day for diabetes  . triamcinolone cream (KENALOG) 0.1 % apply to affected area ON FACE twice a day for 5 to 7 days     Allergies:   Prednisone and Hydralazine hcl   Social History   Socioeconomic History  . Marital status: Married    Spouse name: Not on file  . Number of children: Not on file  . Years of education: Not on file  . Highest education level: Not on file  Occupational History  . Not on file  Social Needs  . Financial  resource strain: Not on file  . Food insecurity    Worry: Not on file    Inability: Not on file  . Transportation needs    Medical: Not on file    Non-medical: Not on file  Tobacco Use  . Smoking status: Never Smoker  . Smokeless tobacco: Never Used  Substance and Sexual Activity  . Alcohol use: No  . Drug use: No  . Sexual activity: Not Currently  Lifestyle  . Physical activity    Days per week: Not on file    Minutes per session: Not on file  . Stress: Not on file  Relationships  . Social connections    Talks  on phone: Not on file    Gets together: Not on file    Attends religious service: Not on file    Active member of club or organization: Not on file    Attends meetings of clubs or organizations: Not on file    Relationship status: Not on file  Other Topics Concern  . Not on file  Social History Narrative  . Not on file     Family History: The patient's family history includes Atrial fibrillation in an other family member; Hypertension in an other family member. ROS:   Please see the history of present illness.    All other systems reviewed and are negative.  EKGs/Labs/Other Studies Reviewed:    The following studies were reviewed today:  EKG:  EKG ordered today and personally reviewed.  The ekg ordered today demonstrates sinus rhythm normal QRS duration PR interval old anterior septal MI lateral T wave inversion.  Recent Labs: 03/18/2019, hemoglobin A1c 8.3%, CMP potassium 4.6, creatinine 1.27, GFR 40 cc/min, and mildly elevated AST ALT less than 2 times normal 03/18/2019, cholesterol 138 HDL 33 LDL 86 triglyceride 165 Hemoglobin 14.0 June 2020  Physical Exam:    VS:  BP (!) 162/74 (BP Location: Right Arm, Patient Position: Sitting, Cuff Size: Normal)   Pulse 88   Temp (!) 97.3 F (36.3 C)   Ht 5\' 1"  (1.549 m)   Wt 171 lb (77.6 kg)   SpO2 97%   BMI 32.31 kg/m     Wt Readings from Last 3 Encounters:  05/05/19 171 lb (77.6 kg)  11/21/18 169 lb 4.8  oz (76.8 kg)  08/22/18 173 lb (78.5 kg)     GEN:  Well nourished, well developed in no acute distress HEENT: Normal NECK: No JVD; No carotid bruits LYMPHATICS: No lymphadenopathy CARDIAC: RRR, no murmurs, rubs, gallops RESPIRATORY:  Clear to auscultation without rales, wheezing or rhonchi  ABDOMEN: Soft, non-tender, non-distended MUSCULOSKELETAL:  No edema; No deformity  SKIN: Warm and dry NEUROLOGIC:  Alert and oriented x 3 PSYCHIATRIC:  Normal affect    Signed, Shirlee More, MD  05/05/2019 9:41 AM    [place]

## 2019-05-05 ENCOUNTER — Ambulatory Visit (INDEPENDENT_AMBULATORY_CARE_PROVIDER_SITE_OTHER): Payer: Self-pay | Admitting: Cardiology

## 2019-05-05 ENCOUNTER — Other Ambulatory Visit: Payer: Self-pay

## 2019-05-05 ENCOUNTER — Encounter: Payer: Self-pay | Admitting: Cardiology

## 2019-05-05 VITALS — BP 162/74 | HR 88 | Temp 97.3°F | Ht 61.0 in | Wt 171.0 lb

## 2019-05-05 DIAGNOSIS — R001 Bradycardia, unspecified: Secondary | ICD-10-CM

## 2019-05-05 DIAGNOSIS — D508 Other iron deficiency anemias: Secondary | ICD-10-CM

## 2019-05-05 DIAGNOSIS — I25119 Atherosclerotic heart disease of native coronary artery with unspecified angina pectoris: Secondary | ICD-10-CM

## 2019-05-05 DIAGNOSIS — E782 Mixed hyperlipidemia: Secondary | ICD-10-CM

## 2019-05-05 DIAGNOSIS — I110 Hypertensive heart disease with heart failure: Secondary | ICD-10-CM

## 2019-05-05 MED ORDER — FUROSEMIDE 20 MG PO TABS: 20.0000 mg | ORAL_TABLET | ORAL | 1 refills | Status: DC

## 2019-05-05 NOTE — Patient Instructions (Signed)
Medication Instructions:  Your physician has recommended you make the following change in your medication:  INCREASE furosemide (lasix) 20 mg: Take 1 tablet every other day. Take 1 extra tablet if your weight is greater than 171 lbs.   If you need a refill on your cardiac medications before your next appointment, please call your pharmacy.   Lab work: None  If you have labs (blood work) drawn today and your tests are completely normal, you will receive your results only by: Marland Kitchen MyChart Message (if you have MyChart) OR . A paper copy in the mail If you have any lab test that is abnormal or we need to change your treatment, we will call you to review the results.  Testing/Procedures: None  **Requesting a copy of the 14 day monitor you wore from your PCP office.   Follow-Up: At [facility], you and your health needs are our priority.  As part of our continuing mission to provide you with exceptional heart care, we have created designated Provider Care Teams.  These Care Teams include your primary Cardiologist (physician) and Advanced Practice Providers (APPs -  Physician Assistants and Nurse Practitioners) who all work together to provide you with the care you need, when you need it. You will need a follow up appointment in 6 months.  Please call our office 2 months in advance to schedule this appointment.

## 2019-05-09 DIAGNOSIS — R300 Dysuria: Secondary | ICD-10-CM | POA: Diagnosis not present

## 2019-05-09 DIAGNOSIS — Z6833 Body mass index (BMI) 33.0-33.9, adult: Secondary | ICD-10-CM | POA: Diagnosis not present

## 2019-05-09 DIAGNOSIS — N760 Acute vaginitis: Secondary | ICD-10-CM | POA: Diagnosis not present

## 2019-06-03 DIAGNOSIS — K219 Gastro-esophageal reflux disease without esophagitis: Secondary | ICD-10-CM | POA: Diagnosis not present

## 2019-06-03 DIAGNOSIS — I509 Heart failure, unspecified: Secondary | ICD-10-CM | POA: Diagnosis not present

## 2019-06-03 DIAGNOSIS — E669 Obesity, unspecified: Secondary | ICD-10-CM | POA: Diagnosis not present

## 2019-06-03 DIAGNOSIS — D509 Iron deficiency anemia, unspecified: Secondary | ICD-10-CM | POA: Diagnosis not present

## 2019-06-03 DIAGNOSIS — Z6833 Body mass index (BMI) 33.0-33.9, adult: Secondary | ICD-10-CM | POA: Diagnosis not present

## 2019-06-03 DIAGNOSIS — E785 Hyperlipidemia, unspecified: Secondary | ICD-10-CM | POA: Diagnosis not present

## 2019-06-03 DIAGNOSIS — I10 Essential (primary) hypertension: Secondary | ICD-10-CM | POA: Diagnosis not present

## 2019-06-03 DIAGNOSIS — E1159 Type 2 diabetes mellitus with other circulatory complications: Secondary | ICD-10-CM | POA: Diagnosis not present

## 2019-06-06 DIAGNOSIS — I509 Heart failure, unspecified: Secondary | ICD-10-CM | POA: Diagnosis not present

## 2019-06-06 DIAGNOSIS — I350 Nonrheumatic aortic (valve) stenosis: Secondary | ICD-10-CM | POA: Diagnosis not present

## 2019-06-30 DIAGNOSIS — J069 Acute upper respiratory infection, unspecified: Secondary | ICD-10-CM | POA: Diagnosis not present

## 2019-07-02 ENCOUNTER — Other Ambulatory Visit: Payer: Self-pay

## 2019-07-02 MED ORDER — FUROSEMIDE 20 MG PO TABS: 20.0000 mg | ORAL_TABLET | ORAL | 1 refills | Status: DC

## 2019-07-07 DIAGNOSIS — B9689 Other specified bacterial agents as the cause of diseases classified elsewhere: Secondary | ICD-10-CM | POA: Diagnosis not present

## 2019-07-07 DIAGNOSIS — J208 Acute bronchitis due to other specified organisms: Secondary | ICD-10-CM | POA: Diagnosis not present

## 2019-07-07 DIAGNOSIS — J019 Acute sinusitis, unspecified: Secondary | ICD-10-CM | POA: Diagnosis not present

## 2019-07-07 DIAGNOSIS — R001 Bradycardia, unspecified: Secondary | ICD-10-CM | POA: Diagnosis not present

## 2019-07-07 DIAGNOSIS — I10 Essential (primary) hypertension: Secondary | ICD-10-CM | POA: Diagnosis not present

## 2019-07-07 DIAGNOSIS — D509 Iron deficiency anemia, unspecified: Secondary | ICD-10-CM | POA: Diagnosis not present

## 2019-07-10 ENCOUNTER — Other Ambulatory Visit: Payer: Self-pay

## 2019-07-10 MED ORDER — FUROSEMIDE 20 MG PO TABS: 20.0000 mg | ORAL_TABLET | ORAL | 1 refills | Status: DC

## 2019-07-14 DIAGNOSIS — E1165 Type 2 diabetes mellitus with hyperglycemia: Secondary | ICD-10-CM | POA: Diagnosis not present

## 2019-07-14 DIAGNOSIS — E782 Mixed hyperlipidemia: Secondary | ICD-10-CM | POA: Diagnosis not present

## 2019-07-21 DIAGNOSIS — E782 Mixed hyperlipidemia: Secondary | ICD-10-CM | POA: Diagnosis not present

## 2019-07-21 DIAGNOSIS — E1122 Type 2 diabetes mellitus with diabetic chronic kidney disease: Secondary | ICD-10-CM | POA: Diagnosis not present

## 2019-07-21 DIAGNOSIS — G4730 Sleep apnea, unspecified: Secondary | ICD-10-CM | POA: Diagnosis not present

## 2019-07-21 DIAGNOSIS — I2510 Atherosclerotic heart disease of native coronary artery without angina pectoris: Secondary | ICD-10-CM | POA: Diagnosis not present

## 2019-07-21 DIAGNOSIS — I130 Hypertensive heart and chronic kidney disease with heart failure and stage 1 through stage 4 chronic kidney disease, or unspecified chronic kidney disease: Secondary | ICD-10-CM | POA: Diagnosis not present

## 2019-07-21 DIAGNOSIS — I5032 Chronic diastolic (congestive) heart failure: Secondary | ICD-10-CM | POA: Diagnosis not present

## 2019-07-21 DIAGNOSIS — N1831 Chronic kidney disease, stage 3a: Secondary | ICD-10-CM

## 2019-07-21 HISTORY — DX: Chronic kidney disease, stage 3a: N18.31

## 2019-07-28 DIAGNOSIS — L718 Other rosacea: Secondary | ICD-10-CM | POA: Diagnosis not present

## 2019-08-22 DIAGNOSIS — H6981 Other specified disorders of Eustachian tube, right ear: Secondary | ICD-10-CM | POA: Diagnosis not present

## 2019-08-26 DIAGNOSIS — Z961 Presence of intraocular lens: Secondary | ICD-10-CM | POA: Diagnosis not present

## 2019-08-26 DIAGNOSIS — H5213 Myopia, bilateral: Secondary | ICD-10-CM | POA: Diagnosis not present

## 2019-08-26 DIAGNOSIS — Z7984 Long term (current) use of oral hypoglycemic drugs: Secondary | ICD-10-CM | POA: Diagnosis not present

## 2019-08-26 DIAGNOSIS — H524 Presbyopia: Secondary | ICD-10-CM | POA: Diagnosis not present

## 2019-08-26 DIAGNOSIS — H52203 Unspecified astigmatism, bilateral: Secondary | ICD-10-CM | POA: Diagnosis not present

## 2019-08-26 DIAGNOSIS — E119 Type 2 diabetes mellitus without complications: Secondary | ICD-10-CM | POA: Diagnosis not present

## 2019-09-01 DIAGNOSIS — K219 Gastro-esophageal reflux disease without esophagitis: Secondary | ICD-10-CM | POA: Diagnosis not present

## 2019-09-01 DIAGNOSIS — E785 Hyperlipidemia, unspecified: Secondary | ICD-10-CM | POA: Diagnosis not present

## 2019-09-01 DIAGNOSIS — D509 Iron deficiency anemia, unspecified: Secondary | ICD-10-CM | POA: Diagnosis not present

## 2019-09-01 DIAGNOSIS — E1159 Type 2 diabetes mellitus with other circulatory complications: Secondary | ICD-10-CM | POA: Diagnosis not present

## 2019-09-01 DIAGNOSIS — I509 Heart failure, unspecified: Secondary | ICD-10-CM | POA: Diagnosis not present

## 2019-09-01 DIAGNOSIS — R001 Bradycardia, unspecified: Secondary | ICD-10-CM | POA: Diagnosis not present

## 2019-09-01 DIAGNOSIS — I10 Essential (primary) hypertension: Secondary | ICD-10-CM | POA: Diagnosis not present

## 2019-09-01 DIAGNOSIS — Z6832 Body mass index (BMI) 32.0-32.9, adult: Secondary | ICD-10-CM | POA: Diagnosis not present

## 2019-09-08 ENCOUNTER — Other Ambulatory Visit: Payer: Self-pay | Admitting: Cardiology

## 2019-09-09 ENCOUNTER — Other Ambulatory Visit: Payer: Self-pay | Admitting: Cardiology

## 2019-11-13 DIAGNOSIS — N1831 Chronic kidney disease, stage 3a: Secondary | ICD-10-CM | POA: Diagnosis not present

## 2019-11-13 DIAGNOSIS — E1165 Type 2 diabetes mellitus with hyperglycemia: Secondary | ICD-10-CM | POA: Diagnosis not present

## 2019-11-17 DIAGNOSIS — I5032 Chronic diastolic (congestive) heart failure: Secondary | ICD-10-CM | POA: Diagnosis not present

## 2019-11-17 DIAGNOSIS — E1122 Type 2 diabetes mellitus with diabetic chronic kidney disease: Secondary | ICD-10-CM | POA: Diagnosis not present

## 2019-11-17 DIAGNOSIS — I2510 Atherosclerotic heart disease of native coronary artery without angina pectoris: Secondary | ICD-10-CM | POA: Diagnosis not present

## 2019-11-17 DIAGNOSIS — G4730 Sleep apnea, unspecified: Secondary | ICD-10-CM | POA: Diagnosis not present

## 2019-11-17 DIAGNOSIS — E1165 Type 2 diabetes mellitus with hyperglycemia: Secondary | ICD-10-CM | POA: Diagnosis not present

## 2019-11-17 DIAGNOSIS — E782 Mixed hyperlipidemia: Secondary | ICD-10-CM | POA: Diagnosis not present

## 2019-11-17 DIAGNOSIS — N1831 Chronic kidney disease, stage 3a: Secondary | ICD-10-CM | POA: Diagnosis not present

## 2019-11-17 DIAGNOSIS — I130 Hypertensive heart and chronic kidney disease with heart failure and stage 1 through stage 4 chronic kidney disease, or unspecified chronic kidney disease: Secondary | ICD-10-CM | POA: Diagnosis not present

## 2019-12-01 DIAGNOSIS — I10 Essential (primary) hypertension: Secondary | ICD-10-CM | POA: Diagnosis not present

## 2019-12-01 DIAGNOSIS — Z0000 Encounter for general adult medical examination without abnormal findings: Secondary | ICD-10-CM | POA: Diagnosis not present

## 2019-12-01 DIAGNOSIS — R001 Bradycardia, unspecified: Secondary | ICD-10-CM | POA: Diagnosis not present

## 2019-12-01 DIAGNOSIS — E785 Hyperlipidemia, unspecified: Secondary | ICD-10-CM | POA: Diagnosis not present

## 2019-12-01 DIAGNOSIS — Z6832 Body mass index (BMI) 32.0-32.9, adult: Secondary | ICD-10-CM | POA: Diagnosis not present

## 2019-12-01 DIAGNOSIS — E1159 Type 2 diabetes mellitus with other circulatory complications: Secondary | ICD-10-CM | POA: Diagnosis not present

## 2019-12-01 DIAGNOSIS — Z139 Encounter for screening, unspecified: Secondary | ICD-10-CM | POA: Diagnosis not present

## 2019-12-01 DIAGNOSIS — I509 Heart failure, unspecified: Secondary | ICD-10-CM | POA: Diagnosis not present

## 2019-12-01 DIAGNOSIS — D509 Iron deficiency anemia, unspecified: Secondary | ICD-10-CM | POA: Diagnosis not present

## 2019-12-01 DIAGNOSIS — Z9181 History of falling: Secondary | ICD-10-CM | POA: Diagnosis not present

## 2019-12-01 DIAGNOSIS — Z1331 Encounter for screening for depression: Secondary | ICD-10-CM | POA: Diagnosis not present

## 2019-12-01 DIAGNOSIS — K219 Gastro-esophageal reflux disease without esophagitis: Secondary | ICD-10-CM | POA: Diagnosis not present

## 2019-12-10 ENCOUNTER — Other Ambulatory Visit: Payer: Self-pay

## 2019-12-10 NOTE — Progress Notes (Signed)
Cardiology Office Note:    Date:  12/11/2019   ID:  Lindsey Frost, DOB Feb 06, 1941, MRN [id number]  PCP:  Lowella Dandy, NP  Cardiologist:  Shirlee More, MD    Referring MD: Lowella Dandy, NP    ASSESSMENT:    1. Coronary artery disease involving native coronary artery of native heart with angina pectoris ([place])   2. Hypertensive heart disease with heart failure (HCC)   3. Other iron deficiency anemia   4. Mixed hyperlipidemia   5. Bradycardia with 41-50 beats per minute    PLAN:    In order of problems listed above:  1. Stable CAD having no angina remote PCI no current medical treatment she has New York Heart Association class I at this time would not advise her to have an ischemic evaluation.  She will continue treatment including aspirin oral nitrate calcium channel blocker nonrate limiting statin. 2. Stable blood pressure target continue treatment including ARB and no symptoms or findings of heart failure at this time continue her current small dose of loop diuretic 3. Improved managed by her family doctor 4. Stable continue her statin 5. Improved not having bradycardia avoid rate limiting medications   Next appointment: 6 months   Medication Adjustments/Labs and Tests Ordered: Current medicines are reviewed at length with the patient today.  Concerns regarding medicines are outlined above.  No orders of the defined types were placed in this encounter.  No orders of the defined types were placed in this encounter.   No chief complaint on file.   History of Present Illness:    Lindsey Maegen Senft is a 79 y.o. female with a hx of sinus bradycardia ,CAD and PCI and Taxus stent to LAD 2006 , Dyslipidemia, HTN iron deficiency anemia and diastolic heart failure last seen 05/05/2019. Compliance with diet, lifestyle and medications: Yes  She is frustrated last visit with endocrinology A1c 8.8 and her dose of Metformin with decreased.  I told her at times there is a better  result using Glucophage Exar and we will switch her from Januvia Metformin to Glucophage XR to try to facilitate her diabetic treatment and strongly encouraged her to lose 20 to 30 pounds.  She is not having heart rates less than 50 at home blood pressures generally less than January 2021 09/01/2019 shows a creatinine of 1.19.  Systolic and her weight is stable.  She told me in the last few months her hemoglobin was normal the last labs I can see and the last labs available 09/01/2019 shows a creatinine of 1.19.  EKG last visit showed sinus rhythm stable I did not repeat today.  She has had no chest pain edema orthopnea or syncope. Past Medical History:  Diagnosis Date  . Acute kidney injury ([place]) 09/04/2015  . Altered awareness, transient 02/08/2012  . Anemia 12/08/2016  . Arthritis    OA AND PAIN LEFT KNEE  AND ARTHRITIS IN FINGERS  . Atherosclerotic heart disease of native coronary artery without angina pectoris 02/08/2012   Overview:  Overview:  PCI and Taxus stent to LAD 2006  Formatting of this note might be different from the original. PCI and Taxus stent to LAD 2006  . Bradycardia with 41-50 beats per minute 08/22/2018  . Cancer (HCC)    UTERINE CANCER - S/P HYSTERECTOMY  . CHF exacerbation ([place]) 02/08/2012  . Chronic diastolic CHF (congestive heart failure) ([place]) 09/04/2015  . Chronic diastolic heart failure ([place]) 02/08/2012  . Coronary artery disease  Followed by Dr.Jobany Montellano  . Coronary atherosclerosis of native coronary artery 02/08/2012  . Diabetes mellitus   . Essential hypertension, benign 02/08/2012  . ETD (Eustachian tube dysfunction), right 01/01/2018  . Frequent PVCs 12/08/2016  . GERD (gastroesophageal reflux disease)   . Hypercalcemia 09/04/2015  . Hyperlipidemia   . Hypertension   . Hypertensive heart disease with heart failure ([place]) 12/19/2015  . Left hip pain 09/04/2015  . Leukocytosis 09/04/2015  . LVH (left ventricular hypertrophy) due to hypertensive disease, with heart failure ([place])  12/08/2016  . Mixed hyperlipidemia 02/08/2012  . Myocardial infarction ([place]) JULY 2006   [place]  . Occult blood positive stool 12/08/2016  . OSA (obstructive sleep apnea) 12/08/2016  . Peripheral edema 12/08/2016  . S/P left TKA 02/06/2012  . SIRS (systemic inflammatory response syndrome) ([place]) 09/04/2015  . Sleep apnea    PT HAS CPAP MASK AND MACHINE AT HOME--BUT DOES NOT USE-COULD NOT TOLERATE  . Stage 3a chronic kidney disease 07/21/2019  . Type 2 diabetes mellitus with circulatory disorder ([place]) 09/04/2015    Past Surgical History:  Procedure Laterality Date  . ABDOMINAL HYSTERECTOMY  2000   UTERINE CANCER  . BILATERAL CATARACT EXTRACTIONS  2005  . CORONARY ANGIOPLASTY    . LEFT KNEE ARTHROSCOPY  2012  . TOTAL KNEE ARTHROPLASTY  02/05/2012   Procedure: TOTAL KNEE ARTHROPLASTY;  Surgeon: Mauri Pole, MD;  Location: [facility];  Service: Orthopedics;  Laterality: Left;    Current Medications: No outpatient medications have been marked as taking for the 12/11/19 encounter (Appointment) with Richardo Priest, MD.     Allergies:   Prednisone and Hydralazine hcl   Social History   Socioeconomic History  . Marital status: Married    Spouse name: Not on file  . Number of children: Not on file  . Years of education: Not on file  . Highest education level: Not on file  Occupational History  . Not on file  Tobacco Use  . Smoking status: Never Smoker  . Smokeless tobacco: Never Used  Substance and Sexual Activity  . Alcohol use: No  . Drug use: No  . Sexual activity: Not Currently  Other Topics Concern  . Not on file  Social History Narrative  . Not on file   Social Determinants of Health   Financial Resource Strain:   . Difficulty of Paying Living Expenses:   Food Insecurity:   . Worried About Charity fundraiser in the Last Year:   . Arboriculturist in the Last Year:   Transportation Needs:   . Film/video editor (Medical):   Marland Kitchen Lack of  Transportation (Non-Medical):   Physical Activity:   . Days of Exercise per Week:   . Minutes of Exercise per Session:   Stress:   . Feeling of Stress :   Social Connections:   . Frequency of Communication with Friends and Family:   . Frequency of Social Gatherings with Friends and Family:   . Attends Religious Services:   . Active Member of Clubs or Organizations:   . Attends Archivist Meetings:   Marland Kitchen Marital Status:      Family History: The patient's family history includes Atrial fibrillation in an other family member; Hypertension in an other family member. ROS:   Please see the history of present illness.    All other systems reviewed and are negative.  EKGs/Labs/Other Studies Reviewed:    The following studies were reviewed today:  Recent Labs: 03/18/2019, hemoglobin A1c 8.3%, CMP potassium 4.6, creatinine 1.27, GFR 40 cc/min, and mildly elevated AST ALT less than 2 times normal 03/18/2019, cholesterol 138 HDL 33 LDL 86 triglyceride 165 Hemoglobin 14.0 June 2020  Physical Exam:    VS:  There were no vitals taken for this visit.    Wt Readings from Last 3 Encounters:  05/05/19 171 lb (77.6 kg)  11/21/18 169 lb 4.8 oz (76.8 kg)  08/22/18 173 lb (78.5 kg)    Peak blood pressure by me 138/80 GEN:  Well nourished, well developed in no acute distress HEENT: Normal NECK: No JVD; No carotid bruits LYMPHATICS: No lymphadenopathy CARDIAC: RRR, no murmurs, rubs, gallops RESPIRATORY:  Clear to auscultation without rales, wheezing or rhonchi  ABDOMEN: Soft, non-tender, non-distended MUSCULOSKELETAL:  No edema; No deformity  SKIN: Warm and dry NEUROLOGIC:  Alert and oriented x 3 PSYCHIATRIC:  Normal affect    Signed, Shirlee More, MD  12/11/2019 8:26 AM    [place]

## 2019-12-11 ENCOUNTER — Other Ambulatory Visit: Payer: Self-pay

## 2019-12-11 ENCOUNTER — Encounter: Payer: Self-pay | Admitting: Cardiology

## 2019-12-11 ENCOUNTER — Ambulatory Visit: Payer: PPO | Admitting: Cardiology

## 2019-12-11 VITALS — BP 148/64 | HR 94 | Temp 97.5°F | Ht 60.0 in | Wt 163.2 lb

## 2019-12-11 DIAGNOSIS — R001 Bradycardia, unspecified: Secondary | ICD-10-CM

## 2019-12-11 DIAGNOSIS — D508 Other iron deficiency anemias: Secondary | ICD-10-CM | POA: Diagnosis not present

## 2019-12-11 DIAGNOSIS — E782 Mixed hyperlipidemia: Secondary | ICD-10-CM | POA: Diagnosis not present

## 2019-12-11 DIAGNOSIS — I110 Hypertensive heart disease with heart failure: Secondary | ICD-10-CM | POA: Diagnosis not present

## 2019-12-11 DIAGNOSIS — I25119 Atherosclerotic heart disease of native coronary artery with unspecified angina pectoris: Secondary | ICD-10-CM

## 2019-12-11 MED ORDER — JANUMET 50-1000 MG PO TABS
1.0000 | ORAL_TABLET | Freq: Every day | ORAL | 3 refills | Status: DC
Start: 2019-12-11 — End: 2020-10-27

## 2019-12-11 MED ORDER — NITROGLYCERIN 0.4 MG SL SUBL: 0.4000 mg | SUBLINGUAL_TABLET | SUBLINGUAL | 1 refills | Status: DC | PRN

## 2019-12-11 NOTE — Patient Instructions (Signed)
Medication Instructions:  Your physician has recommended you make the following change in your medication:  STOP Metformin STOP Januvia START: Janumet 50-1000 mg take one tablet by mouth daily.  *If you need a refill on your cardiac medications before your next appointment, please call your pharmacy*   Lab Work: None If you have labs (blood work) drawn today and your tests are completely normal, you will receive your results only by: Marland Kitchen MyChart Message (if you have MyChart) OR . A paper copy in the mail If you have any lab test that is abnormal or we need to change your treatment, we will call you to review the results.   Testing/Procedures: None   Follow-Up: At [facility], you and your health needs are our priority.  As part of our continuing mission to provide you with exceptional heart care, we have created designated Provider Care Teams.  These Care Teams include your primary Cardiologist (physician) and Advanced Practice Providers (APPs -  Physician Assistants and Nurse Practitioners) who all work together to provide you with the care you need, when you need it.  We recommend signing up for the patient portal called "MyChart".  Sign up information is provided on this After Visit Summary.  MyChart is used to connect with patients for Virtual Visits (Telemedicine).  Patients are able to view lab/test results, encounter notes, upcoming appointments, etc.  Non-urgent messages can be sent to your provider as well.   To learn more about what you can do with MyChart, go to [email or web address].    Your next appointment:   6 month(s)  The format for your next appointment:   In Person  Provider:   Shirlee More, MD   Other Instructions

## 2019-12-24 DIAGNOSIS — D126 Benign neoplasm of colon, unspecified: Secondary | ICD-10-CM

## 2019-12-24 DIAGNOSIS — M810 Age-related osteoporosis without current pathological fracture: Secondary | ICD-10-CM | POA: Insufficient documentation

## 2019-12-24 HISTORY — DX: Benign neoplasm of colon, unspecified: D12.6

## 2019-12-24 HISTORY — DX: Age-related osteoporosis without current pathological fracture: M81.0

## 2019-12-26 DIAGNOSIS — C55 Malignant neoplasm of uterus, part unspecified: Secondary | ICD-10-CM | POA: Insufficient documentation

## 2019-12-26 DIAGNOSIS — K449 Diaphragmatic hernia without obstruction or gangrene: Secondary | ICD-10-CM

## 2019-12-26 DIAGNOSIS — M810 Age-related osteoporosis without current pathological fracture: Secondary | ICD-10-CM | POA: Diagnosis not present

## 2019-12-26 DIAGNOSIS — Z124 Encounter for screening for malignant neoplasm of cervix: Secondary | ICD-10-CM | POA: Diagnosis not present

## 2019-12-26 DIAGNOSIS — Z1231 Encounter for screening mammogram for malignant neoplasm of breast: Secondary | ICD-10-CM | POA: Diagnosis not present

## 2019-12-26 DIAGNOSIS — Z6831 Body mass index (BMI) 31.0-31.9, adult: Secondary | ICD-10-CM | POA: Diagnosis not present

## 2019-12-26 DIAGNOSIS — Z779 Other contact with and (suspected) exposures hazardous to health: Secondary | ICD-10-CM | POA: Diagnosis not present

## 2019-12-26 HISTORY — DX: Malignant neoplasm of uterus, part unspecified: C55

## 2019-12-26 HISTORY — DX: Diaphragmatic hernia without obstruction or gangrene: K44.9

## 2019-12-30 ENCOUNTER — Other Ambulatory Visit: Payer: Self-pay | Admitting: Obstetrics and Gynecology

## 2019-12-30 DIAGNOSIS — R9389 Abnormal findings on diagnostic imaging of other specified body structures: Secondary | ICD-10-CM

## 2020-01-07 ENCOUNTER — Other Ambulatory Visit: Payer: Self-pay | Admitting: Obstetrics and Gynecology

## 2020-01-07 DIAGNOSIS — R928 Other abnormal and inconclusive findings on diagnostic imaging of breast: Secondary | ICD-10-CM

## 2020-01-09 ENCOUNTER — Other Ambulatory Visit: Payer: Self-pay | Admitting: Obstetrics and Gynecology

## 2020-01-09 ENCOUNTER — Other Ambulatory Visit: Payer: Self-pay

## 2020-01-09 ENCOUNTER — Ambulatory Visit
Admission: RE | Admit: 2020-01-09 | Discharge: 2020-01-09 | Disposition: A | Discharge status: Home / Self Care | Payer: PPO | Source: Ambulatory Visit | Attending: Obstetrics and Gynecology | Admitting: Obstetrics and Gynecology

## 2020-01-09 DIAGNOSIS — R928 Other abnormal and inconclusive findings on diagnostic imaging of breast: Secondary | ICD-10-CM | POA: Diagnosis not present

## 2020-02-06 DIAGNOSIS — M810 Age-related osteoporosis without current pathological fracture: Secondary | ICD-10-CM | POA: Diagnosis not present

## 2020-02-16 DIAGNOSIS — H6981 Other specified disorders of Eustachian tube, right ear: Secondary | ICD-10-CM | POA: Diagnosis not present

## 2020-03-05 ENCOUNTER — Other Ambulatory Visit: Payer: Self-pay | Admitting: Cardiology

## 2020-03-08 DIAGNOSIS — I10 Essential (primary) hypertension: Secondary | ICD-10-CM | POA: Diagnosis not present

## 2020-03-08 DIAGNOSIS — E785 Hyperlipidemia, unspecified: Secondary | ICD-10-CM | POA: Diagnosis not present

## 2020-03-08 DIAGNOSIS — N3281 Overactive bladder: Secondary | ICD-10-CM | POA: Diagnosis not present

## 2020-03-08 DIAGNOSIS — E1159 Type 2 diabetes mellitus with other circulatory complications: Secondary | ICD-10-CM | POA: Diagnosis not present

## 2020-03-08 DIAGNOSIS — Z6832 Body mass index (BMI) 32.0-32.9, adult: Secondary | ICD-10-CM | POA: Diagnosis not present

## 2020-03-08 DIAGNOSIS — D509 Iron deficiency anemia, unspecified: Secondary | ICD-10-CM | POA: Diagnosis not present

## 2020-03-08 DIAGNOSIS — K219 Gastro-esophageal reflux disease without esophagitis: Secondary | ICD-10-CM | POA: Diagnosis not present

## 2020-03-08 DIAGNOSIS — I509 Heart failure, unspecified: Secondary | ICD-10-CM | POA: Diagnosis not present

## 2020-03-17 DIAGNOSIS — R69 Illness, unspecified: Secondary | ICD-10-CM | POA: Diagnosis not present

## 2020-03-22 DIAGNOSIS — E1122 Type 2 diabetes mellitus with diabetic chronic kidney disease: Secondary | ICD-10-CM | POA: Diagnosis not present

## 2020-03-22 DIAGNOSIS — I5032 Chronic diastolic (congestive) heart failure: Secondary | ICD-10-CM | POA: Diagnosis not present

## 2020-03-22 DIAGNOSIS — G4730 Sleep apnea, unspecified: Secondary | ICD-10-CM | POA: Diagnosis not present

## 2020-03-22 DIAGNOSIS — N1831 Chronic kidney disease, stage 3a: Secondary | ICD-10-CM | POA: Diagnosis not present

## 2020-03-22 DIAGNOSIS — I2510 Atherosclerotic heart disease of native coronary artery without angina pectoris: Secondary | ICD-10-CM | POA: Diagnosis not present

## 2020-03-22 DIAGNOSIS — E782 Mixed hyperlipidemia: Secondary | ICD-10-CM | POA: Diagnosis not present

## 2020-03-22 DIAGNOSIS — I130 Hypertensive heart and chronic kidney disease with heart failure and stage 1 through stage 4 chronic kidney disease, or unspecified chronic kidney disease: Secondary | ICD-10-CM | POA: Diagnosis not present

## 2020-03-22 DIAGNOSIS — E1165 Type 2 diabetes mellitus with hyperglycemia: Secondary | ICD-10-CM | POA: Diagnosis not present

## 2020-06-25 DIAGNOSIS — C801 Malignant (primary) neoplasm, unspecified: Secondary | ICD-10-CM | POA: Insufficient documentation

## 2020-06-25 DIAGNOSIS — M1990 Unspecified osteoarthritis, unspecified site: Secondary | ICD-10-CM | POA: Insufficient documentation

## 2020-06-25 DIAGNOSIS — E785 Hyperlipidemia, unspecified: Secondary | ICD-10-CM | POA: Insufficient documentation

## 2020-06-25 DIAGNOSIS — I10 Essential (primary) hypertension: Secondary | ICD-10-CM | POA: Insufficient documentation

## 2020-06-25 DIAGNOSIS — I2510 Atherosclerotic heart disease of native coronary artery without angina pectoris: Secondary | ICD-10-CM | POA: Insufficient documentation

## 2020-06-25 DIAGNOSIS — K219 Gastro-esophageal reflux disease without esophagitis: Secondary | ICD-10-CM | POA: Insufficient documentation

## 2020-07-04 NOTE — Progress Notes (Signed)
Cardiology Office Note:    Date:  07/05/2020   ID:  Lindsey Frost, DOB 04-23-1941, MRN [id number]  PCP:  Lowella Dandy, NP  Cardiologist:  Shirlee More, MD    Referring MD: Lowella Dandy, NP    ASSESSMENT:    1. Coronary artery disease involving native coronary artery of native heart with angina pectoris ([place])   2. Hypertensive heart disease with heart failure ([place])   3. Mixed hyperlipidemia   4. Bradycardia    PLAN:    In order of problems listed above:  1. Stable having no angina on current medical therapy reduce aspirin to 81 mg daily continue lipid-lowering treatment and is not on a beta-blocker because of bradycardia and continue oral nitrate. 2. Stable BP at target continue treatment including ARB nonrate limiting calcium channel blocker and loop diuretic 3. Lipids are ideal continue her low intensity statin 4. Stable avoid beta blockers 5. Stable diabetes home measurements are improved   Next appointment: 6 months   Medication Adjustments/Labs and Tests Ordered: Current medicines are reviewed at length with the patient today.  Concerns regarding medicines are outlined above.  Orders Placed This Encounter  Procedures  . EKG 12-Lead   No orders of the defined types were placed in this encounter.   Chief Complaint  Patient presents with  . Follow-up  . Coronary Artery Disease  . Congestive Heart Failure    History of Present Illness:    Lindsey Frost is a 79 y.o. female with a hx of sinus bradycardia CAD with PCI and Taxus stent to LAD 2006 dyslipidemia hypertensive heart disease with chronic diastolic heart failure and iron deficiency anemia.  He wants last seen 12/11/2019.  She follows endocrinology latest hemoglobin A1c 0 716 8.9% and cholesterol 121 LDL 62 triglycerides 120 HDL 35 Compliance with diet, lifestyle and medications: Yes  Her diabetes was transiently under control her numbers now are generally less than 150 mg percent.  Taking her  diuretic every other day she notices no edema shortness of breath chest pain palpitation or syncope.  Is a list of blood pressures generally in range less than 1 15-4 40 systolic Past Medical History:  Diagnosis Date  . Acute kidney injury ([place]) 09/04/2015  . Altered awareness, transient 02/08/2012  . Anemia 12/08/2016  . Arthritis    OA AND PAIN LEFT KNEE  AND ARTHRITIS IN FINGERS  . Atherosclerotic heart disease of native coronary artery without angina pectoris 02/08/2012   Overview:  Overview:  PCI and Taxus stent to LAD 2006  Formatting of this note might be different from the original. PCI and Taxus stent to LAD 2006  . Bradycardia with 41-50 beats per minute 08/22/2018  . Cancer (HCC)    UTERINE CANCER - S/P HYSTERECTOMY  . CHF exacerbation ([place]) 02/08/2012  . Chronic diastolic CHF (congestive heart failure) ([place]) 09/04/2015  . Chronic diastolic heart failure ([place]) 02/08/2012  . Coronary artery disease    Followed by Dr.Jadene Stemmer  . Coronary atherosclerosis of native coronary artery 02/08/2012  . Diabetes mellitus   . Essential hypertension, benign 02/08/2012  . ETD (Eustachian tube dysfunction), right 01/01/2018  . Frequent PVCs 12/08/2016  . GERD (gastroesophageal reflux disease)   . Hypercalcemia 09/04/2015  . Hyperlipidemia   . Hypertension   . Hypertensive heart disease with heart failure ([place]) 12/19/2015  . Left hip pain 09/04/2015  . Leukocytosis 09/04/2015  . LVH (left ventricular hypertrophy) due to hypertensive disease, with heart failure ([place]) 12/08/2016  .  Mixed hyperlipidemia 02/08/2012  . Myocardial infarction ([place]) JULY 2006   [place]  . Occult blood positive stool 12/08/2016  . OSA (obstructive sleep apnea) 12/08/2016  . Peripheral edema 12/08/2016  . S/P left TKA 02/06/2012  . SIRS (systemic inflammatory response syndrome) ([place]) 09/04/2015  . Sleep apnea    PT HAS CPAP MASK AND MACHINE AT HOME--BUT DOES NOT USE-COULD NOT TOLERATE  . Stage 3a chronic kidney  disease ([place]) 07/21/2019  . Type 2 diabetes mellitus with circulatory disorder ([place]) 09/04/2015    Past Surgical History:  Procedure Laterality Date  . ABDOMINAL HYSTERECTOMY  2000   UTERINE CANCER  . BILATERAL CATARACT EXTRACTIONS  2005  . CORONARY ANGIOPLASTY    . LEFT KNEE ARTHROSCOPY  2012  . TOTAL KNEE ARTHROPLASTY  02/05/2012   Procedure: TOTAL KNEE ARTHROPLASTY;  Surgeon: Mauri Pole, MD;  Location: [facility];  Service: Orthopedics;  Laterality: Left;    Current Medications: Current Meds  Medication Sig  . acetaminophen (TYLENOL) 500 MG tablet Take 1,000 mg by mouth every 4 (four) hours as needed.   Marland Kitchen amLODipine (NORVASC) 10 MG tablet Take 10 mg by mouth daily. For blood pressure  . aspirin EC 81 MG tablet Take 81 mg by mouth in the morning and at bedtime.  . Calcium-Vitamin D (CALTRATE 600 PLUS-VIT D PO) Take 1 tablet by mouth every morning.   . canagliflozin (INVOKANA) 100 MG TABS tablet Take 100 mg by mouth daily with breakfast.   . Candesartan Cilexetil-HCTZ 32-25 MG TABS Take 1 tablet once a day for blood pressure and heart  . furosemide (LASIX) 20 MG tablet Take 20 mg by mouth every other day.  Marland Kitchen glimepiride (AMARYL) 4 MG tablet Take 4 mg by mouth daily with breakfast.   . isosorbide mononitrate (IMDUR) 60 MG 24 hr tablet TAKE 1 TABLET(60 MG) BY MOUTH DAILY  . Multiple Vitamin (MULTIVITAMIN WITH MINERALS) TABS tablet Take 1 tablet by mouth daily.  Marland Kitchen MYRBETRIQ 25 MG TB24 tablet Take 25 mg by mouth daily.  . nitroGLYCERIN (NITROSTAT) 0.4 MG SL tablet Place 1 tablet (0.4 mg total) under the tongue every 5 (five) minutes as needed for chest pain.  [name] DELICA LANCETS FINE MISC   . pantoprazole (PROTONIX) 40 MG tablet Take 40 mg by mouth daily.  . pravastatin (PRAVACHOL) 40 MG tablet TAKE 1 TABLET BY MOUTH EVERY DAY  . risedronate (ACTONEL) 150 MG tablet Take 150 mg by mouth every 30 (thirty) days.  . sitaGLIPtin-metformin (JANUMET) 50-1000 MG tablet Take 1 tablet by  mouth daily. (Patient taking differently: Take 1 tablet by mouth. 0.5 TABLET TWICE A DAY)  . triamcinolone cream (KENALOG) 0.1 % apply to affected area ON FACE twice a day for 5 to 7 days  . UNABLE TO FIND Take 1 tablet by mouth daily. HEMATINIC PLUS  . Vitamins/Minerals TABS Take 1 tablet by mouth daily.  Marland Kitchen zinc gluconate 50 MG tablet Take 50 mg by mouth daily.     Allergies:   Prednisone and Hydralazine hcl   Social History   Socioeconomic History  . Marital status: Married    Spouse name: Not on file  . Number of children: Not on file  . Years of education: Not on file  . Highest education level: Not on file  Occupational History  . Not on file  Tobacco Use  . Smoking status: Never Smoker  . Smokeless tobacco: Never Used  Vaping Use  . Vaping Use: Never  used  Substance and Sexual Activity  . Alcohol use: No  . Drug use: No  . Sexual activity: Not Currently  Other Topics Concern  . Not on file  Social History Narrative  . Not on file   Social Determinants of Health   Financial Resource Strain:   . Difficulty of Paying Living Expenses: Not on file  Food Insecurity:   . Worried About Charity fundraiser in the Last Year: Not on file  . Ran Out of Food in the Last Year: Not on file  Transportation Needs:   . Lack of Transportation (Medical): Not on file  . Lack of Transportation (Non-Medical): Not on file  Physical Activity:   . Days of Exercise per Week: Not on file  . Minutes of Exercise per Session: Not on file  Stress:   . Feeling of Stress : Not on file  Social Connections:   . Frequency of Communication with Friends and Family: Not on file  . Frequency of Social Gatherings with Friends and Family: Not on file  . Attends Religious Services: Not on file  . Active Member of Clubs or Organizations: Not on file  . Attends Archivist Meetings: Not on file  . Marital Status: Not on file     Family History: The patient's family history includes Atrial  fibrillation in an other family member; Hypertension in an other family member. ROS:   Please see the history of present illness.    All other systems reviewed and are negative.  EKGs/Labs/Other Studies Reviewed:    The following studies were reviewed today:  EKG:  EKG ordered today and personally reviewed.  The ekg ordered today demonstrates is rhythm old anterior MI    Physical Exam:    VS:  BP 120/60   Pulse 77   Ht 5' (1.524 m)   Wt 160 lb 9.6 oz (72.8 kg)   SpO2 96%   BMI 31.37 kg/m     Wt Readings from Last 3 Encounters:  07/05/20 160 lb 9.6 oz (72.8 kg)  12/11/19 163 lb 3.2 oz (74 kg)  05/05/19 171 lb (77.6 kg)     GEN:  Well nourished, well developed in no acute distress HEENT: Normal NECK: No JVD; No carotid bruits LYMPHATICS: No lymphadenopathy CARDIAC: RRR, no murmurs, rubs, gallops RESPIRATORY:  Clear to auscultation without rales, wheezing or rhonchi  ABDOMEN: Soft, non-tender, non-distended MUSCULOSKELETAL:  No edema; No deformity  SKIN: Warm and dry NEUROLOGIC:  Alert and oriented x 3 PSYCHIATRIC:  Normal affect    Signed, Shirlee More, MD  07/05/2020 2:49 PM    [place]

## 2020-07-05 ENCOUNTER — Ambulatory Visit: Payer: PPO | Admitting: Cardiology

## 2020-07-05 ENCOUNTER — Other Ambulatory Visit: Payer: Self-pay

## 2020-07-05 ENCOUNTER — Encounter: Payer: Self-pay | Admitting: Cardiology

## 2020-07-05 VITALS — BP 120/60 | HR 77 | Ht 60.0 in | Wt 160.6 lb

## 2020-07-05 DIAGNOSIS — I110 Hypertensive heart disease with heart failure: Secondary | ICD-10-CM | POA: Diagnosis not present

## 2020-07-05 DIAGNOSIS — E782 Mixed hyperlipidemia: Secondary | ICD-10-CM

## 2020-07-05 DIAGNOSIS — R001 Bradycardia, unspecified: Secondary | ICD-10-CM | POA: Diagnosis not present

## 2020-07-05 DIAGNOSIS — I25119 Atherosclerotic heart disease of native coronary artery with unspecified angina pectoris: Secondary | ICD-10-CM | POA: Diagnosis not present

## 2020-07-05 NOTE — Patient Instructions (Signed)
Medication Instructions:  Your physician has recommended you make the following change in your medication:  DECREASE: Aspirin 81 mg take one tablet by mouth daily.  *If you need a refill on your cardiac medications before your next appointment, please call your pharmacy*   Lab Work: None If you have labs (blood work) drawn today and your tests are completely normal, you will receive your results only by: Marland Kitchen MyChart Message (if you have MyChart) OR . A paper copy in the mail If you have any lab test that is abnormal or we need to change your treatment, we will call you to review the results.   Testing/Procedures: None   Follow-Up: At [facility], you and your health needs are our priority.  As part of our continuing mission to provide you with exceptional heart care, we have created designated Provider Care Teams.  These Care Teams include your primary Cardiologist (physician) and Advanced Practice Providers (APPs -  Physician Assistants and Nurse Practitioners) who all work together to provide you with the care you need, when you need it.  We recommend signing up for the patient portal called "MyChart".  Sign up information is provided on this After Visit Summary.  MyChart is used to connect with patients for Virtual Visits (Telemedicine).  Patients are able to view lab/test results, encounter notes, upcoming appointments, etc.  Non-urgent messages can be sent to your provider as well.   To learn more about what you can do with MyChart, go to [email or web address].    Your next appointment:   6 month(s)  The format for your next appointment:   In Person  Provider:   Shirlee More, MD   Other Instructions

## 2020-07-12 DIAGNOSIS — N3281 Overactive bladder: Secondary | ICD-10-CM | POA: Diagnosis not present

## 2020-07-12 DIAGNOSIS — I509 Heart failure, unspecified: Secondary | ICD-10-CM | POA: Diagnosis not present

## 2020-07-12 DIAGNOSIS — E1159 Type 2 diabetes mellitus with other circulatory complications: Secondary | ICD-10-CM | POA: Diagnosis not present

## 2020-07-12 DIAGNOSIS — E785 Hyperlipidemia, unspecified: Secondary | ICD-10-CM | POA: Diagnosis not present

## 2020-07-12 DIAGNOSIS — I10 Essential (primary) hypertension: Secondary | ICD-10-CM | POA: Diagnosis not present

## 2020-07-12 DIAGNOSIS — K219 Gastro-esophageal reflux disease without esophagitis: Secondary | ICD-10-CM | POA: Diagnosis not present

## 2020-07-12 DIAGNOSIS — D509 Iron deficiency anemia, unspecified: Secondary | ICD-10-CM | POA: Diagnosis not present

## 2020-07-12 DIAGNOSIS — M79671 Pain in right foot: Secondary | ICD-10-CM | POA: Diagnosis not present

## 2020-07-12 DIAGNOSIS — Z6831 Body mass index (BMI) 31.0-31.9, adult: Secondary | ICD-10-CM | POA: Diagnosis not present

## 2020-07-20 DIAGNOSIS — N1831 Chronic kidney disease, stage 3a: Secondary | ICD-10-CM | POA: Diagnosis not present

## 2020-07-20 DIAGNOSIS — E782 Mixed hyperlipidemia: Secondary | ICD-10-CM | POA: Diagnosis not present

## 2020-07-20 DIAGNOSIS — I10 Essential (primary) hypertension: Secondary | ICD-10-CM | POA: Diagnosis not present

## 2020-07-20 DIAGNOSIS — E1165 Type 2 diabetes mellitus with hyperglycemia: Secondary | ICD-10-CM | POA: Diagnosis not present

## 2020-07-23 DIAGNOSIS — G4730 Sleep apnea, unspecified: Secondary | ICD-10-CM | POA: Diagnosis not present

## 2020-07-23 DIAGNOSIS — I2510 Atherosclerotic heart disease of native coronary artery without angina pectoris: Secondary | ICD-10-CM | POA: Diagnosis not present

## 2020-07-23 DIAGNOSIS — E1122 Type 2 diabetes mellitus with diabetic chronic kidney disease: Secondary | ICD-10-CM | POA: Diagnosis not present

## 2020-07-23 DIAGNOSIS — I5032 Chronic diastolic (congestive) heart failure: Secondary | ICD-10-CM | POA: Diagnosis not present

## 2020-07-23 DIAGNOSIS — I130 Hypertensive heart and chronic kidney disease with heart failure and stage 1 through stage 4 chronic kidney disease, or unspecified chronic kidney disease: Secondary | ICD-10-CM | POA: Diagnosis not present

## 2020-07-23 DIAGNOSIS — E1165 Type 2 diabetes mellitus with hyperglycemia: Secondary | ICD-10-CM | POA: Diagnosis not present

## 2020-07-23 DIAGNOSIS — N1831 Chronic kidney disease, stage 3a: Secondary | ICD-10-CM | POA: Diagnosis not present

## 2020-07-23 DIAGNOSIS — E782 Mixed hyperlipidemia: Secondary | ICD-10-CM | POA: Diagnosis not present

## 2020-08-16 DIAGNOSIS — H9311 Tinnitus, right ear: Secondary | ICD-10-CM | POA: Diagnosis not present

## 2020-08-16 DIAGNOSIS — H6981 Other specified disorders of Eustachian tube, right ear: Secondary | ICD-10-CM | POA: Diagnosis not present

## 2020-08-16 HISTORY — DX: Tinnitus, right ear: H93.11

## 2020-08-30 DIAGNOSIS — E119 Type 2 diabetes mellitus without complications: Secondary | ICD-10-CM | POA: Diagnosis not present

## 2020-08-30 DIAGNOSIS — H524 Presbyopia: Secondary | ICD-10-CM | POA: Diagnosis not present

## 2020-08-30 DIAGNOSIS — Z961 Presence of intraocular lens: Secondary | ICD-10-CM | POA: Diagnosis not present

## 2020-08-30 DIAGNOSIS — H52203 Unspecified astigmatism, bilateral: Secondary | ICD-10-CM | POA: Diagnosis not present

## 2020-08-30 DIAGNOSIS — H5213 Myopia, bilateral: Secondary | ICD-10-CM | POA: Diagnosis not present

## 2020-08-30 DIAGNOSIS — Z7984 Long term (current) use of oral hypoglycemic drugs: Secondary | ICD-10-CM | POA: Diagnosis not present

## 2020-08-31 DIAGNOSIS — B9689 Other specified bacterial agents as the cause of diseases classified elsewhere: Secondary | ICD-10-CM | POA: Diagnosis not present

## 2020-08-31 DIAGNOSIS — J019 Acute sinusitis, unspecified: Secondary | ICD-10-CM | POA: Diagnosis not present

## 2020-09-01 ENCOUNTER — Other Ambulatory Visit: Payer: Self-pay | Admitting: Cardiology

## 2020-09-01 NOTE — Telephone Encounter (Signed)
Rx refill sent to pharmacy. 

## 2020-09-24 ENCOUNTER — Other Ambulatory Visit: Payer: Self-pay | Admitting: Cardiology

## 2020-09-24 DIAGNOSIS — J019 Acute sinusitis, unspecified: Secondary | ICD-10-CM | POA: Diagnosis not present

## 2020-09-24 DIAGNOSIS — B9689 Other specified bacterial agents as the cause of diseases classified elsewhere: Secondary | ICD-10-CM | POA: Diagnosis not present

## 2020-09-24 DIAGNOSIS — I10 Essential (primary) hypertension: Secondary | ICD-10-CM | POA: Diagnosis not present

## 2020-09-24 DIAGNOSIS — I499 Cardiac arrhythmia, unspecified: Secondary | ICD-10-CM | POA: Diagnosis not present

## 2020-10-11 DIAGNOSIS — N760 Acute vaginitis: Secondary | ICD-10-CM | POA: Diagnosis not present

## 2020-10-11 DIAGNOSIS — I499 Cardiac arrhythmia, unspecified: Secondary | ICD-10-CM | POA: Diagnosis not present

## 2020-10-18 DIAGNOSIS — R001 Bradycardia, unspecified: Secondary | ICD-10-CM | POA: Diagnosis not present

## 2020-10-21 ENCOUNTER — Telehealth: Payer: Self-pay | Admitting: Cardiology

## 2020-10-21 NOTE — Telephone Encounter (Signed)
Patient scheduled tomorrow morning with Dr. Bettina Gavia.

## 2020-10-21 NOTE — Telephone Encounter (Signed)
Lindsey Frost from the patient's PCP called to request the patient be worked in for a sooner appointment. She states they sent an urgent referral and scheduled her for 11/05/2020 in [facility]. She would like someone to call the patient back if she can be worked in at the [employer].

## 2020-10-22 ENCOUNTER — Other Ambulatory Visit: Payer: Self-pay

## 2020-10-22 ENCOUNTER — Encounter: Payer: Self-pay | Admitting: Cardiology

## 2020-10-22 ENCOUNTER — Ambulatory Visit: Payer: HMO | Admitting: Cardiology

## 2020-10-22 ENCOUNTER — Ambulatory Visit (INDEPENDENT_AMBULATORY_CARE_PROVIDER_SITE_OTHER): Payer: HMO

## 2020-10-22 VITALS — BP 136/68 | HR 49 | Ht 60.0 in | Wt 159.0 lb

## 2020-10-22 DIAGNOSIS — I493 Ventricular premature depolarization: Secondary | ICD-10-CM

## 2020-10-22 DIAGNOSIS — E782 Mixed hyperlipidemia: Secondary | ICD-10-CM

## 2020-10-22 DIAGNOSIS — R001 Bradycardia, unspecified: Secondary | ICD-10-CM

## 2020-10-22 DIAGNOSIS — I25119 Atherosclerotic heart disease of native coronary artery with unspecified angina pectoris: Secondary | ICD-10-CM | POA: Diagnosis not present

## 2020-10-22 DIAGNOSIS — I110 Hypertensive heart disease with heart failure: Secondary | ICD-10-CM

## 2020-10-22 NOTE — Patient Instructions (Signed)
Medication Instructions:  Your physician recommends that you continue on your current medications as directed. Please refer to the Current Medication list given to you today.  *If you need a refill on your cardiac medications before your next appointment, please call your pharmacy*   Lab Work: None If you have labs (blood work) drawn today and your tests are completely normal, you will receive your results only by: . MyChart Message (if you have MyChart) OR . A paper copy in the mail If you have any lab test that is abnormal or we need to change your treatment, we will call you to review the results.   Testing/Procedures: A zio monitor was ordered today. It will remain on for 14 days. You will then return monitor and event diary in provided box. It takes 1-2 weeks for report to be downloaded and returned to us. We will call you with the results. If monitor falls off or has orange flashing light, please call Zio for further instructions.      Follow-Up: At [facility], you and your health needs are our priority.  As part of our continuing mission to provide you with exceptional heart care, we have created designated Provider Care Teams.  These Care Teams include your primary Cardiologist (physician) and Advanced Practice Providers (APPs -  Physician Assistants and Nurse Practitioners) who all work together to provide you with the care you need, when you need it.  We recommend signing up for the patient portal called "MyChart".  Sign up information is provided on this After Visit Summary.  MyChart is used to connect with patients for Virtual Visits (Telemedicine).  Patients are able to view lab/test results, encounter notes, upcoming appointments, etc.  Non-urgent messages can be sent to your provider as well.   To learn more about what you can do with MyChart, go to [email or web address].    Your next appointment:   8 week(s)  The format for your next appointment:   In  Person  Provider:   Brian Munley, MD   Other Instructions   

## 2020-10-22 NOTE — Progress Notes (Signed)
Cardiology Office Note:    Date:  10/22/2020   ID:  Lindsey Louise Jubb, [place] 1941-06-15, MRN [id number]  PCP:  Lowella Dandy, NP  Cardiologist:  Shirlee More, MD    Referring MD: Lowella Dandy, NP    ASSESSMENT:    1. Frequent PVCs   2. Bradycardia with 41-50 beats per minute   3. Coronary artery disease involving native coronary artery of native heart with angina pectoris ([place])   4. Hypertensive heart disease with heart failure ([place])   5. Mixed hyperlipidemia    PLAN:    In order of problems listed above:  1. Her concern is bradycardia and I think really what the issue is is ventricular bigeminy and half counting on her pulse meter device.  For further evaluation of both ventricular arrhythmia and bradycardia we will apply a live ZIO monitor for 14 days.  She is reassured. 2. Stable CAD no anginal discomfort.  She is on no rate slowing medications continue aspirin calcium channel blocker and her statin. 3. Stable no fluid overload continue her current diuretic and antihypertensives 4. Continue statin with CAD   Next appointment: 6 to 8 weeks   Medication Adjustments/Labs and Tests Ordered: Current medicines are reviewed at length with the patient today.  Concerns regarding medicines are outlined above.  Orders Placed This Encounter  Procedures  . LONG TERM MONITOR-LIVE TELEMETRY (3-14 DAYS)  . EKG 12-Lead   No orders of the defined types were placed in this encounter.   Chief Complaint  Patient presents with  . Bradycardia    History of Present Illness:    Lindsey Frost is a 80 y.o. female with a hx of sinus bradycardia CAD with PCI and Taxus stent to LAD 2006 dyslipidemia hypertensive heart disease with chronic diastolic heart failure and iron deficiency anemia  last seen 07/05/2020.  She was seen by her PCP 10/19/2020 with complaints of bradycardia heart rate was recorded in the office at 77 bpm laboratory studies included a BMP with a sodium 140 potassium 4.9  creatinine 1.48 GFR 35 cc/min.  An EKG performed which showed sinus rhythm with ventricular bigeminy.  EKG the previous  month prior showed no PVCs.  Compliance with diet, lifestyle and medications: Yes  For the last month she has had heart rates in the 40-50 beat range and she is aware of her heart beating forcefully particularly at nighttime feeling a rush to her head she has not had syncope or near syncope.  Otherwise well no edema chest pain shortness of breath.  She relates that she has had routine labs including a CBC with her PCP.  I noticed the labs that I can review from [facility]. Past Medical History:  Diagnosis Date  . Acute kidney injury ([place]) 09/04/2015  . Altered awareness, transient 02/08/2012  . Anemia 12/08/2016  . Arthritis    OA AND PAIN LEFT KNEE  AND ARTHRITIS IN FINGERS  . Atherosclerotic heart disease of native coronary artery without angina pectoris 02/08/2012   Overview:  Overview:  PCI and Taxus stent to LAD 2006  Formatting of this note might be different from the original. PCI and Taxus stent to LAD 2006  . Bradycardia with 41-50 beats per minute 08/22/2018  . Cancer (HCC)    UTERINE CANCER - S/P HYSTERECTOMY  . CHF exacerbation ([place]) 02/08/2012  . Chronic diastolic CHF (congestive heart failure) ([place]) 09/04/2015  . Chronic diastolic heart failure ([place]) 02/08/2012  . Coronary artery disease  Followed by Dr.Munley  . Coronary atherosclerosis of native coronary artery 02/08/2012  . Diabetes mellitus   . Essential hypertension, benign 02/08/2012  . ETD (Eustachian tube dysfunction), right 01/01/2018  . Frequent PVCs 12/08/2016  . GERD (gastroesophageal reflux disease)   . Hypercalcemia 09/04/2015  . Hyperlipidemia   . Hypertension   . Hypertensive heart disease with heart failure ([place]) 12/19/2015  . Left hip pain 09/04/2015  . Leukocytosis 09/04/2015  . LVH (left ventricular hypertrophy) due to hypertensive disease, with heart failure ([place]) 12/08/2016  . Mixed hyperlipidemia 02/08/2012   . Myocardial infarction ([place]) JULY 2006   [place]  . Occult blood positive stool 12/08/2016  . OSA (obstructive sleep apnea) 12/08/2016  . Peripheral edema 12/08/2016  . S/P left TKA 02/06/2012  . SIRS (systemic inflammatory response syndrome) ([place]) 09/04/2015  . Sleep apnea    PT HAS CPAP MASK AND MACHINE AT HOME--BUT DOES NOT USE-COULD NOT TOLERATE  . Stage 3a chronic kidney disease ([place]) 07/21/2019  . Type 2 diabetes mellitus with circulatory disorder ([place]) 09/04/2015    Past Surgical History:  Procedure Laterality Date  . ABDOMINAL HYSTERECTOMY  2000   UTERINE CANCER  . BILATERAL CATARACT EXTRACTIONS  2005  . CORONARY ANGIOPLASTY    . LEFT KNEE ARTHROSCOPY  2012  . TOTAL KNEE ARTHROPLASTY  02/05/2012   Procedure: TOTAL KNEE ARTHROPLASTY;  Surgeon: Mauri Pole, MD;  Location: [facility];  Service: Orthopedics;  Laterality: Left;    Current Medications: Current Meds  Medication Sig  . amLODipine (NORVASC) 10 MG tablet Take 10 mg by mouth daily. For blood pressure  . aspirin EC 81 MG tablet Take 81 mg by mouth in the morning and at bedtime.  . Calcium-Vitamin D (CALTRATE 600 PLUS-VIT D PO) Take 1 tablet by mouth every morning.   . canagliflozin (INVOKANA) 100 MG TABS tablet Take 100 mg by mouth daily with breakfast.   . Candesartan Cilexetil-HCTZ 32-25 MG TABS Take 1 tablet once a day for blood pressure and heart  . Fe Fum-FA-B Cmp-C-Zn-Mg-Mn-Cu (HEMATINIC PLUS COMPLEX PO) Take 1 tablet by mouth daily.  . furosemide (LASIX) 20 MG tablet TAKE 1 TABLET BY MOUTH EVERY OTHER DAY. TAKE ADDITIONAL DOSE ON DAYS WEIGHT IS GREATER THAN 171 POUNDS  . glimepiride (AMARYL) 4 MG tablet Take 4 mg by mouth daily with breakfast.   . isosorbide mononitrate (IMDUR) 60 MG 24 hr tablet TAKE 1 TABLET(60 MG) BY MOUTH DAILY  . Multiple Vitamin (MULTIVITAMIN WITH MINERALS) TABS tablet Take 1 tablet by mouth daily.  Marland Kitchen MYRBETRIQ 25 MG TB24 tablet Take 25 mg by mouth daily.  .  nitroGLYCERIN (NITROSTAT) 0.4 MG SL tablet Place 1 tablet (0.4 mg total) under the tongue every 5 (five) minutes as needed for chest pain.  . pantoprazole (PROTONIX) 40 MG tablet Take 40 mg by mouth daily.  . pravastatin (PRAVACHOL) 40 MG tablet TAKE 1 TABLET BY MOUTH EVERY DAY  . risedronate (ACTONEL) 150 MG tablet Take 150 mg by mouth every 30 (thirty) days.  . sitaGLIPtin-metformin (JANUMET) 50-1000 MG tablet Take 1 tablet by mouth daily. (Patient taking differently: Take 1 tablet by mouth. 0.5 TABLET TWICE A DAY)  . Vitamins/Minerals TABS Take 1 tablet by mouth daily.  Marland Kitchen zinc gluconate 50 MG tablet Take 50 mg by mouth daily.     Allergies:   Prednisone and Hydralazine hcl   Social History   Socioeconomic History  . Marital status: Married    Spouse name: Not  on file  . Number of children: Not on file  . Years of education: Not on file  . Highest education level: Not on file  Occupational History  . Not on file  Tobacco Use  . Smoking status: Never Smoker  . Smokeless tobacco: Never Used  Vaping Use  . Vaping Use: Never used  Substance and Sexual Activity  . Alcohol use: No  . Drug use: No  . Sexual activity: Not Currently  Other Topics Concern  . Not on file  Social History Narrative  . Not on file   Social Determinants of Health   Financial Resource Strain: Not on file  Food Insecurity: Not on file  Transportation Needs: Not on file  Physical Activity: Not on file  Stress: Not on file  Social Connections: Not on file     Family History: The patient's family history includes Atrial fibrillation in an other family member; Hypertension in an other family member. ROS:   Please see the history of present illness.    All other systems reviewed and are negative.  EKGs/Labs/Other Studies Reviewed:    The following studies were reviewed today:  EKG:  EKG ordered today and personally reviewed.  The ekg ordered today demonstrates sinus rhythm normal QRS morphology QT  interval conduction intervals and she has ventricular bigeminy with a rate of 90 bpm  Recent Labs: 10/18/2020 TSH normal 1.01 creatinine 1.48 BUN 31.  Physical Exam:    VS:  BP 136/68 (BP Location: Right Arm, Patient Position: Sitting)   Pulse (!) 49   Ht 5' (1.524 m)   Wt 159 lb (72.1 kg)   SpO2 94%   BMI 31.05 kg/m     Wt Readings from Last 3 Encounters:  10/22/20 159 lb (72.1 kg)  07/05/20 160 lb 9.6 oz (72.8 kg)  12/11/19 163 lb 3.2 oz (74 kg)     GEN: Anxious well nourished, well developed in no acute distress HEENT: Normal NECK: No JVD; No carotid bruits LYMPHATICS: No lymphadenopathy CARDIAC: Frequent extrasystoles RRR, no murmurs, rubs, gallops RESPIRATORY:  Clear to auscultation without rales, wheezing or rhonchi  ABDOMEN: Soft, non-tender, non-distended MUSCULOSKELETAL:  No edema; No deformity  SKIN: Warm and dry NEUROLOGIC:  Alert and oriented x 3 PSYCHIATRIC:  Normal affect    Signed, Shirlee More, MD  10/22/2020 10:52 AM    [place]

## 2020-10-23 DIAGNOSIS — R001 Bradycardia, unspecified: Secondary | ICD-10-CM | POA: Diagnosis not present

## 2020-10-23 DIAGNOSIS — I493 Ventricular premature depolarization: Secondary | ICD-10-CM | POA: Diagnosis not present

## 2020-10-27 ENCOUNTER — Encounter: Payer: Self-pay | Admitting: *Deleted

## 2020-10-27 ENCOUNTER — Encounter: Payer: Self-pay | Admitting: Cardiology

## 2020-11-05 ENCOUNTER — Ambulatory Visit: Payer: PPO | Admitting: Cardiology

## 2020-11-05 DIAGNOSIS — R001 Bradycardia, unspecified: Secondary | ICD-10-CM

## 2020-11-05 DIAGNOSIS — I493 Ventricular premature depolarization: Secondary | ICD-10-CM

## 2020-11-08 DIAGNOSIS — Z6831 Body mass index (BMI) 31.0-31.9, adult: Secondary | ICD-10-CM | POA: Diagnosis not present

## 2020-11-08 DIAGNOSIS — N3281 Overactive bladder: Secondary | ICD-10-CM | POA: Diagnosis not present

## 2020-11-08 DIAGNOSIS — E785 Hyperlipidemia, unspecified: Secondary | ICD-10-CM | POA: Diagnosis not present

## 2020-11-08 DIAGNOSIS — R001 Bradycardia, unspecified: Secondary | ICD-10-CM | POA: Diagnosis not present

## 2020-11-08 DIAGNOSIS — D509 Iron deficiency anemia, unspecified: Secondary | ICD-10-CM | POA: Diagnosis not present

## 2020-11-08 DIAGNOSIS — E1159 Type 2 diabetes mellitus with other circulatory complications: Secondary | ICD-10-CM | POA: Diagnosis not present

## 2020-11-08 DIAGNOSIS — K219 Gastro-esophageal reflux disease without esophagitis: Secondary | ICD-10-CM | POA: Diagnosis not present

## 2020-11-08 DIAGNOSIS — I10 Essential (primary) hypertension: Secondary | ICD-10-CM | POA: Diagnosis not present

## 2020-11-08 DIAGNOSIS — I509 Heart failure, unspecified: Secondary | ICD-10-CM | POA: Diagnosis not present

## 2020-11-18 ENCOUNTER — Telehealth: Payer: Self-pay

## 2020-11-18 DIAGNOSIS — I2510 Atherosclerotic heart disease of native coronary artery without angina pectoris: Secondary | ICD-10-CM

## 2020-11-18 NOTE — Telephone Encounter (Signed)
Spoke with patient regarding results and recommendation.  Patient verbalizes understanding and is agreeable to plan of care. Advised patient to call back with any issues or concerns.     [facility] [address] [place], [place] [address] Phone:  [phone]    Please arrive 15 minutes prior to your appointment time for registration and insurance purposes.  The test will take approximately 3 to 4 hours to complete; you may bring reading material.  If someone comes with you to your appointment, they will need to remain in the main lobby due to limited space in the testing area. **If you are pregnant or breastfeeding, please notify the nuclear lab prior to your appointment**  How to prepare for your Myocardial Perfusion Test: . Do not eat or drink 3 hours prior to your test, except you may have water. . Do not consume products containing caffeine (regular or decaffeinated) 12 hours prior to your test. (ex: coffee, chocolate, sodas, tea). . Do bring a list of your current medications with you.  If not listed below, you may take your medications as normal. .  HOLD diabetic medication the morning of the test. , . Do wear comfortable clothes (no dresses or overalls) and walking shoes, tennis shoes preferred (No heels or open toe shoes are allowed). . Do NOT wear cologne, perfume, aftershave, or lotions (deodorant is allowed). . If these instructions are not followed, your test will have to be rescheduled.  Please report to [address] for your test.  If you have questions or concerns about your appointment, you can call the [facility] at [phone].  If you cannot keep your appointment, please provide 24 hours notification to the Nuclear Lab, to avoid a possible $50 charge to your account.

## 2020-11-18 NOTE — Telephone Encounter (Signed)
-----   Message from Richardo Priest, MD sent at 11/18/2020 11:29 AM EDT ----- As we suspected problem is not slow heart rate spirits frequent PVCs episodes of bigeminy and causing half counting with a pulse meter  Frequency of extra beats is very high and sometimes the people could have blocked arteries it tells [place] the of underlying problems with blood flow to the heart muscle and like her to have a Lexiscan Myoview done in the office and then to see me in the office afterwards with back after my vacation.

## 2020-11-18 NOTE — Telephone Encounter (Signed)
Left message on patients voicemail to please return our call.   

## 2020-11-20 ENCOUNTER — Other Ambulatory Visit: Payer: Self-pay | Admitting: Cardiology

## 2020-11-22 NOTE — Telephone Encounter (Signed)
Janumet 50-1000 refill # 90 tablets only with message to pharmacy to contact patients pcp for further refills

## 2020-11-24 ENCOUNTER — Telehealth (HOSPITAL_COMMUNITY): Payer: Self-pay | Admitting: *Deleted

## 2020-11-24 NOTE — Addendum Note (Signed)
Addended byShirlee More on: 11/24/2020 09:15 AM   Modules accepted: Orders

## 2020-11-24 NOTE — Telephone Encounter (Signed)
Left message on voicemail per DPR in reference to upcoming appointment scheduled on 11/30/20 with detailed instructions given per Myocardial Perfusion Study Information Sheet for the test. LM to arrive 15 minutes early, and that it is imperative to arrive on time for appointment to keep from having the test rescheduled. If you need to cancel or reschedule your appointment, please call the office within 24 hours of your appointment. Failure to do so may result in a cancellation of your appointment, and a $50 no show fee. Phone number given for call back for any questions. Kirstie Peri

## 2020-11-26 DIAGNOSIS — E1165 Type 2 diabetes mellitus with hyperglycemia: Secondary | ICD-10-CM | POA: Diagnosis not present

## 2020-11-26 DIAGNOSIS — N1831 Chronic kidney disease, stage 3a: Secondary | ICD-10-CM | POA: Diagnosis not present

## 2020-11-26 DIAGNOSIS — G4730 Sleep apnea, unspecified: Secondary | ICD-10-CM | POA: Diagnosis not present

## 2020-11-26 DIAGNOSIS — I2510 Atherosclerotic heart disease of native coronary artery without angina pectoris: Secondary | ICD-10-CM | POA: Diagnosis not present

## 2020-11-26 DIAGNOSIS — E782 Mixed hyperlipidemia: Secondary | ICD-10-CM | POA: Diagnosis not present

## 2020-11-26 DIAGNOSIS — I5032 Chronic diastolic (congestive) heart failure: Secondary | ICD-10-CM | POA: Diagnosis not present

## 2020-11-26 DIAGNOSIS — E1122 Type 2 diabetes mellitus with diabetic chronic kidney disease: Secondary | ICD-10-CM | POA: Diagnosis not present

## 2020-11-26 DIAGNOSIS — I130 Hypertensive heart and chronic kidney disease with heart failure and stage 1 through stage 4 chronic kidney disease, or unspecified chronic kidney disease: Secondary | ICD-10-CM | POA: Diagnosis not present

## 2020-11-26 DIAGNOSIS — Z7984 Long term (current) use of oral hypoglycemic drugs: Secondary | ICD-10-CM | POA: Diagnosis not present

## 2020-12-02 DIAGNOSIS — Z139 Encounter for screening, unspecified: Secondary | ICD-10-CM | POA: Diagnosis not present

## 2020-12-02 DIAGNOSIS — Z9181 History of falling: Secondary | ICD-10-CM | POA: Diagnosis not present

## 2020-12-02 DIAGNOSIS — E669 Obesity, unspecified: Secondary | ICD-10-CM | POA: Diagnosis not present

## 2020-12-02 DIAGNOSIS — Z1331 Encounter for screening for depression: Secondary | ICD-10-CM | POA: Diagnosis not present

## 2020-12-02 DIAGNOSIS — Z0000 Encounter for general adult medical examination without abnormal findings: Secondary | ICD-10-CM | POA: Diagnosis not present

## 2020-12-02 DIAGNOSIS — E785 Hyperlipidemia, unspecified: Secondary | ICD-10-CM | POA: Diagnosis not present

## 2020-12-09 ENCOUNTER — Encounter (HOSPITAL_COMMUNITY): Payer: Self-pay | Admitting: *Deleted

## 2020-12-09 ENCOUNTER — Telehealth (HOSPITAL_COMMUNITY): Payer: Self-pay | Admitting: *Deleted

## 2020-12-09 NOTE — Telephone Encounter (Signed)
Attempted calling patient but not answering machine, no email and no MyChart.  Instruction letter sent via [employer] for stress test.

## 2020-12-15 ENCOUNTER — Telehealth: Payer: Self-pay | Admitting: Cardiology

## 2020-12-15 NOTE — Telephone Encounter (Signed)
Spoke with pt and instructions given to pt as requested. Pt verbalized understanding and had no additional questions.   The test will take approximately 3 to 4 hours to complete; you may bring reading material.  If someone comes with you to your appointment, they will need to remain in the main lobby due to limited space in the testing area. **If you are pregnant or breastfeeding, please notify the nuclear lab prior to your appointment**  How to prepare for your Myocardial Perfusion Test: . Do not eat or drink 3 hours prior to your test, except you may have water. . Do not consume products containing caffeine (regular or decaffeinated) 12 hours prior to your test. (ex: coffee, chocolate, sodas, tea). . Do bring a list of your current medications with you.  If not listed below, you may take your medications as normal. . Do wear comfortable clothes (no dresses or overalls) and walking shoes, tennis shoes preferred (No heels or open toe shoes are allowed). . Do NOT wear cologne, perfume, aftershave, or lotions (deodorant is allowed). . If these instructions are not followed, your test will have to be rescheduled.

## 2020-12-15 NOTE — Telephone Encounter (Signed)
Patient states something was mentioned about her diabetic medication for her stress test tomorrow, but she is not sure what.

## 2020-12-16 ENCOUNTER — Ambulatory Visit (INDEPENDENT_AMBULATORY_CARE_PROVIDER_SITE_OTHER): Payer: HMO

## 2020-12-16 ENCOUNTER — Other Ambulatory Visit: Payer: Self-pay

## 2020-12-16 DIAGNOSIS — I2510 Atherosclerotic heart disease of native coronary artery without angina pectoris: Secondary | ICD-10-CM

## 2020-12-16 MED ORDER — TECHNETIUM TC 99M TETROFOSMIN IV KIT
9.9000 | PACK | Freq: Once | INTRAVENOUS | Status: AC | PRN
Start: 2020-12-16 — End: 2020-12-16
  Administered 2020-12-16: 9.9 via INTRAVENOUS

## 2020-12-16 MED ORDER — TECHNETIUM TC 99M TETROFOSMIN IV KIT
32.3000 | PACK | Freq: Once | INTRAVENOUS | Status: AC | PRN
  Administered 2020-12-16: 32.3 via INTRAVENOUS

## 2020-12-16 MED ORDER — REGADENOSON 0.4 MG/5ML IV SOLN
0.4000 mg | Freq: Once | INTRAVENOUS | Status: AC
  Administered 2020-12-16: 0.4 mg via INTRAVENOUS

## 2020-12-17 LAB — MYOCARDIAL PERFUSION IMAGING
LV dias vol: 55 mL (ref 46–106)
LV sys vol: 17 mL
Peak HR: 95 {beats}/min
Rest HR: 76 {beats}/min
SDS: 2
SRS: 0
SSS: 2
TID: 1.12

## 2020-12-20 DIAGNOSIS — I10 Essential (primary) hypertension: Secondary | ICD-10-CM | POA: Diagnosis not present

## 2020-12-20 DIAGNOSIS — R001 Bradycardia, unspecified: Secondary | ICD-10-CM | POA: Diagnosis not present

## 2020-12-20 DIAGNOSIS — N1831 Chronic kidney disease, stage 3a: Secondary | ICD-10-CM | POA: Diagnosis not present

## 2020-12-27 ENCOUNTER — Encounter: Payer: Self-pay | Admitting: Cardiology

## 2020-12-27 ENCOUNTER — Ambulatory Visit: Payer: HMO | Admitting: Cardiology

## 2020-12-27 ENCOUNTER — Ambulatory Visit: Payer: PPO | Admitting: Cardiology

## 2020-12-27 ENCOUNTER — Other Ambulatory Visit: Payer: Self-pay

## 2020-12-27 VITALS — BP 124/62 | HR 72 | Ht 60.0 in | Wt 162.6 lb

## 2020-12-27 DIAGNOSIS — I110 Hypertensive heart disease with heart failure: Secondary | ICD-10-CM | POA: Diagnosis not present

## 2020-12-27 DIAGNOSIS — E782 Mixed hyperlipidemia: Secondary | ICD-10-CM | POA: Diagnosis not present

## 2020-12-27 DIAGNOSIS — I493 Ventricular premature depolarization: Secondary | ICD-10-CM

## 2020-12-27 DIAGNOSIS — I25119 Atherosclerotic heart disease of native coronary artery with unspecified angina pectoris: Secondary | ICD-10-CM

## 2020-12-27 NOTE — Progress Notes (Signed)
Cardiology Office Note:    Date:  12/27/2020   ID:  Lindsey Frost, [place] 05-Oct-1940, MRN [id number]  PCP:  Lowella Dandy, NP  Cardiologist:  Shirlee More, MD    Referring MD: Lowella Dandy, NP    ASSESSMENT:    1. Frequent PVCs   2. Coronary artery disease involving native coronary artery of native heart with angina pectoris ([place])   3. Hypertensive heart disease with heart failure ([place])   4. Mixed hyperlipidemia    PLAN:    In order of problems listed above:  1. He is asymptomatic with a high frequency PVCs and no evidence of cardiomyopathy.  Options for treatment include close observation repeat echocardiogram in 6 months antiarrhythmic drug therapy with amiodarone with CAD and heart failure with local catheter ablation.  She chooses observation.  She is not on beta-blocker with bradycardia. 2. Stable CAD New York Heart Association class I perfusion study without ischemia continue medical therapy 3. Stable BP at target continue current treatment 4. Continue statin with CAD   Next appointment: 6 months   Medication Adjustments/Labs and Tests Ordered: Current medicines are reviewed at length with the patient today.  Concerns regarding medicines are outlined above.  No orders of the defined types were placed in this encounter.  No orders of the defined types were placed in this encounter.   Chief Complaint  Patient presents with  . Follow-up    She has a high PVC burden on monitoring    History of Present Illness:    Lindsey Kyera Felan is a 80 y.o. female with a hx of CAD with PCI Taxus stent LAD 2006 dyslipidemia hypertensive heart disease with chronic diastolic heart failure iron deficiency anemia frequent PVCs and resting bradycardia last seen 10/22/2020.She was seen by her PCP 10/19/2020 with complaints of bradycardia heart rate was recorded in the office at 77 bpm laboratory studies included a BMP with a sodium 140 potassium 4.9 creatinine 1.48 GFR 35 cc/min.  An  EKG performed which showed sinus rhythm with ventricular bigeminy.  EKG the previous  month prior showed no PVCs   Compliance with diet, lifestyle and medications: Yes  I reviewed the results of the nuclear perfusion study and monitor. Presently having no palpitation. I discussed the side effects of antiarrhythmic therapy with amiodarone. She is pleased with the quality of her life and has no exercise intolerance shortness of breath chest pain. At this time she defers antiarrhythmic drug therapy.  Following the visit she performed a 14-day event monitor reported 11/18/2020 which showed frequent ventricular ectopy with a PVC burden 26.5% with episodes of bigeminy longest 1 hour 32 minutes and no significant bradycardia.  Myocardial perfusion study 12/16/2020 shows EF 70% normal perfusion and function. Past Medical History:  Diagnosis Date  . Acute kidney injury ([place]) 09/04/2015  . Altered awareness, transient 02/08/2012  . Anemia 12/08/2016  . Arthritis    OA AND PAIN LEFT KNEE  AND ARTHRITIS IN FINGERS  . Atherosclerotic heart disease of native coronary artery without angina pectoris 02/08/2012   Overview:  Overview:  PCI and Taxus stent to LAD 2006  Formatting of this note might be different from the original. PCI and Taxus stent to LAD 2006  . Bradycardia with 41-50 beats per minute 08/22/2018  . Cancer (HCC)    UTERINE CANCER - S/P HYSTERECTOMY  . CHF exacerbation ([place]) 02/08/2012  . Chronic diastolic CHF (congestive heart failure) ([place]) 09/04/2015  . Chronic diastolic heart failure ([place]) 02/08/2012  .  Coronary artery disease    Followed by Dr.Eino Whitner  . Coronary atherosclerosis of native coronary artery 02/08/2012  . Diabetes mellitus   . Essential hypertension, benign 02/08/2012  . ETD (Eustachian tube dysfunction), right 01/01/2018  . Frequent PVCs 12/08/2016  . GERD (gastroesophageal reflux disease)   . Hiatal hernia 12/26/2019  . Hypercalcemia 09/04/2015  . Hyperlipidemia   . Hypertension   .  Hypertensive heart disease with heart failure ([place]) 12/19/2015  . Left hip pain 09/04/2015  . Leukocytosis 09/04/2015  . LVH (left ventricular hypertrophy) due to hypertensive disease, with heart failure ([place]) 12/08/2016  . Mixed hyperlipidemia 02/08/2012  . Myocardial infarction ([place]) JULY 2006   [place]  . Occult blood positive stool 12/08/2016  . OSA (obstructive sleep apnea) 12/08/2016  . Peripheral edema 12/08/2016  . S/P left TKA 02/06/2012  . SIRS (systemic inflammatory response syndrome) ([place]) 09/04/2015  . Sleep apnea    PT HAS CPAP MASK AND MACHINE AT HOME--BUT DOES NOT USE-COULD NOT TOLERATE  . Stage 3a chronic kidney disease ([place]) 07/21/2019  . Tinnitus of right ear 08/16/2020  . Type 2 diabetes mellitus with circulatory disorder ([place]) 09/04/2015    Past Surgical History:  Procedure Laterality Date  . ABDOMINAL HYSTERECTOMY  2000   UTERINE CANCER  . BILATERAL CATARACT EXTRACTIONS  2005  . CORONARY ANGIOPLASTY  2012  . LEFT KNEE ARTHROSCOPY  2012  . TONSILLECTOMY  1945  . TOTAL KNEE ARTHROPLASTY  02/05/2012   Procedure: TOTAL KNEE ARTHROPLASTY;  Surgeon: Mauri Pole, MD;  Location: [facility];  Service: Orthopedics;  Laterality: Left;  . TYMPANOSTOMY TUBE PLACEMENT Right 07/22/2018    Current Medications: Current Meds  Medication Sig  . amLODipine (NORVASC) 10 MG tablet Take 10 mg by mouth daily. For blood pressure  . aspirin EC 81 MG tablet Take 81 mg by mouth in the morning and at bedtime.  . Calcium Carb-Cholecalciferol 600-800 MG-UNIT TABS Take 1 tablet by mouth daily.  . canagliflozin (INVOKANA) 100 MG TABS tablet Take 50 mg by mouth daily with breakfast.  . Candesartan Cilexetil-HCTZ 32-25 MG TABS Take 1 tablet once a day for blood pressure and heart  . clotrimazole-betamethasone (LOTRISONE) cream Apply 1 application topically 2 (two) times daily.  . Cyanocobalamin (VITAMIN B-12) 5000 MCG TBDP Take 5,000 mcg by mouth daily.  . Fe Fum-FA-B  Cmp-C-Zn-Mg-Mn-Cu (FERROCITE PLUS) 106-1 MG TABS Take 1 tablet by mouth daily.  . furosemide (LASIX) 20 MG tablet TAKE 1 TABLET BY MOUTH EVERY OTHER DAY. TAKE ADDITIONAL DOSE ON DAYS WEIGHT IS GREATER THAN 171 POUNDS  . glimepiride (AMARYL) 4 MG tablet Take 4 mg by mouth daily with breakfast.   . isosorbide mononitrate (IMDUR) 60 MG 24 hr tablet TAKE 1 TABLET(60 MG) BY MOUTH DAILY  . JANUMET 50-1000 MG tablet TAKE 1 TABLET BY MOUTH DAILY  . Multiple Vitamin (MULTIVITAMIN WITH MINERALS) TABS tablet Take 1 tablet by mouth daily.  Marland Kitchen MYRBETRIQ 25 MG TB24 tablet Take 25 mg by mouth daily.  . nitroGLYCERIN (NITROSTAT) 0.4 MG SL tablet Place 1 tablet (0.4 mg total) under the tongue every 5 (five) minutes as needed for chest pain.  . pantoprazole (PROTONIX) 40 MG tablet Take 40 mg by mouth daily.  . pravastatin (PRAVACHOL) 40 MG tablet TAKE 1 TABLET BY MOUTH EVERY DAY  . risedronate (ACTONEL) 150 MG tablet Take 150 mg by mouth every 30 (thirty) days.  Marland Kitchen zinc gluconate 50 MG tablet Take 50 mg by mouth daily.  Allergies:   Prednisone and Hydralazine hcl   Social History   Socioeconomic History  . Marital status: Married    Spouse name: Not on file  . Number of children: Not on file  . Years of education: Not on file  . Highest education level: Not on file  Occupational History  . Not on file  Tobacco Use  . Smoking status: Never Smoker  . Smokeless tobacco: Never Used  Vaping Use  . Vaping Use: Never used  Substance and Sexual Activity  . Alcohol use: No  . Drug use: No  . Sexual activity: Not Currently  Other Topics Concern  . Not on file  Social History Narrative  . Not on file   Social Determinants of Health   Financial Resource Strain: Not on file  Food Insecurity: Not on file  Transportation Needs: Not on file  Physical Activity: Not on file  Stress: Not on file  Social Connections: Not on file     Family History: The patient's family history includes Atrial  fibrillation in an other family member; Congestive Heart Failure in her mother; Hypertension in her mother and another family member. ROS:   Please see the history of present illness.    All other systems reviewed and are negative.  EKGs/Labs/Other Studies Reviewed:    The following studies were reviewed today:    Recent Labs: 12/20/2020: Creatinine 1.32 BUN 31 TSH normal 1.01  Physical Exam:    VS:  BP 124/62 (BP Location: Right Arm, Patient Position: Sitting)   Pulse 72   Ht 5' (1.524 m)   Wt 162 lb 9.6 oz (73.8 kg)   SpO2 96%   BMI 31.76 kg/m     Wt Readings from Last 3 Encounters:  12/27/20 162 lb 9.6 oz (73.8 kg)  12/16/20 159 lb (72.1 kg)  10/18/20 159 lb (72.1 kg)     GEN:  Well nourished, well developed in no acute distress HEENT: Normal NECK: No JVD; No carotid bruits LYMPHATICS: No lymphadenopathy CARDIAC: RRR, no murmurs, rubs, gallops RESPIRATORY:  Clear to auscultation without rales, wheezing or rhonchi  ABDOMEN: Soft, non-tender, non-distended MUSCULOSKELETAL:  No edema; No deformity  SKIN: Warm and dry NEUROLOGIC:  Alert and oriented x 3 PSYCHIATRIC:  Normal affect    Signed, Shirlee More, MD  12/27/2020 10:18 AM    [facility]

## 2020-12-27 NOTE — Patient Instructions (Signed)

## 2021-02-14 DIAGNOSIS — H9311 Tinnitus, right ear: Secondary | ICD-10-CM | POA: Diagnosis not present

## 2021-02-14 DIAGNOSIS — H6981 Other specified disorders of Eustachian tube, right ear: Secondary | ICD-10-CM | POA: Diagnosis not present

## 2021-02-15 ENCOUNTER — Telehealth: Payer: Self-pay

## 2021-02-15 ENCOUNTER — Other Ambulatory Visit: Payer: Self-pay | Admitting: Cardiology

## 2021-02-15 NOTE — Telephone Encounter (Signed)
Received a call from the patient stating she received a call from the pharmacy stating Dr Bettina Gavia has denied her rx. She wanted to know why. She was requesting to speak with Dr Joya Gaskins nurse.   I advised patient to hold on while I reviewed her chart.   Upon review I saw that both Imdur and Pravastatin have both been sent to the pharmacy. I informed patient and advised her to contact her pharmacy. She voiced understanding and stated that was all she needed to know and thanked me for my help.

## 2021-02-28 ENCOUNTER — Other Ambulatory Visit: Payer: Self-pay | Admitting: Cardiology

## 2021-03-14 DIAGNOSIS — R001 Bradycardia, unspecified: Secondary | ICD-10-CM | POA: Diagnosis not present

## 2021-03-14 DIAGNOSIS — I10 Essential (primary) hypertension: Secondary | ICD-10-CM | POA: Diagnosis not present

## 2021-03-14 DIAGNOSIS — N3281 Overactive bladder: Secondary | ICD-10-CM | POA: Diagnosis not present

## 2021-03-14 DIAGNOSIS — I509 Heart failure, unspecified: Secondary | ICD-10-CM | POA: Diagnosis not present

## 2021-03-14 DIAGNOSIS — D509 Iron deficiency anemia, unspecified: Secondary | ICD-10-CM | POA: Diagnosis not present

## 2021-03-14 DIAGNOSIS — E785 Hyperlipidemia, unspecified: Secondary | ICD-10-CM | POA: Diagnosis not present

## 2021-03-14 DIAGNOSIS — Z6832 Body mass index (BMI) 32.0-32.9, adult: Secondary | ICD-10-CM | POA: Diagnosis not present

## 2021-03-14 DIAGNOSIS — K219 Gastro-esophageal reflux disease without esophagitis: Secondary | ICD-10-CM | POA: Diagnosis not present

## 2021-03-14 DIAGNOSIS — E1159 Type 2 diabetes mellitus with other circulatory complications: Secondary | ICD-10-CM | POA: Diagnosis not present

## 2021-03-14 DIAGNOSIS — N1831 Chronic kidney disease, stage 3a: Secondary | ICD-10-CM | POA: Diagnosis not present

## 2021-04-04 DIAGNOSIS — Z1231 Encounter for screening mammogram for malignant neoplasm of breast: Secondary | ICD-10-CM | POA: Diagnosis not present

## 2021-04-04 DIAGNOSIS — Z01419 Encounter for gynecological examination (general) (routine) without abnormal findings: Secondary | ICD-10-CM | POA: Diagnosis not present

## 2021-04-06 DIAGNOSIS — D225 Melanocytic nevi of trunk: Secondary | ICD-10-CM | POA: Diagnosis not present

## 2021-04-06 DIAGNOSIS — L821 Other seborrheic keratosis: Secondary | ICD-10-CM | POA: Diagnosis not present

## 2021-05-27 ENCOUNTER — Other Ambulatory Visit: Payer: Self-pay | Admitting: Cardiology

## 2021-06-27 NOTE — Progress Notes (Signed)
Cardiology Office Note:    Date:  06/28/2021   ID:  Lindsey Louise Pennock, [place] Feb 05, 1941, MRN [id number]  PCP:  Lowella Dandy, NP  Cardiologist:  Shirlee More, MD    Referring MD: Lowella Dandy, NP    ASSESSMENT:    1. Hypertensive heart disease with heart failure ([place])   2. Coronary artery disease involving native coronary artery of native heart with angina pectoris ([place])   3. Frequent PVCs   4. Mixed hyperlipidemia   5. Other iron deficiency anemia    PLAN:    In order of problems listed above:  Stable Home blood pressure invariably is at target and I have little faith in office blood pressure measurements.  She will continue to trend her blood pressure at home continue her current antihypertensives including ARB thiazide diuretic her heart failure is compensated she has no edema continue home self management and her minimal dose of diuretic. Stable CAD having no anginal discomfort following PCI and current medical therapy at this time would not pursue an ischemic evaluation reduce aspirin 81 mg several times daily and continue oral nitrate and statin Improved see comments under history Lipids at target continue her statin with CAD she is having no muscle pain or weakness Reduce her iron to 1 tablet every other day to avoid iron blockade seen with daily and high-dose iron.   Next appointment: 9 months   Medication Adjustments/Labs and Tests Ordered: Current medicines are reviewed at length with the patient today.  Concerns regarding medicines are outlined above.  No orders of the defined types were placed in this encounter.  Meds ordered this encounter  Medications   nitroGLYCERIN (NITROSTAT) 0.4 MG SL tablet    Sig: Place 1 tablet (0.4 mg total) under the tongue every 5 (five) minutes as needed for chest pain.    Dispense:  25 tablet    Refill:  6     Chief Complaint  Patient presents with   Follow-up   Coronary Artery Disease   Congestive Heart Failure   History  of Present Illness:    Lindsey Frost is a 80 y.o. female with a hx of CAD with PCI Taxus stent his LAD in 2006 dyslipidemia hypertensive heart disease with chronic diastolic heart failure iron deficiency anemia frequent PVCs and resting sinus bradycardia last seen 12/27/2020.She performed a 14-day event monitor reported 11/18/2020 which showed frequent ventricular ectopy with a PVC burden 26.5% with episodes of bigeminy longest 1 hour 32 minutes and no significant bradycardia. Myocardial perfusion study 12/16/2020 shows EF 70% normal perfusion and function.  Compliance with diet, lifestyle and medications: Yes  Overall she is doing better palpitation no more bradycardia resolved she is off myrbetriq which may have played a role\Home blood pressure runs consistently in the 130s-90.  She still takes iron I asked her to reduce it to 1 tablet every other day. Weight at home is stable she has no edema shortness of breath She has had no anginal discomfort should be given a new prescription for nitroglycerin Recent labs are good her lipids are at target cholesterol 03/14/2021 125 LDL 89 A1c was elevated 8.5 hemoglobin 15.2 creatinine 1.17  Past Medical History:  Diagnosis Date   Acute kidney injury ([place]) 09/04/2015   Altered awareness, transient 02/08/2012   Anemia 12/08/2016   Arthritis    OA AND PAIN LEFT KNEE  AND ARTHRITIS IN FINGERS   Atherosclerotic heart disease of native coronary artery without angina pectoris 02/08/2012   Overview:  Overview:  PCI and Taxus stent to LAD 2006  Formatting of this note might be different from the original. PCI and Taxus stent to LAD 2006   Bradycardia with 41-50 beats per minute 08/22/2018   Cancer ([place])    UTERINE CANCER - S/P HYSTERECTOMY   CHF exacerbation ([place]) 02/08/2012   Chronic diastolic CHF (congestive heart failure) ([place]) 09/04/2015   Chronic diastolic heart failure ([place]) 02/08/2012   Coronary artery disease    Followed by Dr.Ashana Tullo   Coronary  atherosclerosis of native coronary artery 02/08/2012   Diabetes mellitus    Essential hypertension, benign 02/08/2012   ETD (Eustachian tube dysfunction), right 01/01/2018   Frequent PVCs 12/08/2016   GERD (gastroesophageal reflux disease)    Hiatal hernia 12/26/2019   Hypercalcemia 09/04/2015   Hyperlipidemia    Hypertension    Hypertensive heart disease with heart failure ([place]) 12/19/2015   Left hip pain 09/04/2015   Leukocytosis 09/04/2015   LVH (left ventricular hypertrophy) due to hypertensive disease, with heart failure ([place]) 12/08/2016   Mixed hyperlipidemia 02/08/2012   Myocardial infarction ([place]) JULY 2006   [place]   Occult blood positive stool 12/08/2016   OSA (obstructive sleep apnea) 12/08/2016   Peripheral edema 12/08/2016   S/P left TKA 02/06/2012   SIRS (systemic inflammatory response syndrome) ([place]) 09/04/2015   Sleep apnea    PT HAS CPAP MASK AND MACHINE AT HOME--BUT DOES NOT USE-COULD NOT TOLERATE   Stage 3a chronic kidney disease ([place]) 07/21/2019   Tinnitus of right ear 08/16/2020   Type 2 diabetes mellitus with circulatory disorder ([place]) 09/04/2015    Past Surgical History:  Procedure Laterality Date   ABDOMINAL HYSTERECTOMY  2000   UTERINE CANCER   BILATERAL CATARACT EXTRACTIONS  2005   CORONARY ANGIOPLASTY  2012   LEFT KNEE ARTHROSCOPY  2012   TONSILLECTOMY  1945   TOTAL KNEE ARTHROPLASTY  02/05/2012   Procedure: TOTAL KNEE ARTHROPLASTY;  Surgeon: Mauri Pole, MD;  Location: [facility];  Service: Orthopedics;  Laterality: Left;   TYMPANOSTOMY TUBE PLACEMENT Right 07/22/2018    Current Medications: Current Meds  Medication Sig   amLODipine (NORVASC) 10 MG tablet Take 10 mg by mouth daily. For blood pressure   aspirin EC 81 MG tablet Take 81 mg by mouth in the morning and at bedtime.   Calcium Carb-Cholecalciferol 600-800 MG-UNIT TABS Take 1 tablet by mouth daily.   canagliflozin (INVOKANA) 100 MG TABS tablet Take 100 mg by mouth daily with  breakfast.   Candesartan Cilexetil-HCTZ 32-25 MG TABS Take 1 tablet once a day for blood pressure and heart   clotrimazole-betamethasone (LOTRISONE) cream Apply 1 application topically 2 (two) times daily as needed ([place]).   Cyanocobalamin (VITAMIN B-12) 5000 MCG TBDP Take 5,000 mcg by mouth daily.   Fe Fum-FA-B Cmp-C-Zn-Mg-Mn-Cu (FERROCITE PLUS) 106-1 MG TABS Take 1 tablet by mouth every other day.   furosemide (LASIX) 20 MG tablet TAKE 1 TABLET BY MOUTH EVERY OTHER DAY. TAKE ADDITIONAL DOSE ON DAYS WEIGHT IS GREATER THAN 171 POUNDS   glimepiride (AMARYL) 4 MG tablet Take 4 mg by mouth daily with breakfast.    isosorbide mononitrate (IMDUR) 60 MG 24 hr tablet TAKE 1 TABLET(60 MG) BY MOUTH DAILY   JANUMET 50-1000 MG tablet TAKE 1 TABLET BY MOUTH DAILY (Patient taking differently: Take 0.5 tablets by mouth in the morning and at bedtime.)   Multiple Vitamin (MULTIVITAMIN WITH MINERALS) TABS tablet Take 1 tablet by mouth daily.   pantoprazole (  PROTONIX) 40 MG tablet Take 40 mg by mouth daily.   pravastatin (PRAVACHOL) 40 MG tablet TAKE 1 TABLET BY MOUTH EVERY DAY   risedronate (ACTONEL) 150 MG tablet Take 150 mg by mouth every 30 (thirty) days.   zinc gluconate 50 MG tablet Take 50 mg by mouth daily.   [DISCONTINUED] nitroGLYCERIN (NITROSTAT) 0.4 MG SL tablet Place 1 tablet (0.4 mg total) under the tongue every 5 (five) minutes as needed for chest pain.     Allergies:   Prednisone and Hydralazine hcl   Social History   Socioeconomic History   Marital status: Married    Spouse name: Not on file   Number of children: Not on file   Years of education: Not on file   Highest education level: Not on file  Occupational History   Not on file  Tobacco Use   Smoking status: Never   Smokeless tobacco: Never  Vaping Use   Vaping Use: Never used  Substance and Sexual Activity   Alcohol use: No   Drug use: No   Sexual activity: Not Currently  Other Topics Concern   Not on file  Social History  Narrative   Not on file   Social Determinants of Health   Financial Resource Strain: Not on file  Food Insecurity: Not on file  Transportation Needs: Not on file  Physical Activity: Not on file  Stress: Not on file  Social Connections: Not on file     Family History: The patient's family history includes Atrial fibrillation in an other family member; Congestive Heart Failure in her mother; Hypertension in her mother and another family member. ROS:   Please see the history of present illness.    All other systems reviewed and are negative.  EKGs/Labs/Other Studies Reviewed:    The following studies were reviewed today:  EKG:  EKG last visit 10/22/2020 showed ventricular bigeminy old anterior septal MI  Recent Labs: See history  Physical Exam:    VS:  BP (!) 166/80   Pulse 66   Ht 5\' 1"  (1.549 m)   Wt 161 lb 9.6 oz (73.3 kg)   SpO2 96%   BMI 30.53 kg/m     Wt Readings from Last 3 Encounters:  06/28/21 161 lb 9.6 oz (73.3 kg)  12/27/20 162 lb 9.6 oz (73.8 kg)  12/16/20 159 lb (72.1 kg)     GEN: Appears her age well nourished, well developed in no acute distress HEENT: Normal NECK: No JVD; No carotid bruits LYMPHATICS: No lymphadenopathy CARDIAC: RRR, no murmurs, rubs, gallops RESPIRATORY:  Clear to auscultation without rales, wheezing or rhonchi  ABDOMEN: Soft, non-tender, non-distended MUSCULOSKELETAL:  No edema; No deformity  SKIN: Warm and dry NEUROLOGIC:  Alert and oriented x 3 PSYCHIATRIC:  Normal affect    Signed, Shirlee More, MD  06/28/2021 9:15 AM    [place]

## 2021-06-28 ENCOUNTER — Encounter: Payer: Self-pay | Admitting: Cardiology

## 2021-06-28 ENCOUNTER — Ambulatory Visit: Payer: HMO | Admitting: Cardiology

## 2021-06-28 ENCOUNTER — Other Ambulatory Visit: Payer: Self-pay

## 2021-06-28 VITALS — BP 166/80 | HR 66 | Ht 61.0 in | Wt 161.6 lb

## 2021-06-28 DIAGNOSIS — D508 Other iron deficiency anemias: Secondary | ICD-10-CM | POA: Diagnosis not present

## 2021-06-28 DIAGNOSIS — I25119 Atherosclerotic heart disease of native coronary artery with unspecified angina pectoris: Secondary | ICD-10-CM | POA: Diagnosis not present

## 2021-06-28 DIAGNOSIS — E782 Mixed hyperlipidemia: Secondary | ICD-10-CM | POA: Diagnosis not present

## 2021-06-28 DIAGNOSIS — I493 Ventricular premature depolarization: Secondary | ICD-10-CM

## 2021-06-28 DIAGNOSIS — I110 Hypertensive heart disease with heart failure: Secondary | ICD-10-CM

## 2021-06-28 MED ORDER — ASPIRIN EC 81 MG PO TBEC: 81.0000 mg | DELAYED_RELEASE_TABLET | Freq: Every day | ORAL | Status: AC

## 2021-06-28 MED ORDER — NITROGLYCERIN 0.4 MG SL SUBL: 0.4000 mg | SUBLINGUAL_TABLET | SUBLINGUAL | 6 refills | Status: DC | PRN

## 2021-06-28 NOTE — Patient Instructions (Addendum)
Medication Instructions:  Your physician has recommended you make the following change in your medication:   Decrease your Fe to 1 tablet every other day.  Decrease your aspirin 81 mg daily.  Use nitroglycerin 1 tablet placed under the tongue at the first sign of chest pain or an angina attack. 1 tablet may be used every 5 minutes as needed, for up to 15 minutes. Do not take more than 3 tablets in 15 minutes. If pain persist call 911 or go to the nearest ED.   *If you need a refill on your cardiac medications before your next appointment, please call your pharmacy*   Lab Work: None ordered If you have labs (blood work) drawn today and your tests are completely normal, you will receive your results only by: [place] (if you have MyChart) OR A paper copy in the mail If you have any lab test that is abnormal or we need to change your treatment, we will call you to review the results.   Testing/Procedures: None ordered   Follow-Up: At [facility], you and your health needs are our priority.  As part of our continuing mission to provide you with exceptional heart care, we have created designated Provider Care Teams.  These Care Teams include your primary Cardiologist (physician) and Advanced Practice Providers (APPs -  Physician Assistants and Nurse Practitioners) who all work together to provide you with the care you need, when you need it.  We recommend signing up for the patient portal called "MyChart".  Sign up information is provided on this After Visit Summary.  MyChart is used to connect with patients for Virtual Visits (Telemedicine).  Patients are able to view lab/test results, encounter notes, upcoming appointments, etc.  Non-urgent messages can be sent to your provider as well.   To learn more about what you can do with MyChart, go to [email or web address].    Your next appointment:   6 month(s)  The format for your next appointment:   In Person  Provider:    Shirlee More, MD   Other Instructions NA

## 2021-07-11 DIAGNOSIS — I2510 Atherosclerotic heart disease of native coronary artery without angina pectoris: Secondary | ICD-10-CM | POA: Diagnosis not present

## 2021-07-11 DIAGNOSIS — I509 Heart failure, unspecified: Secondary | ICD-10-CM | POA: Diagnosis not present

## 2021-07-11 DIAGNOSIS — I10 Essential (primary) hypertension: Secondary | ICD-10-CM | POA: Diagnosis not present

## 2021-07-11 DIAGNOSIS — N3281 Overactive bladder: Secondary | ICD-10-CM | POA: Diagnosis not present

## 2021-07-11 DIAGNOSIS — E785 Hyperlipidemia, unspecified: Secondary | ICD-10-CM | POA: Diagnosis not present

## 2021-07-11 DIAGNOSIS — Z6831 Body mass index (BMI) 31.0-31.9, adult: Secondary | ICD-10-CM | POA: Diagnosis not present

## 2021-07-11 DIAGNOSIS — E1159 Type 2 diabetes mellitus with other circulatory complications: Secondary | ICD-10-CM | POA: Diagnosis not present

## 2021-07-11 DIAGNOSIS — K219 Gastro-esophageal reflux disease without esophagitis: Secondary | ICD-10-CM | POA: Diagnosis not present

## 2021-07-11 DIAGNOSIS — D509 Iron deficiency anemia, unspecified: Secondary | ICD-10-CM | POA: Diagnosis not present

## 2021-07-11 DIAGNOSIS — N1831 Chronic kidney disease, stage 3a: Secondary | ICD-10-CM | POA: Diagnosis not present

## 2021-08-05 DIAGNOSIS — I10 Essential (primary) hypertension: Secondary | ICD-10-CM | POA: Diagnosis not present

## 2021-08-05 DIAGNOSIS — E1165 Type 2 diabetes mellitus with hyperglycemia: Secondary | ICD-10-CM | POA: Diagnosis not present

## 2021-08-05 DIAGNOSIS — J449 Chronic obstructive pulmonary disease, unspecified: Secondary | ICD-10-CM | POA: Diagnosis not present

## 2021-08-15 DIAGNOSIS — H6981 Other specified disorders of Eustachian tube, right ear: Secondary | ICD-10-CM | POA: Diagnosis not present

## 2021-08-19 ENCOUNTER — Other Ambulatory Visit: Payer: Self-pay | Admitting: Cardiology

## 2021-09-05 DIAGNOSIS — Z7984 Long term (current) use of oral hypoglycemic drugs: Secondary | ICD-10-CM | POA: Diagnosis not present

## 2021-09-05 DIAGNOSIS — E119 Type 2 diabetes mellitus without complications: Secondary | ICD-10-CM | POA: Diagnosis not present

## 2021-09-05 DIAGNOSIS — H524 Presbyopia: Secondary | ICD-10-CM | POA: Diagnosis not present

## 2021-09-05 DIAGNOSIS — H52203 Unspecified astigmatism, bilateral: Secondary | ICD-10-CM | POA: Diagnosis not present

## 2021-09-05 DIAGNOSIS — Z961 Presence of intraocular lens: Secondary | ICD-10-CM | POA: Diagnosis not present

## 2021-09-05 DIAGNOSIS — H5213 Myopia, bilateral: Secondary | ICD-10-CM | POA: Diagnosis not present

## 2021-10-04 DIAGNOSIS — N1831 Chronic kidney disease, stage 3a: Secondary | ICD-10-CM | POA: Diagnosis not present

## 2021-10-04 DIAGNOSIS — I10 Essential (primary) hypertension: Secondary | ICD-10-CM | POA: Diagnosis not present

## 2021-10-04 DIAGNOSIS — I5032 Chronic diastolic (congestive) heart failure: Secondary | ICD-10-CM | POA: Diagnosis not present

## 2021-11-25 ENCOUNTER — Other Ambulatory Visit: Payer: Self-pay | Admitting: Cardiology

## 2021-12-04 DIAGNOSIS — N1831 Chronic kidney disease, stage 3a: Secondary | ICD-10-CM | POA: Diagnosis not present

## 2021-12-04 DIAGNOSIS — I5032 Chronic diastolic (congestive) heart failure: Secondary | ICD-10-CM | POA: Diagnosis not present

## 2021-12-04 DIAGNOSIS — I10 Essential (primary) hypertension: Secondary | ICD-10-CM | POA: Diagnosis not present

## 2021-12-08 ENCOUNTER — Other Ambulatory Visit: Payer: Self-pay

## 2021-12-08 ENCOUNTER — Other Ambulatory Visit: Payer: Self-pay | Admitting: Cardiology

## 2021-12-08 MED ORDER — ISOSORBIDE MONONITRATE ER 60 MG PO TB24: ORAL_TABLET | ORAL | 2 refills | Status: DC

## 2021-12-08 NOTE — Telephone Encounter (Signed)
Isosorbide mononitrate 60 mg ER # 90 x 2 refills sent to [facility] [address] T[place] Alert [place] ?

## 2021-12-19 DIAGNOSIS — K219 Gastro-esophageal reflux disease without esophagitis: Secondary | ICD-10-CM | POA: Diagnosis not present

## 2021-12-19 DIAGNOSIS — I5032 Chronic diastolic (congestive) heart failure: Secondary | ICD-10-CM | POA: Diagnosis not present

## 2021-12-19 DIAGNOSIS — I10 Essential (primary) hypertension: Secondary | ICD-10-CM | POA: Diagnosis not present

## 2021-12-19 DIAGNOSIS — I2510 Atherosclerotic heart disease of native coronary artery without angina pectoris: Secondary | ICD-10-CM | POA: Diagnosis not present

## 2021-12-19 DIAGNOSIS — D509 Iron deficiency anemia, unspecified: Secondary | ICD-10-CM | POA: Diagnosis not present

## 2021-12-19 DIAGNOSIS — N1831 Chronic kidney disease, stage 3a: Secondary | ICD-10-CM | POA: Diagnosis not present

## 2021-12-19 DIAGNOSIS — E785 Hyperlipidemia, unspecified: Secondary | ICD-10-CM | POA: Diagnosis not present

## 2021-12-19 DIAGNOSIS — E1159 Type 2 diabetes mellitus with other circulatory complications: Secondary | ICD-10-CM | POA: Diagnosis not present

## 2021-12-19 DIAGNOSIS — N3281 Overactive bladder: Secondary | ICD-10-CM | POA: Diagnosis not present

## 2022-01-14 NOTE — Progress Notes (Signed)
Cardiology Office Note:    Date:  01/16/2022   ID:  Lindsey Louise [place]ff[place], [place] Mar 26, 1941, MRN [id number]  PCP:  Lowella Dandy, NP  Cardiologist:  Shirlee More, MD    Referring MD: Lowella Dandy, NP    ASSESSMENT:    1. Hypertensive heart disease with heart failure ([place])   2. Coronary artery disease involving native coronary artery of native heart with angina pectoris ([place])   3. Frequent PVCs   4. Mixed hyperlipidemia   5. Other iron deficiency anemia    PLAN:    In order of problems listed above:  She continues to do well heart failure compensated no fluid overload continue her current diuretic and antihypertensives including calcium channel blocker Stable CAD no anginal discomfort continue medical treatment including aspirin calcium channel blocker Imdur and statin and avoid beta-blockers with previous bradycardia Stable asymptomatic Lipids at target continue pravastatin Continue iron every other day   Next appointment: 9 months   Medication Adjustments/Labs and Tests Ordered: Current medicines are reviewed at length with the patient today.  Concerns regarding medicines are outlined above.  No orders of the defined types were placed in this encounter.  No orders of the defined types were placed in this encounter.   Chief Complaint  Patient presents with   Follow-up   Coronary Artery Disease   Congestive Heart Failure    History of Present Illness:    Lindsey Frost is a 81 y.o. female with a hx of CAD with PCI and Taxus stent LAD 2006 hypertensive heart disease with chronic diastolic heart failure dyslipidemia iron deficiency anemia frequent PVCs and sinus bradycardia last seen 06/28/2021.  She was improved after discontinuing myrbetrtriq which worsened bradycardia she performed a 14-day event monitor reported 11/18/2020 which showed frequent ventricular ectopy with a PVC burden 26.5% with episodes of bigeminy longest 1 hour 32 minutes and no significant  bradycardia. Myocardial perfusion study 12/16/2020 shows EF 70% normal perfusion and function .  Compliance with diet, lifestyle and medications: Yes  She continues to do well her weight is down in the range of 40 pounds and will revise her weight 163 pounds to take an extra dose of furosemide. She is not having edema shortness of breath orthopnea chest pain palpitation or syncope. Recent labs with her PCP shows a cholesterol 152 LDL 84 A1c 9.4% hemoglobin 15.6 creatinine 1.19 potassium 4.2 on 12/19/2021 Past Medical History:  Diagnosis Date   Acute kidney injury ([place]) 09/04/2015   Altered awareness, transient 02/08/2012   Anemia 12/08/2016   Arthritis    OA AND PAIN LEFT KNEE  AND ARTHRITIS IN FINGERS   Atherosclerotic heart disease of native coronary artery without angina pectoris 02/08/2012   Overview:  Overview:  PCI and Taxus stent to LAD 2006  Formatting of this note might be different from the original. PCI and Taxus stent to LAD 2006   Bradycardia with 41-50 beats per minute 08/22/2018   Cancer ([place])    UTERINE CANCER - S/P HYSTERECTOMY   CHF exacerbation ([place]) 02/08/2012   Chronic diastolic CHF (congestive heart failure) ([place]) 09/04/2015   Chronic diastolic heart failure ([place]) 02/08/2012   Coronary artery disease    Followed by Dr.Deveron Shamoon   Coronary atherosclerosis of native coronary artery 02/08/2012   Diabetes mellitus    Essential hypertension, benign 02/08/2012   ETD (Eustachian tube dysfunction), right 01/01/2018   Frequent PVCs 12/08/2016   GERD (gastroesophageal reflux disease)    Hiatal hernia 12/26/2019   Hypercalcemia 09/04/2015  Hyperlipidemia    Hypertension    Hypertensive heart disease with heart failure ([place]) 12/19/2015   Left hip pain 09/04/2015   Leukocytosis 09/04/2015   LVH (left ventricular hypertrophy) due to hypertensive disease, with heart failure ([place]) 12/08/2016   Mixed hyperlipidemia 02/08/2012   Myocardial infarction ([place]) JULY 2006   [place]    Occult blood positive stool 12/08/2016   OSA (obstructive sleep apnea) 12/08/2016   Peripheral edema 12/08/2016   S/P left TKA 02/06/2012   SIRS (systemic inflammatory response syndrome) ([facility]) 09/04/2015   Sleep apnea    PT HAS CPAP MASK AND MACHINE AT HOME--BUT DOES NOT USE-COULD NOT TOLERATE   Stage 3a chronic kidney disease ([place]) 07/21/2019   Tinnitus of right ear 08/16/2020   Type 2 diabetes mellitus with circulatory disorder ([place]) 09/04/2015    Past Surgical History:  Procedure Laterality Date   ABDOMINAL HYSTERECTOMY  2000   UTERINE CANCER   BILATERAL CATARACT EXTRACTIONS  2005   CORONARY ANGIOPLASTY  2012   LEFT KNEE ARTHROSCOPY  2012   TONSILLECTOMY  1945   TOTAL KNEE ARTHROPLASTY  02/05/2012   Procedure: TOTAL KNEE ARTHROPLASTY;  Surgeon: Mauri Pole, MD;  Location: [facility];  Service: Orthopedics;  Laterality: Left;   TYMPANOSTOMY TUBE PLACEMENT Right 07/22/2018    Current Medications: Current Meds  Medication Sig   amLODipine (NORVASC) 10 MG tablet Take 10 mg by mouth daily. For blood pressure   aspirin EC 81 MG tablet Take 1 tablet (81 mg total) by mouth daily.   Cholecalciferol-Vitamin C (VITAMIN D3-VITAMIN C PO) Take 1 tablet by mouth daily.   dapagliflozin propanediol (FARXIGA) 10 MG TABS tablet Take 10 mg by mouth daily.   Fe Fum-FA-B Cmp-C-Zn-Mg-Mn-Cu (FERROCITE PLUS) 106-1 MG TABS Take 1 tablet by mouth every other day.   furosemide (LASIX) 20 MG tablet TAKE 1 TABLET BY MOUTH EVERY OTHER DAY. TAKE ADDITIONAL DOSE ON DAYS WEIGHT IS GREATER THAN 171LBS   glimepiride (AMARYL) 4 MG tablet Take 4 mg by mouth daily with breakfast.    isosorbide mononitrate (IMDUR) 60 MG 24 hr tablet TAKE 1 TABLET(60 MG) BY MOUTH DAILY   nitroGLYCERIN (NITROSTAT) 0.4 MG SL tablet Place 1 tablet (0.4 mg total) under the tongue every 5 (five) minutes as needed for chest pain.   pantoprazole (PROTONIX) 40 MG tablet Take 40 mg by mouth daily.   pravastatin (PRAVACHOL) 40 MG tablet Take 1 tablet  (40 mg total) by mouth daily.   risedronate (ACTONEL) 150 MG tablet Take 150 mg by mouth every 30 (thirty) days.   Semaglutide (RYBELSUS) 3 MG TABS Take 3 mg by mouth in the morning.   sitaGLIPtin-metformin (JANUMET) 50-1000 MG tablet Take 0.5 tablets by mouth in the morning and at bedtime.   zinc gluconate 50 MG tablet Take 50 mg by mouth daily.     Allergies:   Prednisone and Hydralazine hcl   Social History   Socioeconomic History   Marital status: Married    Spouse name: Not on file   Number of children: Not on file   Years of education: Not on file   Highest education level: Not on file  Occupational History   Not on file  Tobacco Use   Smoking status: Never   Smokeless tobacco: Never  Vaping Use   Vaping Use: Never used  Substance and Sexual Activity   Alcohol use: No   Drug use: No   Sexual activity: Not Currently  Other Topics Concern  Not on file  Social History Narrative   Not on file   Social Determinants of Health   Financial Resource Strain: Not on file  Food Insecurity: Not on file  Transportation Needs: Not on file  Physical Activity: Not on file  Stress: Not on file  Social Connections: Not on file     Family History: The patient's family history includes Atrial fibrillation in an other family member; Congestive Heart Failure in her mother; Hypertension in her mother and another family member. ROS:   Please see the history of present illness.    All other systems reviewed and are negative.  EKGs/Labs/Other Studies Reviewed:    The following studies were reviewed today:  See history  Physical Exam:    VS:  BP 140/60 (BP Location: Left Arm, Patient Position: Sitting, Cuff Size: Normal)   Pulse 67   Ht '5\' 1"'$  (1.549 m)   Wt 156 lb (70.8 kg)   SpO2 94%   BMI 29.48 kg/m     Wt Readings from Last 3 Encounters:  01/16/22 156 lb (70.8 kg)  06/28/21 161 lb 9.6 oz (73.3 kg)  12/27/20 162 lb 9.6 oz (73.8 kg)     GEN: She has no pallor of the  skin or membranes well nourished, well developed in no acute distress HEENT: Normal NECK: No JVD; No carotid bruits LYMPHATICS: No lymphadenopathy CARDIAC: \RRR, no murmurs, rubs, gallops RESPIRATORY:  Clear to auscultation without rales, wheezing or rhonchi  ABDOMEN: Soft, non-tender, non-distended MUSCULOSKELETAL:  No edema; No deformity  SKIN: Warm and dry NEUROLOGIC:  Alert and oriented x 3 PSYCHIATRIC:  Normal affect    Signed, Shirlee More, MD  01/16/2022 9:41 AM    [place]

## 2022-01-16 ENCOUNTER — Encounter: Payer: Self-pay | Admitting: Cardiology

## 2022-01-16 ENCOUNTER — Ambulatory Visit: Payer: HMO | Admitting: Cardiology

## 2022-01-16 VITALS — BP 140/60 | HR 67 | Ht 61.0 in | Wt 156.0 lb

## 2022-01-16 DIAGNOSIS — I25119 Atherosclerotic heart disease of native coronary artery with unspecified angina pectoris: Secondary | ICD-10-CM

## 2022-01-16 DIAGNOSIS — I493 Ventricular premature depolarization: Secondary | ICD-10-CM

## 2022-01-16 DIAGNOSIS — D508 Other iron deficiency anemias: Secondary | ICD-10-CM

## 2022-01-16 DIAGNOSIS — E782 Mixed hyperlipidemia: Secondary | ICD-10-CM

## 2022-01-16 DIAGNOSIS — I110 Hypertensive heart disease with heart failure: Secondary | ICD-10-CM | POA: Diagnosis not present

## 2022-01-16 MED ORDER — FUROSEMIDE 20 MG PO TABS: 20.0000 mg | ORAL_TABLET | ORAL | 3 refills | Status: DC

## 2022-01-16 NOTE — Patient Instructions (Signed)
Medication Instructions:  Your physician has recommended you make the following change in your medication:   START: Furosemide 20 mg every other day (Take Furosemide on off days if weight is greater than 163 lbs.   *If you need a refill on your cardiac medications before your next appointment, please call your pharmacy*   Lab Work: None If you have labs (blood work) drawn today and your tests are completely normal, you will receive your results only by: [place] (if you have MyChart) OR A paper copy in the mail If you have any lab test that is abnormal or we need to change your treatment, we will call you to review the results.   Testing/Procedures: None   Follow-Up: At [facility], you and your health needs are our priority.  As part of our continuing mission to provide you with exceptional heart care, we have created designated Provider Care Teams.  These Care Teams include your primary Cardiologist (physician) and Advanced Practice Providers (APPs -  Physician Assistants and Nurse Practitioners) who all work together to provide you with the care you need, when you need it.  We recommend signing up for the patient portal called "MyChart".  Sign up information is provided on this After Visit Summary.  MyChart is used to connect with patients for Virtual Visits (Telemedicine).  Patients are able to view lab/test results, encounter notes, upcoming appointments, etc.  Non-urgent messages can be sent to your provider as well.   To learn more about what you can do with MyChart, go to [email or web address].    Your next appointment:   9 month(s)  The format for your next appointment:   In Person  Provider:   Shirlee More, MD    Other Instructions Take OTC Magnesium 200 - 400 meq  Important Information About Sugar

## 2022-01-20 DIAGNOSIS — E1159 Type 2 diabetes mellitus with other circulatory complications: Secondary | ICD-10-CM | POA: Diagnosis not present

## 2022-02-13 DIAGNOSIS — H6981 Other specified disorders of Eustachian tube, right ear: Secondary | ICD-10-CM | POA: Diagnosis not present

## 2022-02-20 DIAGNOSIS — R1011 Right upper quadrant pain: Secondary | ICD-10-CM | POA: Diagnosis not present

## 2022-02-20 DIAGNOSIS — M549 Dorsalgia, unspecified: Secondary | ICD-10-CM | POA: Diagnosis not present

## 2022-04-06 DIAGNOSIS — I5032 Chronic diastolic (congestive) heart failure: Secondary | ICD-10-CM | POA: Diagnosis not present

## 2022-04-06 DIAGNOSIS — I10 Essential (primary) hypertension: Secondary | ICD-10-CM | POA: Diagnosis not present

## 2022-04-06 DIAGNOSIS — N1831 Chronic kidney disease, stage 3a: Secondary | ICD-10-CM | POA: Diagnosis not present

## 2022-04-28 DIAGNOSIS — R209 Unspecified disturbances of skin sensation: Secondary | ICD-10-CM | POA: Diagnosis not present

## 2022-04-28 DIAGNOSIS — G629 Polyneuropathy, unspecified: Secondary | ICD-10-CM | POA: Diagnosis not present

## 2022-06-19 ENCOUNTER — Telehealth: Payer: Self-pay | Admitting: Cardiology

## 2022-06-19 MED ORDER — PRAVASTATIN SODIUM 40 MG PO TABS: 40.0000 mg | ORAL_TABLET | Freq: Every day | ORAL | 1 refills | Status: DC

## 2022-06-19 NOTE — Telephone Encounter (Signed)
*  STAT* If patient is at the pharmacy, call can be transferred to refill team.   1. Which medications need to be refilled? (please list name of each medication and dose if known)   pravastatin (PRAVACHOL) 40 MG tablet   2. Which pharmacy/location (including street and city if local pharmacy) is medication to be sent to?  [phone] - [place], [address] AT [place]   3. Do they need a 30 day or 90 day supply? 90 day  Caller stated patient is completely out of this medication and has been out since Friday.

## 2022-06-20 ENCOUNTER — Other Ambulatory Visit: Payer: Self-pay

## 2022-06-20 MED ORDER — PRAVASTATIN SODIUM 40 MG PO TABS: 40.0000 mg | ORAL_TABLET | Freq: Every day | ORAL | 1 refills | Status: AC

## 2022-06-20 NOTE — Telephone Encounter (Signed)
Refill sent to pharmacy.   

## 2022-07-08 ENCOUNTER — Other Ambulatory Visit: Payer: Self-pay | Admitting: Cardiology

## 2022-08-08 ENCOUNTER — Other Ambulatory Visit: Payer: Self-pay | Admitting: Cardiology

## 2022-08-14 DIAGNOSIS — S39012A Strain of muscle, fascia and tendon of lower back, initial encounter: Secondary | ICD-10-CM | POA: Diagnosis not present

## 2022-08-14 DIAGNOSIS — M48061 Spinal stenosis, lumbar region without neurogenic claudication: Secondary | ICD-10-CM | POA: Diagnosis not present

## 2022-08-14 DIAGNOSIS — G8929 Other chronic pain: Secondary | ICD-10-CM | POA: Diagnosis not present

## 2022-08-14 DIAGNOSIS — M545 Low back pain, unspecified: Secondary | ICD-10-CM | POA: Diagnosis not present

## 2022-08-28 DIAGNOSIS — H6991 Unspecified Eustachian tube disorder, right ear: Secondary | ICD-10-CM | POA: Diagnosis not present

## 2022-09-04 DIAGNOSIS — E785 Hyperlipidemia, unspecified: Secondary | ICD-10-CM | POA: Diagnosis not present

## 2022-09-04 DIAGNOSIS — N1831 Chronic kidney disease, stage 3a: Secondary | ICD-10-CM | POA: Diagnosis not present

## 2022-09-04 DIAGNOSIS — D509 Iron deficiency anemia, unspecified: Secondary | ICD-10-CM | POA: Diagnosis not present

## 2022-09-04 DIAGNOSIS — I5032 Chronic diastolic (congestive) heart failure: Secondary | ICD-10-CM | POA: Diagnosis not present

## 2022-09-04 DIAGNOSIS — G629 Polyneuropathy, unspecified: Secondary | ICD-10-CM | POA: Diagnosis not present

## 2022-09-04 DIAGNOSIS — R200 Anesthesia of skin: Secondary | ICD-10-CM | POA: Diagnosis not present

## 2022-09-04 DIAGNOSIS — Z139 Encounter for screening, unspecified: Secondary | ICD-10-CM | POA: Diagnosis not present

## 2022-09-04 DIAGNOSIS — E1159 Type 2 diabetes mellitus with other circulatory complications: Secondary | ICD-10-CM | POA: Diagnosis not present

## 2022-09-05 DIAGNOSIS — H524 Presbyopia: Secondary | ICD-10-CM | POA: Diagnosis not present

## 2022-09-05 DIAGNOSIS — Z961 Presence of intraocular lens: Secondary | ICD-10-CM | POA: Diagnosis not present

## 2022-09-05 DIAGNOSIS — H5213 Myopia, bilateral: Secondary | ICD-10-CM | POA: Diagnosis not present

## 2022-09-05 DIAGNOSIS — E119 Type 2 diabetes mellitus without complications: Secondary | ICD-10-CM | POA: Diagnosis not present

## 2022-09-05 DIAGNOSIS — Z7984 Long term (current) use of oral hypoglycemic drugs: Secondary | ICD-10-CM | POA: Diagnosis not present

## 2022-09-05 DIAGNOSIS — H52203 Unspecified astigmatism, bilateral: Secondary | ICD-10-CM | POA: Diagnosis not present

## 2022-10-02 ENCOUNTER — Other Ambulatory Visit: Payer: Self-pay

## 2022-10-02 MED ORDER — ISOSORBIDE MONONITRATE ER 60 MG PO TB24: ORAL_TABLET | ORAL | 2 refills | Status: DC

## 2022-10-02 NOTE — Telephone Encounter (Signed)
Refills sent to pharmacy. 

## 2022-10-23 DIAGNOSIS — I10 Essential (primary) hypertension: Secondary | ICD-10-CM | POA: Diagnosis not present

## 2022-10-23 DIAGNOSIS — J069 Acute upper respiratory infection, unspecified: Secondary | ICD-10-CM | POA: Diagnosis not present

## 2022-11-20 ENCOUNTER — Ambulatory Visit: Payer: HMO | Admitting: Cardiology

## 2022-12-04 DIAGNOSIS — D509 Iron deficiency anemia, unspecified: Secondary | ICD-10-CM | POA: Diagnosis not present

## 2022-12-04 DIAGNOSIS — E785 Hyperlipidemia, unspecified: Secondary | ICD-10-CM | POA: Diagnosis not present

## 2022-12-04 DIAGNOSIS — R200 Anesthesia of skin: Secondary | ICD-10-CM | POA: Diagnosis not present

## 2022-12-04 DIAGNOSIS — E1159 Type 2 diabetes mellitus with other circulatory complications: Secondary | ICD-10-CM | POA: Diagnosis not present

## 2022-12-04 DIAGNOSIS — I5032 Chronic diastolic (congestive) heart failure: Secondary | ICD-10-CM | POA: Diagnosis not present

## 2022-12-04 DIAGNOSIS — N1831 Chronic kidney disease, stage 3a: Secondary | ICD-10-CM | POA: Diagnosis not present

## 2022-12-04 DIAGNOSIS — G629 Polyneuropathy, unspecified: Secondary | ICD-10-CM | POA: Diagnosis not present

## 2023-01-11 ENCOUNTER — Other Ambulatory Visit: Payer: Self-pay | Admitting: Cardiology

## 2023-01-11 DIAGNOSIS — M159 Polyosteoarthritis, unspecified: Secondary | ICD-10-CM | POA: Diagnosis not present

## 2023-01-11 DIAGNOSIS — M545 Low back pain, unspecified: Secondary | ICD-10-CM | POA: Diagnosis not present

## 2023-01-11 DIAGNOSIS — S39012A Strain of muscle, fascia and tendon of lower back, initial encounter: Secondary | ICD-10-CM | POA: Diagnosis not present

## 2023-01-11 DIAGNOSIS — G8929 Other chronic pain: Secondary | ICD-10-CM | POA: Diagnosis not present

## 2023-01-11 NOTE — Telephone Encounter (Signed)
Rx sent to pharmacy   

## 2023-01-16 DIAGNOSIS — S2232XA Fracture of one rib, left side, initial encounter for closed fracture: Secondary | ICD-10-CM | POA: Diagnosis not present

## 2023-01-16 DIAGNOSIS — W19XXXA Unspecified fall, initial encounter: Secondary | ICD-10-CM | POA: Diagnosis not present

## 2023-01-19 ENCOUNTER — Telehealth: Payer: Self-pay

## 2023-01-19 NOTE — Telephone Encounter (Signed)
Transition Care Management Unsuccessful Follow-up Telephone Call  Date of discharge and from where:  [facility] 6/11  Attempts:  1st Attempt  Reason for unsuccessful TCM follow-up call:  Left voice message   Lenard Forth [facility] Guide, [facility] [phone] [address] [place], [place], [place] [address] Phone: [phone] Email: [email or web address]

## 2023-01-22 ENCOUNTER — Telehealth: Payer: Self-pay

## 2023-01-22 NOTE — Telephone Encounter (Signed)
Transition Care Management Unsuccessful Follow-up Telephone Call  Date of discharge and from where:  Lindsey Frost  Attempts:  2nd Attempt  Reason for unsuccessful TCM follow-up call:  Left voice message   Lenard Forth [facility] Guide, [facility] [phone] [address] [place], [place], [place] [address] Phone: [phone] Email: [email or web address]

## 2023-01-23 DIAGNOSIS — S2232XA Fracture of one rib, left side, initial encounter for closed fracture: Secondary | ICD-10-CM | POA: Diagnosis not present

## 2023-01-23 DIAGNOSIS — S20229A Contusion of unspecified back wall of thorax, initial encounter: Secondary | ICD-10-CM | POA: Diagnosis not present

## 2023-01-23 DIAGNOSIS — S40921A Unspecified superficial injury of right upper arm, initial encounter: Secondary | ICD-10-CM | POA: Diagnosis not present

## 2023-01-23 DIAGNOSIS — I10 Essential (primary) hypertension: Secondary | ICD-10-CM | POA: Diagnosis not present

## 2023-01-23 DIAGNOSIS — R0789 Other chest pain: Secondary | ICD-10-CM | POA: Diagnosis not present

## 2023-02-05 ENCOUNTER — Ambulatory Visit: Payer: PPO | Attending: Cardiology | Admitting: Cardiology

## 2023-02-05 ENCOUNTER — Encounter: Payer: Self-pay | Admitting: Cardiology

## 2023-02-05 VITALS — BP 124/60 | HR 83 | Ht 59.0 in | Wt 157.0 lb

## 2023-02-05 DIAGNOSIS — I110 Hypertensive heart disease with heart failure: Secondary | ICD-10-CM

## 2023-02-05 DIAGNOSIS — E782 Mixed hyperlipidemia: Secondary | ICD-10-CM

## 2023-02-05 DIAGNOSIS — D508 Other iron deficiency anemias: Secondary | ICD-10-CM

## 2023-02-05 DIAGNOSIS — I493 Ventricular premature depolarization: Secondary | ICD-10-CM | POA: Diagnosis not present

## 2023-02-05 DIAGNOSIS — I25119 Atherosclerotic heart disease of native coronary artery with unspecified angina pectoris: Secondary | ICD-10-CM

## 2023-02-05 NOTE — Patient Instructions (Signed)
Medication Instructions:  Your physician recommends that you continue on your current medications as directed. Please refer to the Current Medication list given to you today.  *If you need a refill on your cardiac medications before your next appointment, please call your pharmacy*   Lab Work: None If you have labs (blood work) drawn today and your tests are completely normal, you will receive your results only by: MyChart Message (if you have MyChart) OR A paper copy in the mail If you have any lab test that is abnormal or we need to change your treatment, we will call you to review the results.   Testing/Procedures: None   Follow-Up: At [facility], you and your health needs are our priority.  As part of our continuing mission to provide you with exceptional heart care, we have created designated Provider Care Teams.  These Care Teams include your primary Cardiologist (physician) and Advanced Practice Providers (APPs -  Physician Assistants and Nurse Practitioners) who all work together to provide you with the care you need, when you need it.  We recommend signing up for the patient portal called "MyChart".  Sign up information is provided on this After Visit Summary.  MyChart is used to connect with patients for Virtual Visits (Telemedicine).  Patients are able to view lab/test results, encounter notes, upcoming appointments, etc.  Non-urgent messages can be sent to your provider as well.   To learn more about what you can do with MyChart, go to [email or web address].    Your next appointment:   6 month(s)  Provider:   Brian Munley, MD    Other Instructions None  

## 2023-02-05 NOTE — Progress Notes (Signed)
Cardiology Office Note:    Date:  02/05/2023   ID:  Lindsey Frost, DOB 07/02/1941, MRN [id number]  PCP:  Hurshel Party, NP  Cardiologist:  Norman Herrlich, MD    Referring MD: Hurshel Party, NP    ASSESSMENT:    1. Hypertensive heart disease with heart failure (HCC)   2. Coronary artery disease involving native coronary artery of native heart with angina pectoris (HCC)   3. Frequent PVCs   4. Mixed hyperlipidemia   5. Other iron deficiency anemia    PLAN:    In order of problems listed above:  Overall she has done well stable heart failure no fluid overload and stable CAD no anginal discomfort.  For CAD she will continue aspirin her statin along with oral nitrate and calcium channel blocker Continue her current loop diuretic Continue amlodipine antihypertensive agent No PVCs seen on the EKG today not having palpitation Continue current statin LDL is at target Hemoglobin is normal   Next appointment: 6 months   Medication Adjustments/Labs and Tests Ordered: Current medicines are reviewed at length with the patient today.  Concerns regarding medicines are outlined above.  Orders Placed This Encounter  Procedures   EKG 12-Lead   No orders of the defined types were placed in this encounter.    History of Present Illness:    Lindsey Frost is a 82 y.o. female with a hx of hypertensive heart disease with heart failure CAD hyperlipidemia frequent PVCs and iron deficient [place] anemia last seen 01/16/2022.  She has a history of PCI and Taxus stent to her LAD in 2006 chronic diastolic failure   Compliance with diet, lifestyle and medications: Yes  They are both struggling she had a fall in the garden and fractured her left eighth rib he had a fall also and had a open fracture of his ankle requiring ORIF. She is still sleeping in a recliner she is 3 weeks out from the fracture but the pain is markedly improved She is having no angina edema orthopnea palpitation or  syncope Recent labs 12/04/2022 has a cholesterol 156 LDL is good at 92 A1c elevated 7.8% hemoglobin 14.3 creatinine 1 point potassium 4.50 Past Medical History:  Diagnosis Date   Acute kidney injury (HCC) 09/04/2015   Altered awareness, transient 02/08/2012   Anemia 12/08/2016   Arthritis    OA AND PAIN LEFT KNEE  AND ARTHRITIS IN FINGERS   Atherosclerotic heart disease of native coronary artery without angina pectoris 02/08/2012   Overview:  Overview:  PCI and Taxus stent to LAD 2006  Formatting of this note might be different from the original. PCI and Taxus stent to LAD 2006   Bradycardia with 41-50 beats per minute 08/22/2018   Cancer (HCC)    UTERINE CANCER - S/P HYSTERECTOMY   CHF exacerbation (HCC) 02/08/2012   Chronic diastolic CHF (congestive heart failure) (HCC) 09/04/2015   Chronic diastolic heart failure (HCC) 02/08/2012   Coronary artery disease    Followed by Dr.Akirah Storck   Coronary atherosclerosis of native coronary artery 02/08/2012   Diabetes mellitus    Essential hypertension, benign 02/08/2012   ETD (Eustachian tube dysfunction), right 01/01/2018   Frequent PVCs 12/08/2016   GERD (gastroesophageal reflux disease)    Hiatal hernia 12/26/2019   Hypercalcemia 09/04/2015   Hyperlipidemia    Hypertension    Hypertensive heart disease with heart failure (HCC) 12/19/2015   Left hip pain 09/04/2015   Leukocytosis 09/04/2015   LVH (left ventricular hypertrophy) due to  hypertensive disease, with heart failure (HCC) 12/08/2016   Mixed hyperlipidemia 02/08/2012   Myocardial infarction (HCC) JULY 2006   STENT PLACEMENT [facility]   Occult blood positive stool 12/08/2016   OSA (obstructive sleep apnea) 12/08/2016   Peripheral edema 12/08/2016   S/P left TKA 02/06/2012   SIRS (systemic inflammatory response syndrome) (HCC) 09/04/2015   Sleep apnea    PT HAS CPAP MASK AND MACHINE AT HOME--BUT DOES NOT USE-COULD NOT TOLERATE   Stage 3a chronic kidney disease (HCC) 07/21/2019   Tinnitus of right ear  08/16/2020   Type 2 diabetes mellitus with circulatory disorder (HCC) 09/04/2015    Past Surgical History:  Procedure Laterality Date   ABDOMINAL HYSTERECTOMY  2000   UTERINE CANCER   BILATERAL CATARACT EXTRACTIONS  2005   CORONARY ANGIOPLASTY  2012   LEFT KNEE ARTHROSCOPY  2012   TONSILLECTOMY  1945   TOTAL KNEE ARTHROPLASTY  02/05/2012   Procedure: TOTAL KNEE ARTHROPLASTY;  Surgeon: [name], MD;  Location: [facility];  Service: Orthopedics;  Laterality: Left;   TYMPANOSTOMY TUBE PLACEMENT Right 07/22/2018    Current Medications: Current Meds  Medication Sig   amLODipine (NORVASC) 10 MG tablet Take 10 mg by mouth daily. For blood pressure   aspirin EC 81 MG tablet Take 1 tablet (81 mg total) by mouth daily.   Cholecalciferol-Vitamin C (VITAMIN D3-VITAMIN C PO) Take 1 tablet by mouth daily.   dapagliflozin propanediol (FARXIGA) 10 MG TABS tablet Take 10 mg by mouth daily.   Fe Fum-FA-B Cmp-C-Zn-Mg-Mn-Cu (FERROCITE PLUS) 106-1 MG TABS Take 1 tablet by mouth every other day.   furosemide (LASIX) 20 MG tablet Take 1 tablet (20 mg total) by mouth every other day. Take additional dose on off days if weight 163 lbs. or greater.   glimepiride (AMARYL) 4 MG tablet Take 4 mg by mouth daily with breakfast.    isosorbide mononitrate (IMDUR) 60 MG 24 hr tablet TAKE 1 TABLET(60 MG) BY MOUTH DAILY   JANUMET 50-1000 MG tablet TAKE 1/2 TABLET BY MOUTH IN THE MORNING AND AT BEDTIME   nitroGLYCERIN (NITROSTAT) 0.4 MG SL tablet Place 1 tablet (0.4 mg total) under the tongue every 5 (five) minutes as needed for chest pain.   pantoprazole (PROTONIX) 40 MG tablet Take 40 mg by mouth daily.   pravastatin (PRAVACHOL) 40 MG tablet Take 1 tablet (40 mg total) by mouth daily.   risedronate (ACTONEL) 150 MG tablet Take 150 mg by mouth every 30 (thirty) days.   Semaglutide (RYBELSUS) 3 MG TABS Take 3 mg by mouth in the morning.   zinc gluconate 50 MG tablet Take 50 mg by mouth daily.     Allergies:    Prednisone and Hydralazine hcl   EKGs/Labs/Other Studies Reviewed:    The following studies were reviewed today:  Cardiac Studies & Procedures     STRESS TESTS  MYOCARDIAL PERFUSION IMAGING 12/17/2020  Narrative  The left ventricular ejection fraction is hyperdynamic (>65%).  Nuclear stress EF: 70%.  There was no ST segment deviation noted during stress.  No T wave inversion was noted during stress.  The study is normal.  This is a low risk study.     MONITORS  LONG TERM MONITOR-LIVE TELEMETRY (3-14 DAYS) 11/18/2020  Narrative Patch Wear Time:  13 days and 23 hours (2022-03-18T10:30:47-0400 to 2022-04-01T09:50:27-0400)  Patient had a min HR of 45 bpm, max HR of 188 bpm, and avg HR of 73 bpm. Predominant underlying rhythm was Sinus Rhythm. 2  Ventricular Tachycardia runs occurred, the run with the fastest interval lasting 4 beats with a max rate of 188 bpm, the longest lasting 4 beats with an avg rate of 119 bpm. 16 Supraventricular Tachycardia runs occurred, the run with the fastest interval lasting 5 beats with a max rate of 171 bpm, the longest lasting 16.3 secs with an avg rate of 106 bpm. Isolated SVEs were rare (<1.0%), SVE Couplets were rare (<1.0%), and SVE Triplets were rare (<1.0%). Isolated VEs were frequent (26.5%, [id number]), VE Couplets were rare (<1.0%, 2866), and no VE Triplets were present. Ventricular Bigeminy and Trigeminy were present.  There were 1 triggered and 2 diary events associated with frequent PVCs with bigeminy. Ventricular ectopy was frequent with a PVC burden of 26-1/2% and rare couplets.  Longest episode of bigeminy was 1 hour 32 minutes.  There were 2 episodes of ventricular tachycardia 1 is importantly quality and the other one appears to be PVC conducted QRS and a triplet.  There are no true episodes of ventricular tachycardia  Conclusion frequent PVCs with symptomatic episodes of bigeminy           EKG Interpretation Date/Time:  Monday February 05 2023 15:15:52 EDT Ventricular Rate:  83 PR Interval:  154 QRS Duration:  90 QT Interval:  376 QTC Calculation: 441 R Axis:   -27  Text Interpretation: Normal sinus rhythm Low voltage QRS Left axis deviation Poor R wave progression When compared with ECG of 04-Sep-2015 06:14, PREVIOUS ECG IS PRESENT Confirmed by Norman Herrlich ([id number]) on 02/05/2023 3:24:29 PM EKG Interpretation Date/Time:  Monday February 05 2023 15:15:52 EDT Ventricular Rate:  83 PR Interval:  154 QRS Duration:  90 QT Interval:  376 QTC Calculation: 441 R Axis:   -27  Text Interpretation: Normal sinus rhythm Low voltage QRS Left axis deviation Poor R wave progression When compared with ECG of 04-Sep-2015 06:14, PREVIOUS ECG IS PRESENT Confirmed by Norman Herrlich ([id number]) on 02/05/2023 3:24:29 PM     Physical Exam:    VS:  BP 124/60 (BP Location: Left Arm, Patient Position: Sitting, Cuff Size: Normal)   Pulse 83   Ht 4\' 11"  (1.499 m)   Wt 157 lb (71.2 kg)   SpO2 93%   BMI 31.71 kg/m     Wt Readings from Last 3 Encounters:  02/05/23 157 lb (71.2 kg)  01/16/22 156 lb (70.8 kg)  06/28/21 161 lb 9.6 oz (73.3 kg)     GEN:  Well nourished, well developed in no acute distress HEENT: Normal NECK: No JVD; No carotid bruits LYMPHATICS: No lymphadenopathy CARDIAC: RRR, no murmurs, rubs, gallops RESPIRATORY:  Clear to auscultation without rales, wheezing or rhonchi  ABDOMEN: Soft, non-tender, non-distended MUSCULOSKELETAL:  No edema; No deformity  SKIN: Warm and dry NEUROLOGIC:  Alert and oriented x 3 PSYCHIATRIC:  Normal affect    Signed, Norman Herrlich, MD  02/05/2023 3:38 PM    [facility]

## 2023-03-19 DIAGNOSIS — R509 Fever, unspecified: Secondary | ICD-10-CM | POA: Diagnosis not present

## 2023-03-19 DIAGNOSIS — U071 COVID-19: Secondary | ICD-10-CM | POA: Diagnosis not present

## 2023-03-19 DIAGNOSIS — J069 Acute upper respiratory infection, unspecified: Secondary | ICD-10-CM | POA: Diagnosis not present

## 2023-03-29 DIAGNOSIS — E785 Hyperlipidemia, unspecified: Secondary | ICD-10-CM | POA: Diagnosis not present

## 2023-03-29 DIAGNOSIS — G629 Polyneuropathy, unspecified: Secondary | ICD-10-CM | POA: Diagnosis not present

## 2023-03-29 DIAGNOSIS — D509 Iron deficiency anemia, unspecified: Secondary | ICD-10-CM | POA: Diagnosis not present

## 2023-03-29 DIAGNOSIS — R200 Anesthesia of skin: Secondary | ICD-10-CM | POA: Diagnosis not present

## 2023-03-29 DIAGNOSIS — N1831 Chronic kidney disease, stage 3a: Secondary | ICD-10-CM | POA: Diagnosis not present

## 2023-03-29 DIAGNOSIS — I5032 Chronic diastolic (congestive) heart failure: Secondary | ICD-10-CM | POA: Diagnosis not present

## 2023-03-29 DIAGNOSIS — E1159 Type 2 diabetes mellitus with other circulatory complications: Secondary | ICD-10-CM | POA: Diagnosis not present

## 2023-06-29 ENCOUNTER — Other Ambulatory Visit: Payer: Self-pay | Admitting: Cardiology

## 2023-07-03 ENCOUNTER — Other Ambulatory Visit: Payer: Self-pay | Admitting: Cardiology

## 2023-07-09 DIAGNOSIS — E1159 Type 2 diabetes mellitus with other circulatory complications: Secondary | ICD-10-CM | POA: Diagnosis not present

## 2023-07-09 DIAGNOSIS — R200 Anesthesia of skin: Secondary | ICD-10-CM | POA: Diagnosis not present

## 2023-07-09 DIAGNOSIS — N1831 Chronic kidney disease, stage 3a: Secondary | ICD-10-CM | POA: Diagnosis not present

## 2023-07-09 DIAGNOSIS — I5032 Chronic diastolic (congestive) heart failure: Secondary | ICD-10-CM | POA: Diagnosis not present

## 2023-07-09 DIAGNOSIS — D509 Iron deficiency anemia, unspecified: Secondary | ICD-10-CM | POA: Diagnosis not present

## 2023-07-09 DIAGNOSIS — G629 Polyneuropathy, unspecified: Secondary | ICD-10-CM | POA: Diagnosis not present

## 2023-07-09 DIAGNOSIS — Z6832 Body mass index (BMI) 32.0-32.9, adult: Secondary | ICD-10-CM | POA: Diagnosis not present

## 2023-07-09 DIAGNOSIS — E785 Hyperlipidemia, unspecified: Secondary | ICD-10-CM | POA: Diagnosis not present

## 2023-08-16 DIAGNOSIS — Z1272 Encounter for screening for malignant neoplasm of vagina: Secondary | ICD-10-CM | POA: Diagnosis not present

## 2023-08-16 DIAGNOSIS — Z8542 Personal history of malignant neoplasm of other parts of uterus: Secondary | ICD-10-CM | POA: Diagnosis not present

## 2023-08-16 DIAGNOSIS — Z6831 Body mass index (BMI) 31.0-31.9, adult: Secondary | ICD-10-CM | POA: Diagnosis not present

## 2023-08-16 DIAGNOSIS — Z779 Other contact with and (suspected) exposures hazardous to health: Secondary | ICD-10-CM | POA: Diagnosis not present

## 2023-08-16 DIAGNOSIS — Z1231 Encounter for screening mammogram for malignant neoplasm of breast: Secondary | ICD-10-CM | POA: Diagnosis not present

## 2023-08-21 ENCOUNTER — Encounter: Payer: Self-pay | Admitting: Cardiology

## 2023-08-22 NOTE — Progress Notes (Signed)
Cardiology Office Note:    Date:  08/23/2023   ID:  Lindsey Frost, DOB 03-May-1941, MRN [id number]  PCP:  Hurshel Party, NP  Cardiologist:  Norman Herrlich, MD    Referring MD: Hurshel Party, NP    ASSESSMENT:    1. Hypertensive heart disease with heart failure (HCC)   2. Coronary artery disease involving native coronary artery of native heart with angina pectoris (HCC)   3. Frequent PVCs   4. Mixed hyperlipidemia   5. Other iron deficiency anemia    PLAN:    In order of problems listed above:  Continues to do well with hypertension heart failure with resolution of her anemia she is taking her diuretic as needed and I told was low she was meticulous I think this was a good strategy. BP is at target and continue her current regimen including ARB thiazide diuretic combination Stable CAD no anginal discomfort continue treatment including oral nitrate and pravastatin Hemoglobin is normalized with interval movement of both heart failure and overall health and quality of life   Next appointment: 6 months   Medication Adjustments/Labs and Tests Ordered: Current medicines are reviewed at length with the patient today.  Concerns regarding medicines are outlined above.  No orders of the defined types were placed in this encounter.  No orders of the defined types were placed in this encounter.    History of Present Illness:    Lindsey Kamora Courtney is a 83 y.o. female with a hx of hypertensive heart disease with heart failure CAD with PCI and stent to LAD 2006 frequent PVCs hyperlipidemia and iron deficiency anemia last seen 02/05/2023. Compliance with diet, lifestyle and medications: Yes Overall is done well but in the interim had a fall in her garden fracture left eighth rib with prolonged pain this resolved she was seen at [facility] ED tells me she had a chest x-ray done and had an 8-hour hospital stay. She only takes her diuretic as needed and she is having no edema she has had no  chest pain orthopnea shortness of breath palpitation or syncope Uses a cane no longer does garden work and has not had any further falls Most recent labs 07/09/2023 cholesterol 155 LDL 89 triglycerides 154 hemoglobin 14.8 creatinine 1.18 potassium 4.7 Past Medical History:  Diagnosis Date   Acute kidney injury (HCC) 09/04/2015   Adenomatous polyp of colon 12/24/2019   Altered awareness, transient 02/08/2012   Anemia 12/08/2016   Arthritis    OA AND PAIN LEFT KNEE  AND ARTHRITIS IN FINGERS   Atherosclerotic heart disease of native coronary artery without angina pectoris 02/08/2012   Overview:  Overview:  PCI and Taxus stent to LAD 2006  Formatting of this note might be different from the original. PCI and Taxus stent to LAD 2006   Bradycardia with 41-50 beats per minute 08/22/2018   Cancer (HCC)    UTERINE CANCER - S/P HYSTERECTOMY   CHF exacerbation (HCC) 02/08/2012   Chronic diastolic CHF (congestive heart failure) (HCC) 09/04/2015   Chronic diastolic heart failure (HCC) 02/08/2012   Coronary artery disease    Followed by Dr.Belmont Valli   Coronary atherosclerosis of native coronary artery 02/08/2012   Diabetes mellitus    Essential hypertension, benign 02/08/2012   ETD (Eustachian tube dysfunction), right 01/01/2018   Frequent PVCs 12/08/2016   GERD (gastroesophageal reflux disease)    Hiatal hernia 12/26/2019   Hypercalcemia 09/04/2015   Hyperlipidemia    Hypertension    Hypertensive heart disease with  heart failure (HCC) 12/19/2015   Left hip pain 09/04/2015   Leukocytosis 09/04/2015   LVH (left ventricular hypertrophy) due to hypertensive disease, with heart failure (HCC) 12/08/2016   Malignant neoplasm of uterus (HCC) 12/26/2019   Mixed hyperlipidemia 02/08/2012   Myocardial infarction (HCC) 02/2005   STENT PLACEMENT [facility]   Occult blood positive stool 12/08/2016   OSA (obstructive sleep apnea) 12/08/2016   Peripheral edema 12/08/2016   S/P left TKA 02/06/2012    SIRS (systemic inflammatory response syndrome) (HCC) 09/04/2015   Sleep apnea    PT HAS CPAP MASK AND MACHINE AT HOME--BUT DOES NOT USE-COULD NOT TOLERATE   Stage 3a chronic kidney disease (HCC) 07/21/2019   Tinnitus of right ear 08/16/2020   Type 2 diabetes mellitus with circulatory disorder (HCC) 09/04/2015   Type 2 diabetes mellitus with hyperglycemia, without long-term current use of insulin (HCC) 09/18/2016    Current Medications: Current Meds  Medication Sig   amLODipine (NORVASC) 10 MG tablet Take 10 mg by mouth daily. For blood pressure   aspirin EC 81 MG tablet Take 1 tablet (81 mg total) by mouth daily.   Candesartan Cilexetil-HCTZ 32-25 MG TABS Take 1 tablet by mouth daily.   Cholecalciferol-Vitamin C (VITAMIN D3-VITAMIN C PO) Take 1 tablet by mouth daily.   dapagliflozin propanediol (FARXIGA) 10 MG TABS tablet Take 10 mg by mouth daily.   Fe Fum-FA-B Cmp-C-Zn-Mg-Mn-Cu (FERROCITE PLUS) 106-1 MG TABS Take 1 tablet by mouth every other day.   furosemide (LASIX) 20 MG tablet Take 1 tablet (20 mg total) by mouth every other day. Take additional dose on off days if weight 163 lbs. or greater.   glimepiride (AMARYL) 4 MG tablet Take 4 mg by mouth daily with breakfast.    isosorbide mononitrate (IMDUR) 60 MG 24 hr tablet TAKE 1 TABLET(60 MG) BY MOUTH DAILY   JANUMET 50-1000 MG tablet TAKE 1/2 TABLET BY MOUTH IN THE MORNING AND AT BEDTIME   Lancet Devices (ONETOUCH DELICA PLUS LANCING) MISC 1 Device by Other route as directed.   nitroGLYCERIN (NITROSTAT) 0.4 MG SL tablet Place 1 tablet (0.4 mg total) under the tongue every 5 (five) minutes as needed for chest pain.   pantoprazole (PROTONIX) 40 MG tablet Take 40 mg by mouth daily.   pravastatin (PRAVACHOL) 40 MG tablet Take 1 tablet (40 mg total) by mouth daily.   risedronate (ACTONEL) 150 MG tablet Take 150 mg by mouth every 30 (thirty) days.   Semaglutide (RYBELSUS) 3 MG TABS Take 3 mg by mouth in the morning.   zinc gluconate 50  MG tablet Take 50 mg by mouth daily.      EKGs/Labs/Other Studies Reviewed:    The following studies were reviewed today:         No EKG performed today  Physical Exam:    VS:  BP (!) 140/64   Pulse 77   Ht 4\' 11"  (1.499 m)   Wt 158 lb (71.7 kg)   SpO2 96%   BMI 31.91 kg/m     Wt Readings from Last 3 Encounters:  08/23/23 158 lb (71.7 kg)  02/05/23 157 lb (71.2 kg)  01/16/22 156 lb (70.8 kg)     GEN:  Well nourished, well developed in no acute distress HEENT: Normal NECK: No JVD; No carotid bruits LYMPHATICS: No lymphadenopathy CARDIAC: RRR, no murmurs, rubs, gallops RESPIRATORY:  Clear to auscultation without rales, wheezing or rhonchi  ABDOMEN: Soft, non-tender, non-distended MUSCULOSKELETAL:  No edema; No deformity  SKIN:  Warm and dry NEUROLOGIC:  Alert and oriented x 3 PSYCHIATRIC:  Normal affect    Signed, Norman Herrlich, MD  08/23/2023 10:33 AM    [facility]

## 2023-08-23 ENCOUNTER — Encounter: Payer: Self-pay | Admitting: Cardiology

## 2023-08-23 ENCOUNTER — Ambulatory Visit: Payer: PPO | Attending: Cardiology | Admitting: Cardiology

## 2023-08-23 VITALS — BP 140/64 | HR 77 | Ht 59.0 in | Wt 158.0 lb

## 2023-08-23 DIAGNOSIS — I25119 Atherosclerotic heart disease of native coronary artery with unspecified angina pectoris: Secondary | ICD-10-CM | POA: Diagnosis not present

## 2023-08-23 DIAGNOSIS — I110 Hypertensive heart disease with heart failure: Secondary | ICD-10-CM | POA: Diagnosis not present

## 2023-08-23 DIAGNOSIS — I493 Ventricular premature depolarization: Secondary | ICD-10-CM | POA: Diagnosis not present

## 2023-08-23 DIAGNOSIS — E782 Mixed hyperlipidemia: Secondary | ICD-10-CM

## 2023-08-23 DIAGNOSIS — D508 Other iron deficiency anemias: Secondary | ICD-10-CM | POA: Diagnosis not present

## 2023-08-23 NOTE — Patient Instructions (Signed)

## 2023-09-10 DIAGNOSIS — E119 Type 2 diabetes mellitus without complications: Secondary | ICD-10-CM | POA: Diagnosis not present

## 2023-09-10 DIAGNOSIS — H524 Presbyopia: Secondary | ICD-10-CM | POA: Diagnosis not present

## 2023-10-15 DIAGNOSIS — M810 Age-related osteoporosis without current pathological fracture: Secondary | ICD-10-CM | POA: Diagnosis not present

## 2023-10-15 DIAGNOSIS — N1831 Chronic kidney disease, stage 3a: Secondary | ICD-10-CM | POA: Diagnosis not present

## 2023-10-15 DIAGNOSIS — E1159 Type 2 diabetes mellitus with other circulatory complications: Secondary | ICD-10-CM | POA: Diagnosis not present

## 2023-10-15 DIAGNOSIS — I5032 Chronic diastolic (congestive) heart failure: Secondary | ICD-10-CM | POA: Diagnosis not present

## 2023-10-15 DIAGNOSIS — M25511 Pain in right shoulder: Secondary | ICD-10-CM | POA: Diagnosis not present

## 2023-10-15 DIAGNOSIS — G8929 Other chronic pain: Secondary | ICD-10-CM | POA: Diagnosis not present

## 2023-10-15 DIAGNOSIS — E785 Hyperlipidemia, unspecified: Secondary | ICD-10-CM | POA: Diagnosis not present

## 2023-10-15 DIAGNOSIS — D509 Iron deficiency anemia, unspecified: Secondary | ICD-10-CM | POA: Diagnosis not present

## 2023-10-15 DIAGNOSIS — R200 Anesthesia of skin: Secondary | ICD-10-CM | POA: Diagnosis not present

## 2023-10-15 DIAGNOSIS — G629 Polyneuropathy, unspecified: Secondary | ICD-10-CM | POA: Diagnosis not present

## 2023-10-15 DIAGNOSIS — Z6833 Body mass index (BMI) 33.0-33.9, adult: Secondary | ICD-10-CM | POA: Diagnosis not present

## 2023-11-08 DIAGNOSIS — H6991 Unspecified Eustachian tube disorder, right ear: Secondary | ICD-10-CM | POA: Diagnosis not present

## 2023-12-29 ENCOUNTER — Other Ambulatory Visit: Payer: Self-pay | Admitting: Cardiology

## 2024-01-01 NOTE — Telephone Encounter (Signed)
 Dr. Erma Hay pt. As this is not a Cardiac RX, does Dr. Sandee Crook want to refill? Please advise.

## 2024-01-28 DIAGNOSIS — M25511 Pain in right shoulder: Secondary | ICD-10-CM | POA: Diagnosis not present

## 2024-01-28 DIAGNOSIS — Z6832 Body mass index (BMI) 32.0-32.9, adult: Secondary | ICD-10-CM | POA: Diagnosis not present

## 2024-01-28 DIAGNOSIS — D509 Iron deficiency anemia, unspecified: Secondary | ICD-10-CM | POA: Diagnosis not present

## 2024-01-28 DIAGNOSIS — E785 Hyperlipidemia, unspecified: Secondary | ICD-10-CM | POA: Diagnosis not present

## 2024-01-28 DIAGNOSIS — G629 Polyneuropathy, unspecified: Secondary | ICD-10-CM | POA: Diagnosis not present

## 2024-01-28 DIAGNOSIS — M810 Age-related osteoporosis without current pathological fracture: Secondary | ICD-10-CM | POA: Diagnosis not present

## 2024-01-28 DIAGNOSIS — G8929 Other chronic pain: Secondary | ICD-10-CM | POA: Diagnosis not present

## 2024-01-28 DIAGNOSIS — I2510 Atherosclerotic heart disease of native coronary artery without angina pectoris: Secondary | ICD-10-CM | POA: Diagnosis not present

## 2024-01-28 DIAGNOSIS — R200 Anesthesia of skin: Secondary | ICD-10-CM | POA: Diagnosis not present

## 2024-01-28 DIAGNOSIS — E1159 Type 2 diabetes mellitus with other circulatory complications: Secondary | ICD-10-CM | POA: Diagnosis not present

## 2024-01-28 DIAGNOSIS — N1831 Chronic kidney disease, stage 3a: Secondary | ICD-10-CM | POA: Diagnosis not present

## 2024-01-28 DIAGNOSIS — I5032 Chronic diastolic (congestive) heart failure: Secondary | ICD-10-CM | POA: Diagnosis not present

## 2024-03-06 ENCOUNTER — Ambulatory Visit: Admitting: Cardiology

## 2024-03-06 DIAGNOSIS — N1831 Chronic kidney disease, stage 3a: Secondary | ICD-10-CM | POA: Diagnosis not present

## 2024-03-06 DIAGNOSIS — E1159 Type 2 diabetes mellitus with other circulatory complications: Secondary | ICD-10-CM | POA: Diagnosis not present

## 2024-03-06 DIAGNOSIS — I10 Essential (primary) hypertension: Secondary | ICD-10-CM | POA: Diagnosis not present

## 2024-03-30 NOTE — Progress Notes (Unsigned)
 Cardiology Office Note:    Date:  04/04/2024   ID:  Lindsey Frost, [place] 23-Sep-1940, MRN [id number]  PCP:  Erick Greig LABOR, NP  Cardiologist:  Redell Leiter, MD    Referring MD: Erick Greig LABOR, NP    ASSESSMENT:    1. Hypertensive heart disease with heart failure (HCC)   2. Coronary artery disease involving native coronary artery of native heart with angina pectoris (HCC)   3. Mixed hyperlipidemia   4. Other iron deficiency anemia   5. Frequent PVCs    PLAN:    In order of problems listed above:  My physical exam appears to have mild perhaps mild to moderate aortic stenosis has been 12 years I will recheck an echocardiogram in the office to understand where she is at in the process Stable CAD continue her current medical treatment she has done well near card association class I on good medical therapy including her aspirin  nitroglycerin  as needed and statin. Back on iron for iron deficiency anemia Stable not having PVCs at this time clinically or on EKG BP well-controlled on treatment including ARB thiazide calcium channel blocker   Next appointment: 1 year follow-up   Medication Adjustments/Labs and Tests Ordered: Current medicines are reviewed at length with the patient today.  Concerns regarding medicines are outlined above.  Orders Placed This Encounter  Procedures   EKG 12-Lead   No orders of the defined types were placed in this encounter.    History of Present Illness:    Lindsey Michelena Culmer is a 83 y.o. female with a hx of hypertensive heart disease with heart failure coronary artery disease with PCI and stent to the LAD in 2006 frequent PVCs hyperlipidemia and iron deficiency anemia previously causing decompensated heart failure last seen 08/23/2023.  Her last ejection fraction gated wall 2022 70%. Recent labs 01/29/2024 hemoglobin 14.4 platelets 232,000 creatinine 1.15 GFR 47 cc/min potassium 4.6 cholesterol 136 LDL 76 non-HDL cholesterol 101 iron saturation  13%.  Compliance with diet, lifestyle and medications: Yes  Overall doing well troubled by carpal tunnel especially left hand and she is going to seek out orthopedic surgery Is not having chest pain edema shortness of breath or syncope and she is back on iron with iron deficiency Labs are exceptional lipids are at target EKG is stable Past Medical History:  Diagnosis Date   Acute kidney injury (HCC) 09/04/2015   Adenomatous polyp of colon 12/24/2019   Altered awareness, transient 02/08/2012   Anemia 12/08/2016   Arthritis    OA AND PAIN LEFT KNEE  AND ARTHRITIS IN FINGERS   Atherosclerotic heart disease of native coronary artery without angina pectoris 02/08/2012   Overview:  Overview:  PCI and Taxus stent to LAD 2006  Formatting of this note might be different from the original. PCI and Taxus stent to LAD 2006   Bradycardia with 41-50 beats per minute 08/22/2018   Cancer (HCC)    UTERINE CANCER - S/P HYSTERECTOMY   CHF exacerbation (HCC) 02/08/2012   Chronic diastolic CHF (congestive heart failure) (HCC) 09/04/2015   Chronic diastolic heart failure (HCC) 02/08/2012   Coronary artery disease    Followed by Dr.Marita Burnsed   Coronary atherosclerosis of native coronary artery 02/08/2012   Essential hypertension, benign 02/08/2012   ETD (Eustachian tube dysfunction), right 01/01/2018   Frequent PVCs 12/08/2016   GERD (gastroesophageal reflux disease)    Hiatal hernia 12/26/2019   Hypercalcemia 09/04/2015   Hyperlipidemia    Hypertension    Hypertensive heart disease  with heart failure (HCC) 12/19/2015   Left hip pain 09/04/2015   Leukocytosis 09/04/2015   LVH (left ventricular hypertrophy) due to hypertensive disease, with heart failure (HCC) 12/08/2016   Malignant neoplasm of uterus (HCC) 12/26/2019   Mixed hyperlipidemia 02/08/2012   Myocardial infarction (HCC) 02/2005   STENT PLACEMENT [facility]   Occult blood positive stool 12/08/2016   OSA (obstructive sleep apnea)  12/08/2016   Osteoporosis 12/24/2019   Peripheral edema 12/08/2016   S/P left TKA 02/06/2012   SIRS (systemic inflammatory response syndrome) (HCC) 09/04/2015   Sleep apnea    PT HAS CPAP MASK AND MACHINE AT HOME--BUT DOES NOT USE-COULD NOT TOLERATE   Stage 3a chronic kidney disease (HCC) 07/21/2019   Tinnitus of right ear 08/16/2020   Type 2 diabetes mellitus with circulatory disorder (HCC) 09/04/2015   Type 2 diabetes mellitus with hyperglycemia, without long-term current use of insulin  (HCC) 09/18/2016    Current Medications: Current Meds  Medication Sig   amLODipine  (NORVASC ) 10 MG tablet Take 10 mg by mouth daily. For blood pressure   aspirin  EC 81 MG tablet Take 1 tablet (81 mg total) by mouth daily.   Candesartan Cilexetil-HCTZ 32-25 MG TABS Take 1 tablet by mouth daily.   Cholecalciferol-Vitamin C (VITAMIN D3-VITAMIN C PO) Take 1 tablet by mouth daily.   dapagliflozin propanediol (FARXIGA) 10 MG TABS tablet Take 10 mg by mouth daily.   Fe Fum-FA-B Cmp-C-Zn-Mg-Mn-Cu (FERROCITE PLUS) 106-1 MG TABS Take 1 tablet by mouth every other day.   glimepiride  (AMARYL ) 4 MG tablet Take 4 mg by mouth daily with breakfast.    isosorbide  mononitrate (IMDUR ) 60 MG 24 hr tablet TAKE 1 TABLET(60 MG) BY MOUTH DAILY   JANUMET  50-1000 MG tablet TAKE 1/2 TABLET BY MOUTH IN THE MORNING AND AT BEDTIME   Lancet Devices (ONETOUCH DELICA PLUS LANCING) MISC 1 Device by Other route as directed.   Lancets (ONETOUCH DELICA PLUS LANCET33G) MISC Apply topically 3 (three) times daily.   magnesium  oxide (MAG-OX) 400 (240 Mg) MG tablet Take 400 mg by mouth daily.   nitroGLYCERIN  (NITROSTAT ) 0.4 MG SL tablet Place 1 tablet (0.4 mg total) under the tongue every 5 (five) minutes as needed for chest pain.   ONETOUCH ULTRA test strip daily.   pantoprazole  (PROTONIX ) 40 MG tablet Take 40 mg by mouth daily.   pravastatin  (PRAVACHOL ) 40 MG tablet Take 1 tablet (40 mg total) by mouth daily.   risedronate (ACTONEL) 150 MG  tablet Take 150 mg by mouth every 30 (thirty) days.   RYBELSUS 14 MG TABS Take 1 tablet by mouth every morning.   zinc gluconate 50 MG tablet Take 50 mg by mouth daily.      EKGs/Labs/Other Studies Reviewed:    The following studies were reviewed today:  Cardiac Studies & Procedures   ______________________________________________________________________________________________   STRESS TESTS  MYOCARDIAL PERFUSION IMAGING 12/16/2020  Interpretation Summary  The left ventricular ejection fraction is hyperdynamic (>65%).  Nuclear stress EF: 70%.  There was no ST segment deviation noted during stress.  No T wave inversion was noted during stress.  The study is normal.  This is a low risk study.      MONITORS  LONG TERM MONITOR-LIVE TELEMETRY (3-14 DAYS) 11/18/2020  Narrative Patch Wear Time:  13 days and 23 hours (2022-03-18T10:30:47-0400 to 2022-04-01T09:50:27-0400)  Patient had a min HR of 45 bpm, max HR of 188 bpm, and avg HR of 73 bpm. Predominant underlying rhythm was Sinus Rhythm. 2 Ventricular Tachycardia  runs occurred, the run with the fastest interval lasting 4 beats with a max rate of 188 bpm, the longest lasting 4 beats with an avg rate of 119 bpm. 16 Supraventricular Tachycardia runs occurred, the run with the fastest interval lasting 5 beats with a max rate of 171 bpm, the longest lasting 16.3 secs with an avg rate of 106 bpm. Isolated SVEs were rare (<1.0%), SVE Couplets were rare (<1.0%), and SVE Triplets were rare (<1.0%). Isolated VEs were frequent (26.5%, [id number]), VE Couplets were rare (<1.0%, 2866), and no VE Triplets were present. Ventricular Bigeminy and Trigeminy were present.  There were 1 triggered and 2 diary events associated with frequent PVCs with bigeminy. Ventricular ectopy was frequent with a PVC burden of 26-1/2% and rare couplets.  Longest episode of bigeminy was 1 hour 32 minutes.  There were 2 episodes of ventricular tachycardia 1 is  importantly quality and the other one appears to be PVC conducted QRS and a triplet.  There are no true episodes of ventricular tachycardia  Conclusion frequent PVCs with symptomatic episodes of bigeminy       ______________________________________________________________________________________________      EKG Interpretation Date/Time:  Friday April 04 2024 14:08:53 EDT Ventricular Rate:  72 PR Interval:  138 QRS Duration:  92 QT Interval:  398 QTC Calculation: 435 R Axis:   -20  Text Interpretation: Normal sinus rhythm Low voltage QRS Borderline ECG When compared with ECG of 05-Feb-2023 15:15, No significant change was found Confirmed by Monetta Rogue ([id number]) on 04/04/2024 2:12:29 PM    Physical Exam:    VS:  BP (!) 110/56   Pulse 72   Ht 4' 9 (1.448 m)   Wt 154 lb 9.6 oz (70.1 kg)   BMI 33.46 kg/m     Wt Readings from Last 3 Encounters:  04/04/24 154 lb 9.6 oz (70.1 kg)  08/23/23 158 lb (71.7 kg)  02/05/23 157 lb (71.2 kg)     GEN: Appears her age well nourished, well developed in no acute distress HEENT: Normal NECK: No JVD; No carotid bruits LYMPHATICS: No lymphadenopathy CARDIAC: Grade 2/6 murmur AAS aortic area does not radiate to the carotids RRR, no murmurs, rubs, gallops RESPIRATORY:  Clear to auscultation without rales, wheezing or rhonchi  ABDOMEN: Soft, non-tender, non-distended MUSCULOSKELETAL:  No edema; No deformity  SKIN: Warm and dry NEUROLOGIC:  Alert and oriented x 3 PSYCHIATRIC:  Normal affect    Signed, Rogue Monetta, MD  04/04/2024 2:29 PM    [facility]

## 2024-04-04 ENCOUNTER — Ambulatory Visit: Attending: Cardiology | Admitting: Cardiology

## 2024-04-04 ENCOUNTER — Encounter: Payer: Self-pay | Admitting: Cardiology

## 2024-04-04 VITALS — BP 110/56 | HR 72 | Ht <= 58 in | Wt 154.6 lb

## 2024-04-04 DIAGNOSIS — I110 Hypertensive heart disease with heart failure: Secondary | ICD-10-CM | POA: Diagnosis not present

## 2024-04-04 DIAGNOSIS — I493 Ventricular premature depolarization: Secondary | ICD-10-CM

## 2024-04-04 DIAGNOSIS — E782 Mixed hyperlipidemia: Secondary | ICD-10-CM

## 2024-04-04 DIAGNOSIS — D508 Other iron deficiency anemias: Secondary | ICD-10-CM

## 2024-04-04 DIAGNOSIS — I25119 Atherosclerotic heart disease of native coronary artery with unspecified angina pectoris: Secondary | ICD-10-CM | POA: Diagnosis not present

## 2024-04-04 MED ORDER — NITROGLYCERIN 0.4 MG SL SUBL: 0.4000 mg | SUBLINGUAL_TABLET | SUBLINGUAL | 2 refills | Status: AC | PRN

## 2024-04-04 NOTE — Patient Instructions (Signed)

## 2024-04-06 DIAGNOSIS — E1159 Type 2 diabetes mellitus with other circulatory complications: Secondary | ICD-10-CM | POA: Diagnosis not present

## 2024-04-06 DIAGNOSIS — I10 Essential (primary) hypertension: Secondary | ICD-10-CM | POA: Diagnosis not present

## 2024-04-06 DIAGNOSIS — N1831 Chronic kidney disease, stage 3a: Secondary | ICD-10-CM | POA: Diagnosis not present

## 2024-04-30 ENCOUNTER — Ambulatory Visit: Attending: Cardiology

## 2024-04-30 DIAGNOSIS — I110 Hypertensive heart disease with heart failure: Secondary | ICD-10-CM | POA: Diagnosis not present

## 2024-04-30 DIAGNOSIS — D508 Other iron deficiency anemias: Secondary | ICD-10-CM

## 2024-04-30 DIAGNOSIS — E782 Mixed hyperlipidemia: Secondary | ICD-10-CM

## 2024-04-30 DIAGNOSIS — I25119 Atherosclerotic heart disease of native coronary artery with unspecified angina pectoris: Secondary | ICD-10-CM | POA: Diagnosis not present

## 2024-04-30 DIAGNOSIS — I493 Ventricular premature depolarization: Secondary | ICD-10-CM

## 2024-04-30 LAB — ECHOCARDIOGRAM COMPLETE
AR max vel: 0.92 cm2
AV Area VTI: 0.97 cm2
AV Area mean vel: 0.91 cm2
AV Mean grad: 25 mmHg
AV Peak grad: 44.2 mmHg
Ao pk vel: 3.33 m/s
Area-P 1/2: 3.15 cm2
S' Lateral: 2.6 cm

## 2024-05-01 ENCOUNTER — Other Ambulatory Visit: Payer: Self-pay

## 2024-05-01 ENCOUNTER — Ambulatory Visit: Payer: Self-pay | Admitting: Cardiology

## 2024-05-01 DIAGNOSIS — I359 Nonrheumatic aortic valve disorder, unspecified: Secondary | ICD-10-CM

## 2024-05-05 DIAGNOSIS — N1831 Chronic kidney disease, stage 3a: Secondary | ICD-10-CM | POA: Diagnosis not present

## 2024-05-05 DIAGNOSIS — E1159 Type 2 diabetes mellitus with other circulatory complications: Secondary | ICD-10-CM | POA: Diagnosis not present

## 2024-05-05 DIAGNOSIS — E785 Hyperlipidemia, unspecified: Secondary | ICD-10-CM | POA: Diagnosis not present

## 2024-05-05 DIAGNOSIS — R200 Anesthesia of skin: Secondary | ICD-10-CM | POA: Diagnosis not present

## 2024-05-05 DIAGNOSIS — M159 Polyosteoarthritis, unspecified: Secondary | ICD-10-CM | POA: Diagnosis not present

## 2024-05-05 DIAGNOSIS — I5032 Chronic diastolic (congestive) heart failure: Secondary | ICD-10-CM | POA: Diagnosis not present

## 2024-05-05 DIAGNOSIS — I2510 Atherosclerotic heart disease of native coronary artery without angina pectoris: Secondary | ICD-10-CM | POA: Diagnosis not present

## 2024-05-05 DIAGNOSIS — M810 Age-related osteoporosis without current pathological fracture: Secondary | ICD-10-CM | POA: Diagnosis not present

## 2024-05-05 DIAGNOSIS — D509 Iron deficiency anemia, unspecified: Secondary | ICD-10-CM | POA: Diagnosis not present

## 2024-05-05 DIAGNOSIS — G629 Polyneuropathy, unspecified: Secondary | ICD-10-CM | POA: Diagnosis not present

## 2024-05-05 DIAGNOSIS — Z6831 Body mass index (BMI) 31.0-31.9, adult: Secondary | ICD-10-CM | POA: Diagnosis not present

## 2024-05-06 DIAGNOSIS — N1831 Chronic kidney disease, stage 3a: Secondary | ICD-10-CM | POA: Diagnosis not present

## 2024-05-06 DIAGNOSIS — E1159 Type 2 diabetes mellitus with other circulatory complications: Secondary | ICD-10-CM | POA: Diagnosis not present

## 2024-06-06 DIAGNOSIS — I10 Essential (primary) hypertension: Secondary | ICD-10-CM | POA: Diagnosis not present

## 2024-06-06 DIAGNOSIS — E1159 Type 2 diabetes mellitus with other circulatory complications: Secondary | ICD-10-CM | POA: Diagnosis not present

## 2024-06-06 DIAGNOSIS — N1831 Chronic kidney disease, stage 3a: Secondary | ICD-10-CM | POA: Diagnosis not present

## 2024-06-20 DIAGNOSIS — Z23 Encounter for immunization: Secondary | ICD-10-CM | POA: Diagnosis not present
